# Patient Record
Sex: Male | Born: 1945 | Race: Black or African American | Hispanic: No | Smoking: Current every day smoker
Health system: Southern US, Community
[De-identification: ages and names within clinical notes are randomized; demographics above are authoritative.]

## PROBLEM LIST (undated history)

## (undated) DIAGNOSIS — B1920 Unspecified viral hepatitis C without hepatic coma: Secondary | ICD-10-CM

## (undated) DIAGNOSIS — F4310 Post-traumatic stress disorder, unspecified: Secondary | ICD-10-CM

## (undated) DIAGNOSIS — T884XXA Failed or difficult intubation, initial encounter: Secondary | ICD-10-CM

## (undated) DIAGNOSIS — C61 Malignant neoplasm of prostate: Secondary | ICD-10-CM

## (undated) DIAGNOSIS — C801 Malignant (primary) neoplasm, unspecified: Secondary | ICD-10-CM

## (undated) DIAGNOSIS — J439 Emphysema, unspecified: Secondary | ICD-10-CM

## (undated) DIAGNOSIS — K219 Gastro-esophageal reflux disease without esophagitis: Secondary | ICD-10-CM

## (undated) DIAGNOSIS — I10 Essential (primary) hypertension: Secondary | ICD-10-CM

## (undated) DIAGNOSIS — F1410 Cocaine abuse, uncomplicated: Secondary | ICD-10-CM

## (undated) HISTORY — PX: TRANSURETHRAL RESECTION OF PROSTATE: SHX73

## (undated) HISTORY — PX: LUMBAR SPINE SURGERY: SHX701

## (undated) HISTORY — PX: TONSILLECTOMY: SUR1361

---

## 1998-03-14 ENCOUNTER — Ambulatory Visit (HOSPITAL_COMMUNITY): Admission: RE | Admit: 1998-03-14 | Discharge: 1998-03-14 | Payer: Self-pay | Admitting: Neurosurgery

## 1998-04-12 ENCOUNTER — Ambulatory Visit (HOSPITAL_COMMUNITY): Admission: RE | Admit: 1998-04-12 | Discharge: 1998-04-12 | Payer: Self-pay | Admitting: Neurosurgery

## 1998-08-24 ENCOUNTER — Ambulatory Visit (HOSPITAL_COMMUNITY): Admission: RE | Admit: 1998-08-24 | Discharge: 1998-08-24 | Payer: Self-pay | Admitting: Neurosurgery

## 1998-08-24 ENCOUNTER — Encounter: Payer: Self-pay | Admitting: Neurosurgery

## 1998-10-10 ENCOUNTER — Inpatient Hospital Stay (HOSPITAL_COMMUNITY): Admission: RE | Admit: 1998-10-10 | Discharge: 1998-10-12 | Payer: Self-pay | Admitting: Neurosurgery

## 1998-10-10 ENCOUNTER — Encounter: Payer: Self-pay | Admitting: Neurosurgery

## 1998-11-08 ENCOUNTER — Ambulatory Visit (HOSPITAL_COMMUNITY): Admission: RE | Admit: 1998-11-08 | Discharge: 1998-11-08 | Payer: Self-pay | Admitting: Neurosurgery

## 1998-11-08 ENCOUNTER — Encounter: Payer: Self-pay | Admitting: Neurosurgery

## 1999-01-07 ENCOUNTER — Ambulatory Visit (HOSPITAL_COMMUNITY): Admission: RE | Admit: 1999-01-07 | Discharge: 1999-01-07 | Payer: Self-pay | Admitting: Neurosurgery

## 1999-01-07 ENCOUNTER — Encounter: Payer: Self-pay | Admitting: Neurosurgery

## 2009-08-20 ENCOUNTER — Emergency Department: Payer: Self-pay | Admitting: Emergency Medicine

## 2009-08-20 IMAGING — CR DG THORACIC SPINE 2-3V
1 series · 3 of 3 positions shown · non-contrast
Comparison: none

REASON FOR EXAM: neck and left scapular pain
COMMENTS:

PROCEDURE:     DXR - DXR THORACIC  AP AND LATERAL  - [DATE]  [DATE]
RESULT:     The vertebral body heights and the intervertebral disc spaces
are well maintained. The vertebral body alignment is normal. The pedicles
are bilaterally intact.

[Series 1: view not recorded · 0.17mm/px · 3 of 3 slices shown]
[im 1/3]
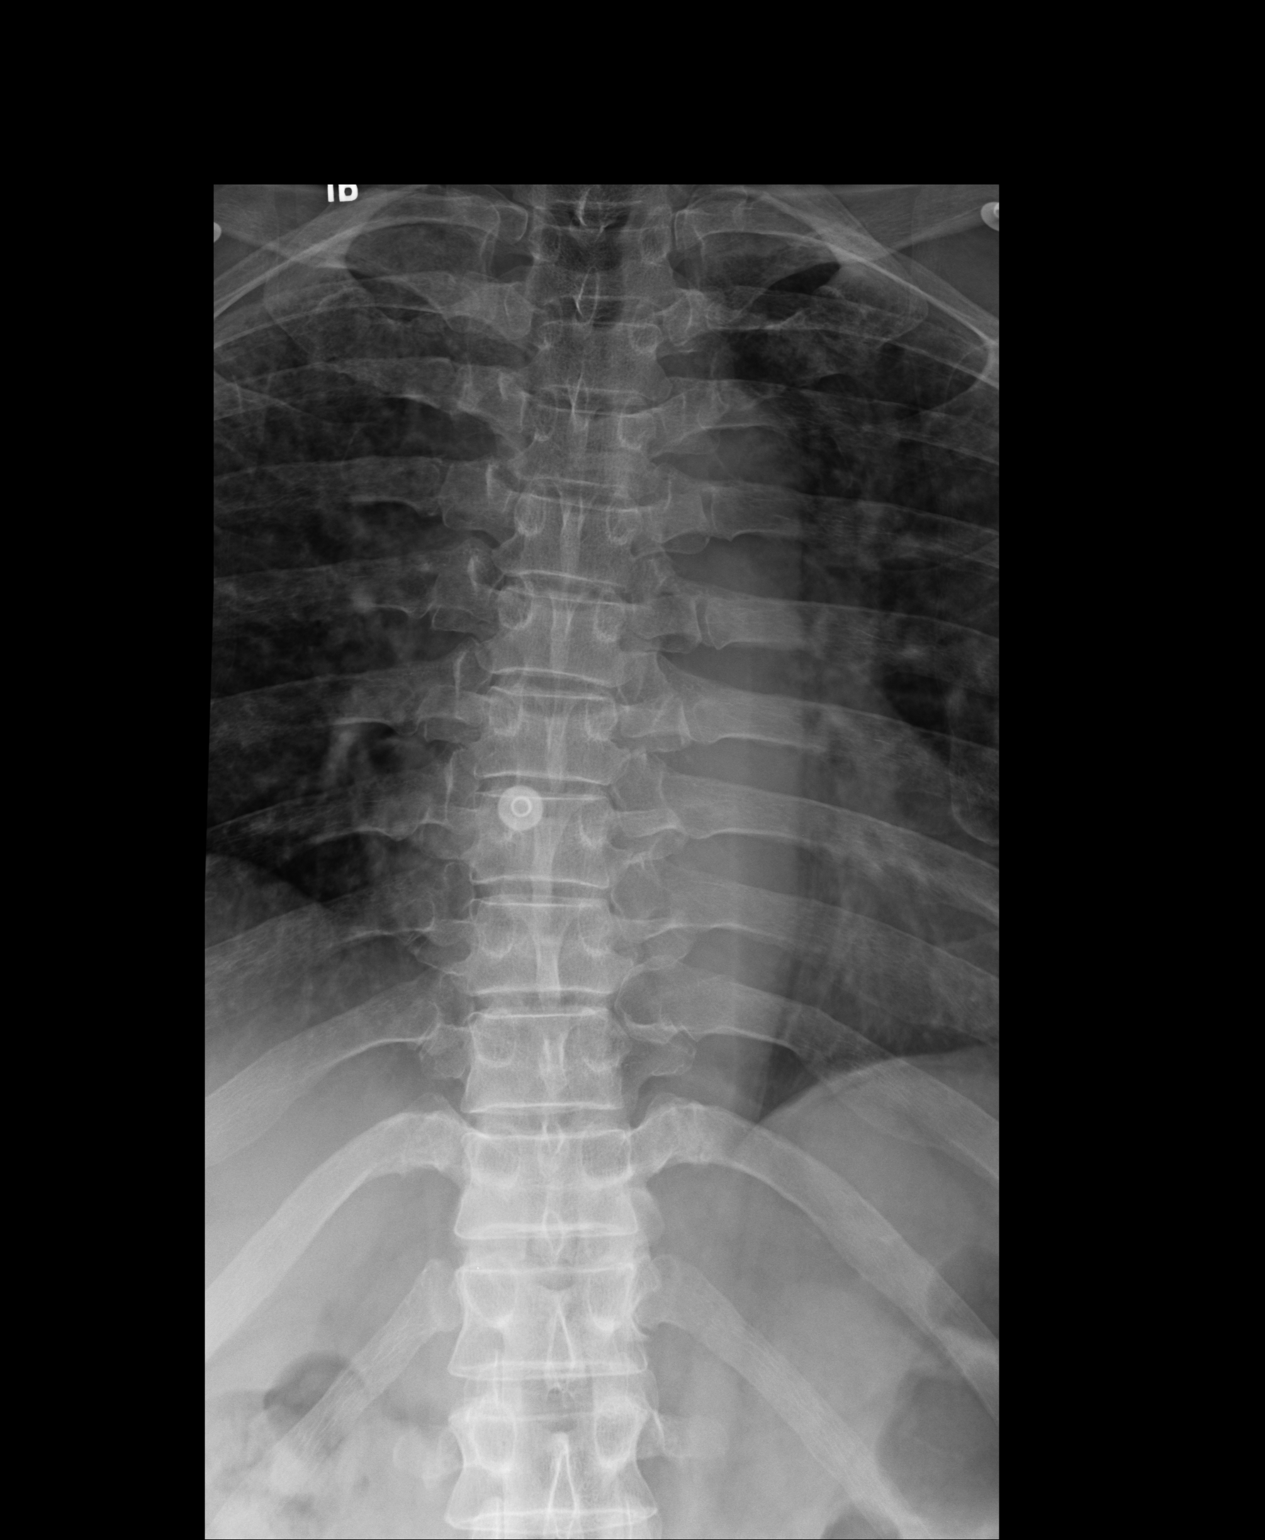
[im 2/3]
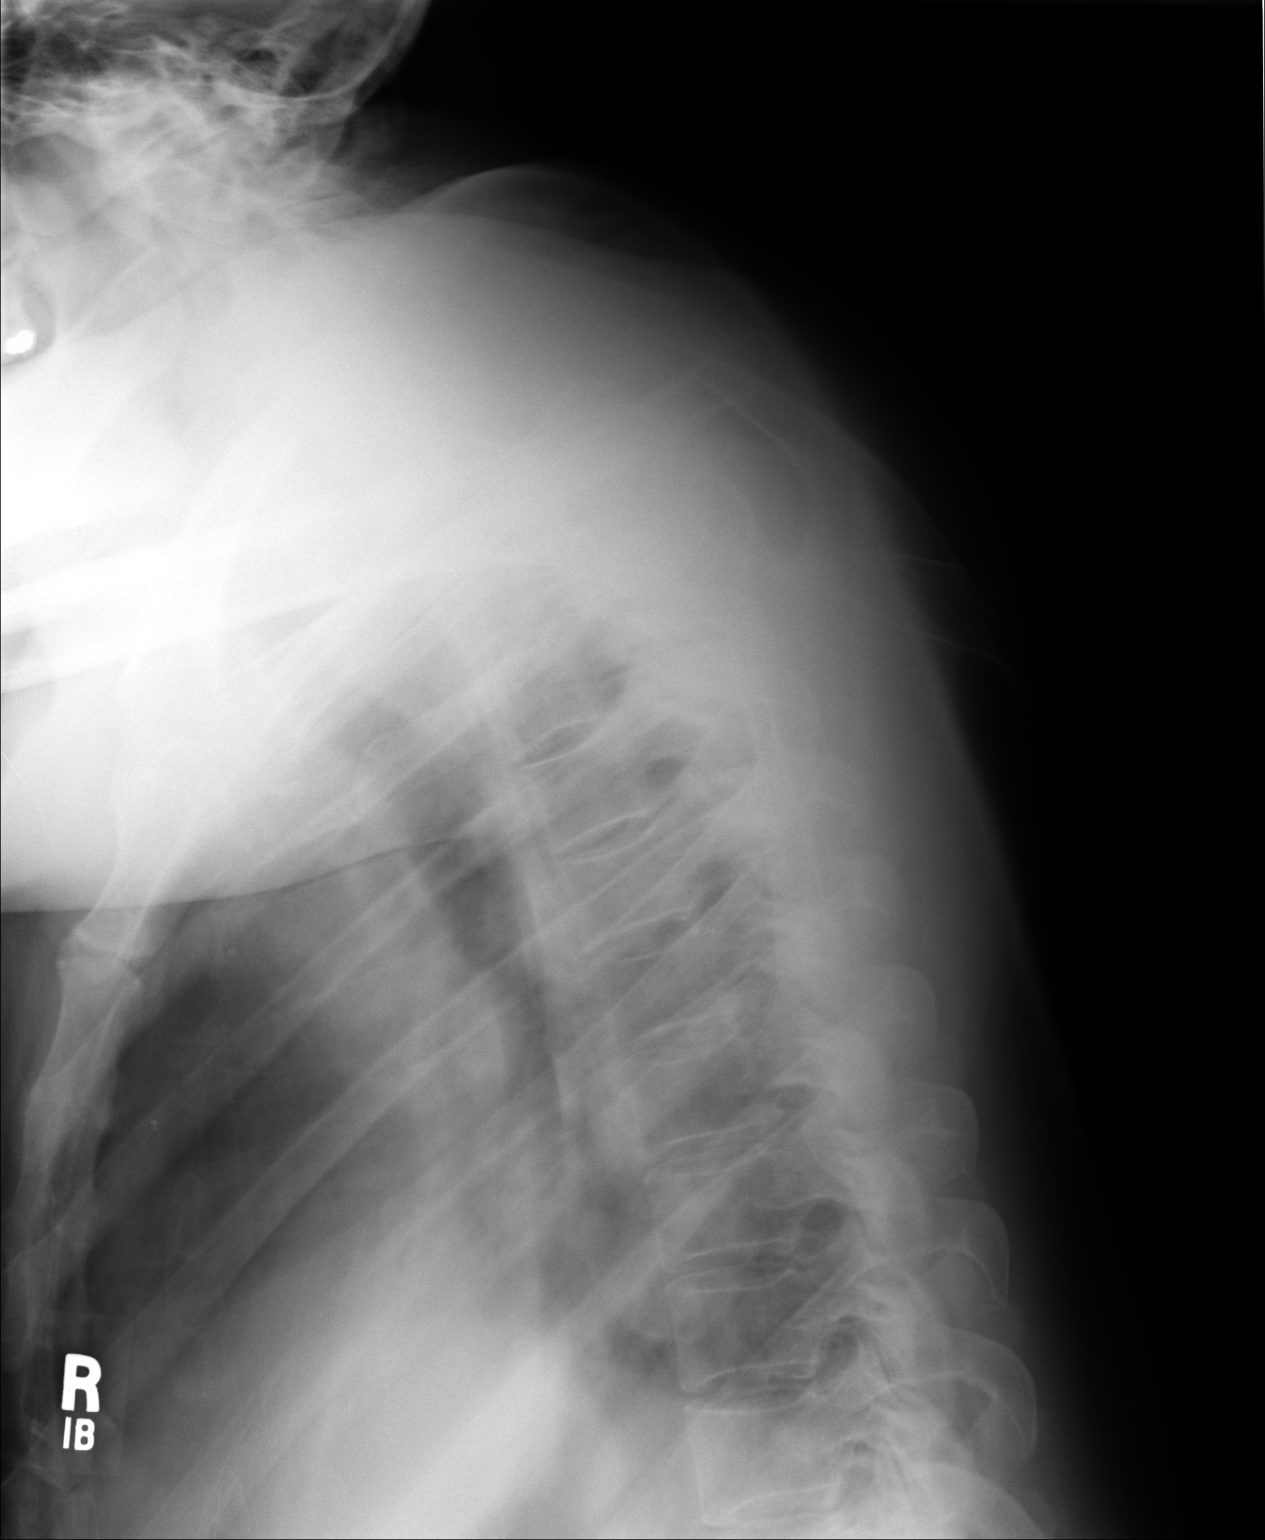
[im 3/3]
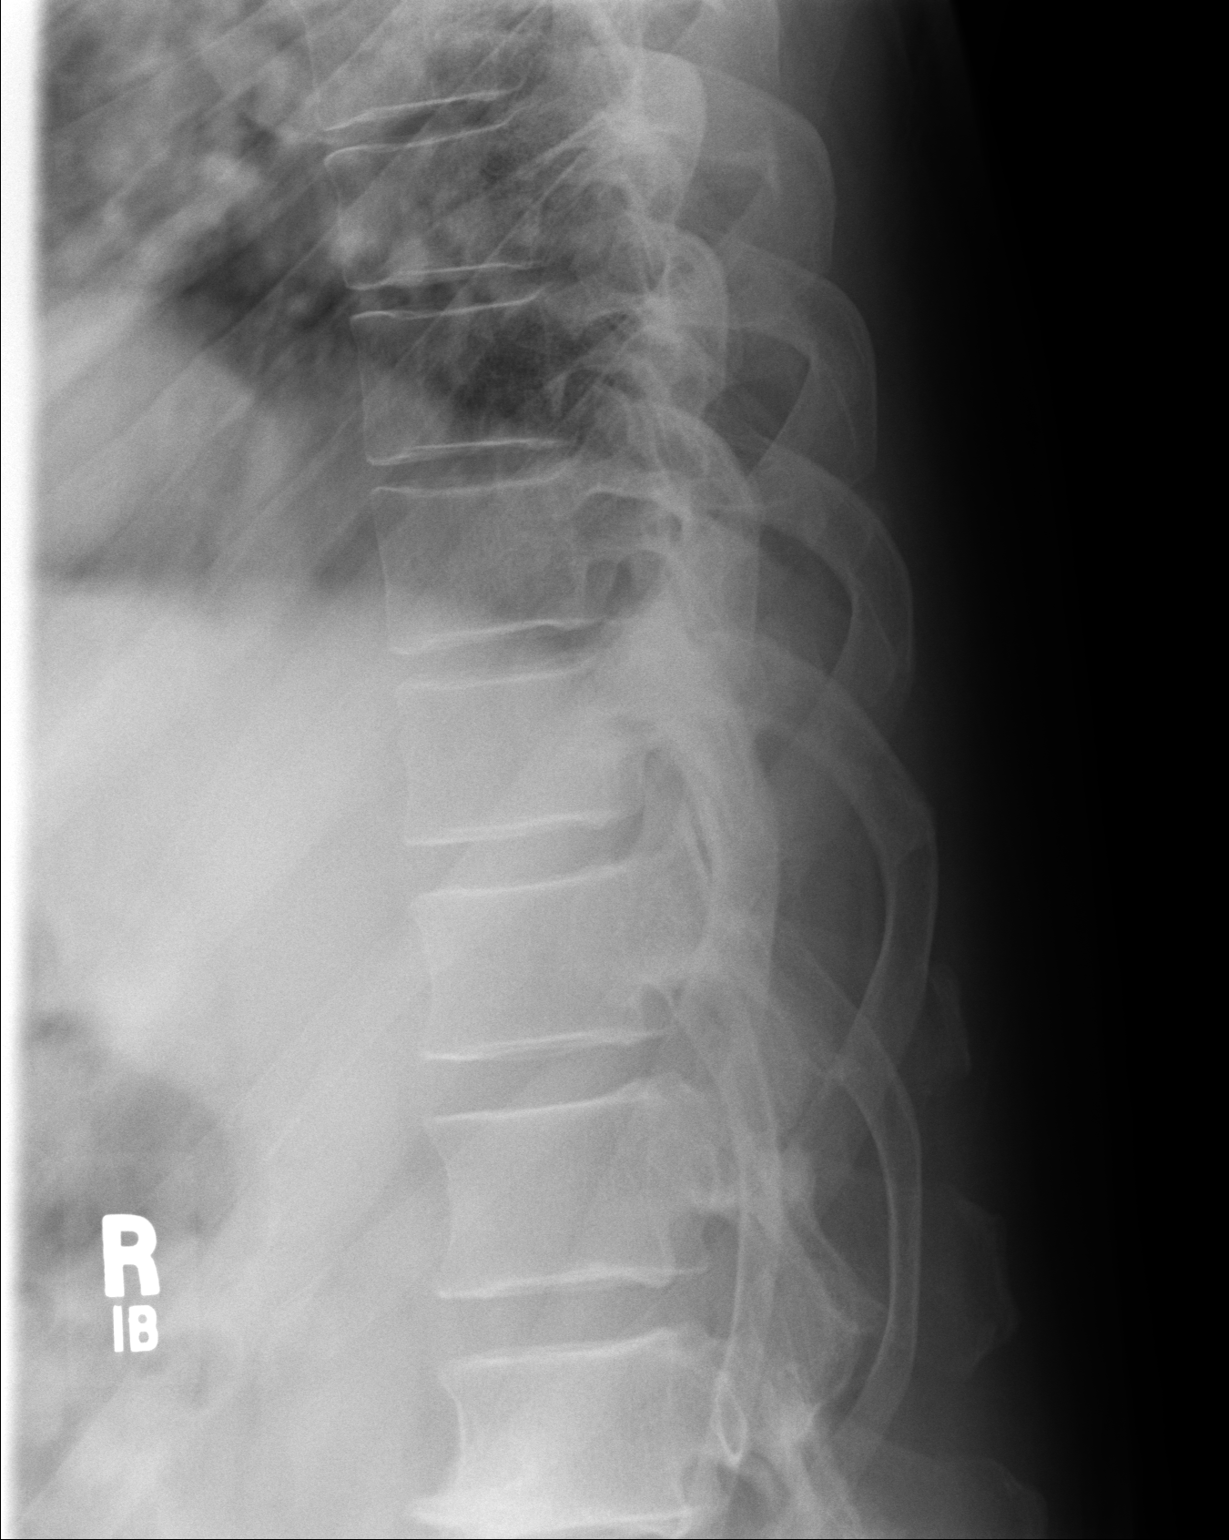

[3 of 3 positions shown; findings below may reference images not displayed]

IMPRESSION: No acute changes are identified.

## 2012-01-19 ENCOUNTER — Emergency Department: Payer: Self-pay | Admitting: Emergency Medicine

## 2012-10-26 ENCOUNTER — Emergency Department: Payer: Self-pay | Admitting: Emergency Medicine

## 2012-11-24 ENCOUNTER — Emergency Department: Payer: Self-pay | Admitting: Emergency Medicine

## 2012-11-24 LAB — URINALYSIS, COMPLETE
Bacteria: NONE SEEN
Nitrite: NEGATIVE
Protein: NEGATIVE
RBC,UR: 1 /HPF (ref 0–5)
Specific Gravity: 1.015 (ref 1.003–1.030)
Squamous Epithelial: NONE SEEN
WBC UR: 1 /HPF (ref 0–5)

## 2012-11-24 LAB — COMPREHENSIVE METABOLIC PANEL
Alkaline Phosphatase: 77 U/L (ref 50–136)
Anion Gap: 6 — ABNORMAL LOW (ref 7–16)
Calcium, Total: 8.3 mg/dL — ABNORMAL LOW (ref 8.5–10.1)
Co2: 25 mmol/L (ref 21–32)
Creatinine: 1.13 mg/dL (ref 0.60–1.30)
EGFR (African American): >60
EGFR (Non-African Amer.): >60
Glucose: 93 mg/dL (ref 65–99)
Osmolality: 282 (ref 275–301)
Potassium: 3.6 mmol/L (ref 3.5–5.1)
SGPT (ALT): 27 U/L (ref 12–78)
Total Protein: 7.6 g/dL (ref 6.4–8.2)

## 2012-11-24 LAB — DRUG SCREEN, URINE
Cannabinoid 50 Ng, Ur ~~LOC~~: NEGATIVE (ref ?–50)
Cocaine Metabolite,Ur ~~LOC~~: POSITIVE (ref ?–300)
MDMA (Ecstasy)Ur Screen: NEGATIVE (ref ?–500)
Methadone, Ur Screen: NEGATIVE (ref ?–300)
Opiate, Ur Screen: NEGATIVE (ref ?–300)
Phencyclidine (PCP) Ur S: NEGATIVE (ref ?–25)
Tricyclic, Ur Screen: NEGATIVE (ref ?–1000)

## 2012-11-24 LAB — ETHANOL: Ethanol %: <0.003 % (ref 0.000–0.080)

## 2012-11-24 LAB — TSH: Thyroid Stimulating Horm: 0.75 u[IU]/mL

## 2012-11-24 LAB — CBC
HGB: 13.5 g/dL (ref 13.0–18.0)
MCH: 27.3 pg (ref 26.0–34.0)
MCHC: 31.9 g/dL — ABNORMAL LOW (ref 32.0–36.0)
MCV: 86 fL (ref 80–100)
Platelet: 204 10*3/uL (ref 150–440)
RBC: 4.93 10*6/uL (ref 4.40–5.90)
RDW: 14.8 % — ABNORMAL HIGH (ref 11.5–14.5)

## 2012-11-24 LAB — SALICYLATE LEVEL: Salicylates, Serum: <1.7 mg/dL

## 2013-05-24 ENCOUNTER — Emergency Department: Payer: Self-pay | Admitting: Emergency Medicine

## 2013-05-24 LAB — COMPREHENSIVE METABOLIC PANEL
Albumin: 3.5 g/dL (ref 3.4–5.0)
Alkaline Phosphatase: 77 U/L (ref 50–136)
Anion Gap: 4 — ABNORMAL LOW (ref 7–16)
BUN: 21 mg/dL — ABNORMAL HIGH (ref 7–18)
Bilirubin,Total: 0.3 mg/dL (ref 0.2–1.0)
Calcium, Total: 8.9 mg/dL (ref 8.5–10.1)
Chloride: 104 mmol/L (ref 98–107)
Co2: 29 mmol/L (ref 21–32)
Creatinine: 1.08 mg/dL (ref 0.60–1.30)
EGFR (African American): >60
EGFR (Non-African Amer.): >60
Glucose: 85 mg/dL (ref 65–99)
Osmolality: 276 (ref 275–301)
Potassium: 3.9 mmol/L (ref 3.5–5.1)
SGOT(AST): 35 U/L (ref 15–37)
SGPT (ALT): 34 U/L (ref 12–78)
Sodium: 137 mmol/L (ref 136–145)
Total Protein: 7.7 g/dL (ref 6.4–8.2)

## 2013-05-24 LAB — URINALYSIS, COMPLETE
Bacteria: NONE SEEN
Bilirubin,UR: NEGATIVE
Blood: NEGATIVE
Glucose,UR: NEGATIVE mg/dL (ref 0–75)
Ketone: NEGATIVE
Leukocyte Esterase: NEGATIVE
Nitrite: NEGATIVE
Ph: 7 (ref 4.5–8.0)
Protein: NEGATIVE
RBC,UR: <1 /HPF (ref 0–5)
Specific Gravity: 1.014 (ref 1.003–1.030)
Squamous Epithelial: <1
WBC UR: NONE SEEN /HPF (ref 0–5)

## 2013-05-24 LAB — CBC
HCT: 41.3 % (ref 40.0–52.0)
HGB: 13.9 g/dL (ref 13.0–18.0)
MCH: 28.7 pg (ref 26.0–34.0)
MCHC: 33.6 g/dL (ref 32.0–36.0)
MCV: 85 fL (ref 80–100)
Platelet: 152 10*3/uL (ref 150–440)
RBC: 4.84 10*6/uL (ref 4.40–5.90)
RDW: 14.2 % (ref 11.5–14.5)
WBC: 8.1 10*3/uL (ref 3.8–10.6)

## 2013-05-24 LAB — TROPONIN I: Troponin-I: <0.02 ng/mL

## 2014-09-21 LAB — URINALYSIS, COMPLETE
BILIRUBIN, UR: NEGATIVE
Bacteria: NONE SEEN
Blood: NEGATIVE
Glucose,UR: NEGATIVE mg/dL (ref 0–75)
Ketone: NEGATIVE
Leukocyte Esterase: NEGATIVE
Nitrite: NEGATIVE
Ph: 6 (ref 4.5–8.0)
Protein: NEGATIVE
SQUAMOUS EPITHELIAL: NONE SEEN
Specific Gravity: 1.021 (ref 1.003–1.030)
WBC UR: 1 /HPF (ref 0–5)

## 2014-09-21 LAB — COMPREHENSIVE METABOLIC PANEL
ALT: 31 U/L
Albumin: 3.4 g/dL (ref 3.4–5.0)
Alkaline Phosphatase: 62 U/L
Anion Gap: 8 (ref 7–16)
BILIRUBIN TOTAL: 0.5 mg/dL (ref 0.2–1.0)
BUN: 17 mg/dL (ref 7–18)
Calcium, Total: 8.5 mg/dL (ref 8.5–10.1)
Chloride: 107 mmol/L (ref 98–107)
Co2: 26 mmol/L (ref 21–32)
Creatinine: 1.15 mg/dL (ref 0.60–1.30)
EGFR (African American): >60
EGFR (Non-African Amer.): >60
Glucose: 100 mg/dL — ABNORMAL HIGH (ref 65–99)
OSMOLALITY: 283 (ref 275–301)
Potassium: 3.9 mmol/L (ref 3.5–5.1)
SGOT(AST): 37 U/L (ref 15–37)
Sodium: 141 mmol/L (ref 136–145)
Total Protein: 8 g/dL (ref 6.4–8.2)

## 2014-09-21 LAB — DRUG SCREEN, URINE
AMPHETAMINES, UR SCREEN: NEGATIVE (ref ?–1000)
BENZODIAZEPINE, UR SCRN: NEGATIVE (ref ?–200)
Barbiturates, Ur Screen: NEGATIVE (ref ?–200)
COCAINE METABOLITE, UR ~~LOC~~: POSITIVE (ref ?–300)
Cannabinoid 50 Ng, Ur ~~LOC~~: NEGATIVE (ref ?–50)
MDMA (Ecstasy)Ur Screen: NEGATIVE (ref ?–500)
Methadone, Ur Screen: NEGATIVE (ref ?–300)
Opiate, Ur Screen: NEGATIVE (ref ?–300)
Phencyclidine (PCP) Ur S: NEGATIVE (ref ?–25)
Tricyclic, Ur Screen: NEGATIVE (ref ?–1000)

## 2014-09-21 LAB — CBC
HCT: 46.2 % (ref 40.0–52.0)
HGB: 15.1 g/dL (ref 13.0–18.0)
MCH: 28.2 pg (ref 26.0–34.0)
MCHC: 32.6 g/dL (ref 32.0–36.0)
MCV: 87 fL (ref 80–100)
Platelet: 208 10*3/uL (ref 150–440)
RBC: 5.34 10*6/uL (ref 4.40–5.90)
RDW: 14.7 % — ABNORMAL HIGH (ref 11.5–14.5)
WBC: 7.4 10*3/uL (ref 3.8–10.6)

## 2014-09-21 LAB — ACETAMINOPHEN LEVEL

## 2014-09-21 LAB — SALICYLATE LEVEL

## 2014-09-21 LAB — ETHANOL

## 2014-09-22 ENCOUNTER — Inpatient Hospital Stay: Payer: Self-pay | Admitting: Psychiatry

## 2014-10-25 DIAGNOSIS — F329 Major depressive disorder, single episode, unspecified: Secondary | ICD-10-CM | POA: Diagnosis not present

## 2014-10-25 DIAGNOSIS — F17210 Nicotine dependence, cigarettes, uncomplicated: Secondary | ICD-10-CM | POA: Diagnosis not present

## 2014-10-25 DIAGNOSIS — G8929 Other chronic pain: Secondary | ICD-10-CM | POA: Diagnosis not present

## 2014-10-25 DIAGNOSIS — T1491 Suicide attempt: Secondary | ICD-10-CM | POA: Diagnosis not present

## 2014-10-25 DIAGNOSIS — F1210 Cannabis abuse, uncomplicated: Secondary | ICD-10-CM | POA: Diagnosis not present

## 2014-10-25 DIAGNOSIS — I10 Essential (primary) hypertension: Secondary | ICD-10-CM | POA: Diagnosis not present

## 2014-10-25 DIAGNOSIS — G894 Chronic pain syndrome: Secondary | ICD-10-CM | POA: Diagnosis not present

## 2014-10-25 DIAGNOSIS — T405X2A Poisoning by cocaine, intentional self-harm, initial encounter: Secondary | ICD-10-CM | POA: Diagnosis not present

## 2014-10-25 DIAGNOSIS — R001 Bradycardia, unspecified: Secondary | ICD-10-CM | POA: Diagnosis not present

## 2014-10-25 DIAGNOSIS — F14280 Cocaine dependence with cocaine-induced anxiety disorder: Secondary | ICD-10-CM | POA: Diagnosis not present

## 2014-10-25 DIAGNOSIS — Z9119 Patient's noncompliance with other medical treatment and regimen: Secondary | ICD-10-CM | POA: Diagnosis not present

## 2014-10-25 DIAGNOSIS — G4700 Insomnia, unspecified: Secondary | ICD-10-CM | POA: Diagnosis not present

## 2014-10-25 DIAGNOSIS — R45851 Suicidal ideations: Secondary | ICD-10-CM | POA: Diagnosis not present

## 2014-10-25 DIAGNOSIS — K219 Gastro-esophageal reflux disease without esophagitis: Secondary | ICD-10-CM | POA: Diagnosis not present

## 2014-10-25 DIAGNOSIS — F39 Unspecified mood [affective] disorder: Secondary | ICD-10-CM | POA: Diagnosis not present

## 2014-10-25 DIAGNOSIS — F4310 Post-traumatic stress disorder, unspecified: Secondary | ICD-10-CM | POA: Diagnosis not present

## 2014-10-25 DIAGNOSIS — G629 Polyneuropathy, unspecified: Secondary | ICD-10-CM | POA: Diagnosis not present

## 2014-10-25 DIAGNOSIS — F1419 Cocaine abuse with unspecified cocaine-induced disorder: Secondary | ICD-10-CM | POA: Diagnosis not present

## 2015-01-19 DIAGNOSIS — R001 Bradycardia, unspecified: Secondary | ICD-10-CM | POA: Diagnosis not present

## 2015-01-19 DIAGNOSIS — F1410 Cocaine abuse, uncomplicated: Secondary | ICD-10-CM | POA: Diagnosis not present

## 2015-01-19 DIAGNOSIS — Z79899 Other long term (current) drug therapy: Secondary | ICD-10-CM | POA: Diagnosis not present

## 2015-01-19 DIAGNOSIS — Z9119 Patient's noncompliance with other medical treatment and regimen: Secondary | ICD-10-CM | POA: Diagnosis not present

## 2015-01-19 DIAGNOSIS — F1210 Cannabis abuse, uncomplicated: Secondary | ICD-10-CM | POA: Diagnosis not present

## 2015-01-19 DIAGNOSIS — I10 Essential (primary) hypertension: Secondary | ICD-10-CM | POA: Diagnosis not present

## 2015-01-19 DIAGNOSIS — F4310 Post-traumatic stress disorder, unspecified: Secondary | ICD-10-CM | POA: Diagnosis not present

## 2015-01-19 DIAGNOSIS — F329 Major depressive disorder, single episode, unspecified: Secondary | ICD-10-CM | POA: Diagnosis not present

## 2015-01-19 DIAGNOSIS — G629 Polyneuropathy, unspecified: Secondary | ICD-10-CM | POA: Diagnosis not present

## 2015-01-19 DIAGNOSIS — G8929 Other chronic pain: Secondary | ICD-10-CM | POA: Diagnosis not present

## 2015-01-19 DIAGNOSIS — F17210 Nicotine dependence, cigarettes, uncomplicated: Secondary | ICD-10-CM | POA: Diagnosis not present

## 2015-01-19 DIAGNOSIS — I444 Left anterior fascicular block: Secondary | ICD-10-CM | POA: Diagnosis not present

## 2015-02-03 NOTE — Consult Note (Signed)
PATIENT NAME:  Timothy Matthews, Timothy Matthews MR#:  [id number] DATE OF BIRTH:  [birth year]  DATE OF CONSULTATION:  09/21/2014  IDENTIFYING INFORMATION AND REASON FOR CONSULTATION: A 69 year old man with a history of cocaine abuse who voluntarily came into the hospital.   CHIEF COMPLAINT: "I've been feeling down."   HISTORY OF PRESENT ILLNESS: Information obtained from the patient and the chart. The patient says that he had been using cocaine for several days straight. His mood has been down and depressed. He has not been able to sleep and not been able to eat. He has been having suicidal thoughts and thought seriously about walking out in front of traffic today. He said he walked 6 miles to come here to the hospital. He feels overwhelmed and negative about himself. Has no support for himself, no place to stay. He is not currently on any treatment for depression. Denies any alcohol abuse.   PAST PSYCHIATRIC HISTORY: He has been evaluated at the Emergency Room here before, but not admitted to the hospital. He says he has been to multiple detoxes including the alcohol and drug abuse treatment center and several [facility]. He says he has never been in a psychiatric hospital for something other than substance abuse related. Never attempted to kill himself but he does have a history of serious suicidal ideation and playing around with the idea of getting hit by a vehicle. No homicidal history. He denies any history of psychotic symptoms.   PAST MEDICAL HISTORY: High blood pressure, also apparently has gastric reflux symptoms, and prostate hypertrophy.   SOCIAL HISTORY: Divorced. Minimal family contact. Closest relative is a sister with whom he does not communicate. He gets a check from the [place] but says he does not have any personal support from anyone. He is a Geologist, engineering.   FAMILY HISTORY: No known family history of mental illness.   CURRENT MEDICATIONS: Amlodipine 10 mg per day, gabapentin 900 mg twice a day,  omeprazole 20 mg a day, Hytrin 20 mg at night.   ALLERGIES: No known drug allergies.   REVIEW OF SYSTEMS: The patient denies any pulmonary or cardiac or GI complaints other than not being hungry recently. He has chronic mild low back pain. Positive suicidal ideation. No hallucinations. No homicidal ideation. Feels depressed.   MENTAL STATUS EXAMINATION: Slightly disheveled man, looks his stated age. Passively cooperative. Eye contact poor. Psychomotor activity slow and sluggish. Speech is quiet, almost whispered, decreased in amount. Affect is blunted and dysphoric. Mood stated as depressed. Thoughts are lucid but slow. No evidence of delusional thinking. Denies auditory or visual hallucinations. Endorses suicidal ideation. No homicidal ideation. Can repeat 3/3 objects immediately, remembers 3/3 at 3 minutes. He is alert and oriented x 4. Judgment and insight adequate. Intelligence appears to be normal.   VITAL SIGNS: Blood pressure his 149/93, respirations 20, pulse 67, temperature 98.4.   LABORATORY RESULTS: Drug screen is positive for cocaine only. Alcohol level negative. Chemistry panel unremarkable. CBC totally normal. Urinalysis unremarkable.   ASSESSMENT: A 69 year old man who is currently reporting multiple symptoms of major depression including depressed mood, poor sleep, poor appetite, persistent negative thoughts about himself, and active suicidal ideation with a specific plan. He does not have any psychotic symptoms. All of this is in the context of major life stresses including heavy recent cocaine use, homelessness, lack of support. The patient is a service-connected veteran but meets criteria for inpatient treatment.   TREATMENT PLAN: [facility].  The patient will be admitted voluntarily to the psychiatry hospital here for treatment of major depression. Continue usual outpatient medications. Initiate Remeron 15 mg at night for mood and  sleep. Suicide precautions in place.   DIAGNOSIS, PRINCIPAL AND PRIMARY:  AXIS I: Major depression, recurrent, severe.   SECONDARY DIAGNOSES: AXIS I: Cocaine abuse, severe.  AXIS II: Deferred.  AXIS III: High blood pressure, gastric reflux symptoms, prostate hypertrophy.     ____________________________ Gonzella Lex, MD jtc:at D: 09/21/2014 18:09:00 ET T: 09/21/2014 18:18:00 ET JOB#: [id number]  cc: Gonzella Lex, MD, <Dictator> Gonzella Lex MD ELECTRONICALLY SIGNED 09/24/2014 11:24

## 2015-02-03 NOTE — H&P (Signed)
PATIENT NAME:  Timothy Matthews, Timothy Matthews MR#:  [id number] DATE OF BIRTH:  1946/05/30  DATE OF ADMISSION:  09/22/2014  IDENTIFYING INFORMATION AND REASON FOR CONSULTATION: A 69 year old man with a history of cocaine abuse and depression who came to the hospital seeking treatment for mood.   HISTORY OF PRESENT ILLNESS: Information obtained from the patient and the chart. He came to the hospital stating that he had been using cocaine for several days straight. He describes his mood as being extremely down. He was having active suicidal thoughts, with thoughts about walking out in front of traffic. Not eating or sleeping for days. Feeling overwhelmed and negative about himself. Does not report acute psychotic symptoms. Not abusing alcohol, not getting any psychiatric treatment.   PAST PSYCHIATRIC HISTORY: Has been evaluated in the Emergency Room in the past, but has not had admission to our hospital, but has had detox's at [facility]. Says he has never been in a hospital psychiatrically for anything but substance abuse. No history of suicide attempts.   PAST MEDICAL HISTORY: High blood pressure, gastric reflux symptoms, enlarged prostate.   SOCIAL HISTORY: Divorced, has minimal family contact. Gets a check from the [place], but does not have any personal support from anyone.   FAMILY HISTORY: No known family history of mental illness.   CURRENT MEDICATIONS: Amlodipine 10 mg a day, gabapentin 900 mg twice a day, omeprazole 20 mg a day, Hytrin 20 mg at night.   ALLERGIES: No known drug allergies.   SUBSTANCE ABUSE HISTORY: Long history of cocaine dependence. He says he has been able to have some sobriety at times, but not recently very much.   REVIEW OF SYSTEMS: Today, the patient says he is still feeling depressed. Says he still has occasional suicidal thoughts. Affect remains blunted. Physically, he has no new complaints.   MENTAL STATUS EXAMINATION: Casually groomed gentleman who looks his stated age.  Cooperative, but passive with the interview. Eye contact only intermittent. Psychomotor activity slow. Speech decreased in total amount. Affect grumpy and irritable. Mood stated as being depressed. Thoughts are lucid. No evidence of delusions. Denies auditory or visual hallucinations. Endorses passive suicidal thoughts with no plan. No homicidal ideation. Alert and oriented x 4. Can remember 3/3 objects immediately; 3/3 at 3 minutes. Judgment and insight adequate. Intelligence normal.   PHYSICAL EXAMINATION:  GENERAL: The patient appears to be in no acute physical distress.  SKIN: No skin lesions identified. EXTREMITIES: Gait is normal. Moves all extremities normally.  HEENT: No facial asymmetry.  NEUROLOGIC: Strength and reflexes normal and symmetric throughout upper and lower. Cranial nerves symmetric and normal.  LUNGS: Clear, without wheezes.  HEART: Regular rate and rhythm.  ABDOMEN: Soft, nontender, normal bowel sounds. Temperature 97.7, pulse 62, respirations 18, blood pressure 123/75.   LABORATORY RESULTS: Salicylates and acetaminophen negative. Drug screen positive for cocaine. Urinalysis unremarkable. Chemistry panel and CBC all unremarkable.   ASSESSMENT: A 69 year old man with cocaine abuse and major depression, presented stating he was having suicidal ideation. Remains dysphoric and depressed with a grumpy attitude and reports of passive suicidal ideation, but has not engaged in any dangerous behavior here in the hospital. Mood is still stated as being depressed. The patient was admitted to the hospital for suicidality.   TREATMENT PLAN: I am going to increase the mirtazapine to 30 mg at night for depression. Continue his pantoprazole, Hytrin, gabapentin and amlodipine. Counseling and psychoeducation done. Strongly encourage attendance at groups and involvement on the unit.   DIAGNOSIS, PRINCIPAL AND  PRIMARY: Major depression, severe, single.   SECONDARY DIAGNOSES:  AXIS I: Cocaine  abuse, severe.  AXIS II: Deferred.  AXIS III: High blood pressure, chronic pain.    ____________________________ Gonzella Lex, MD jtc:MT D: 09/23/2014 13:01:23 ET T: 09/23/2014 13:11:56 ET JOB#: [id number]  cc: Gonzella Lex, MD, <Dictator> Gonzella Lex MD ELECTRONICALLY SIGNED 09/24/2014 11:26

## 2015-03-23 DIAGNOSIS — F1914 Other psychoactive substance abuse with psychoactive substance-induced mood disorder: Secondary | ICD-10-CM | POA: Diagnosis not present

## 2015-03-23 DIAGNOSIS — F1490 Cocaine use, unspecified, uncomplicated: Secondary | ICD-10-CM | POA: Diagnosis not present

## 2015-03-24 DIAGNOSIS — F1914 Other psychoactive substance abuse with psychoactive substance-induced mood disorder: Secondary | ICD-10-CM | POA: Diagnosis not present

## 2015-04-03 DIAGNOSIS — F1914 Other psychoactive substance abuse with psychoactive substance-induced mood disorder: Secondary | ICD-10-CM | POA: Diagnosis not present

## 2015-04-04 DIAGNOSIS — J984 Other disorders of lung: Secondary | ICD-10-CM | POA: Diagnosis not present

## 2015-05-22 DIAGNOSIS — I499 Cardiac arrhythmia, unspecified: Secondary | ICD-10-CM | POA: Diagnosis not present

## 2015-05-22 DIAGNOSIS — F1414 Cocaine abuse with cocaine-induced mood disorder: Secondary | ICD-10-CM | POA: Diagnosis not present

## 2015-05-25 DIAGNOSIS — F329 Major depressive disorder, single episode, unspecified: Secondary | ICD-10-CM | POA: Diagnosis not present

## 2015-05-25 DIAGNOSIS — F1490 Cocaine use, unspecified, uncomplicated: Secondary | ICD-10-CM | POA: Diagnosis not present

## 2015-05-25 DIAGNOSIS — R45851 Suicidal ideations: Secondary | ICD-10-CM | POA: Diagnosis not present

## 2015-10-14 HISTORY — PX: CERVICAL SPINE SURGERY: SHX589

## 2016-06-05 ENCOUNTER — Emergency Department: Payer: Medicare Other

## 2016-06-05 DIAGNOSIS — K5900 Constipation, unspecified: Secondary | ICD-10-CM | POA: Diagnosis not present

## 2016-06-05 DIAGNOSIS — R309 Painful micturition, unspecified: Secondary | ICD-10-CM | POA: Diagnosis present

## 2016-06-05 DIAGNOSIS — I10 Essential (primary) hypertension: Secondary | ICD-10-CM | POA: Diagnosis not present

## 2016-06-05 DIAGNOSIS — Z8546 Personal history of malignant neoplasm of prostate: Secondary | ICD-10-CM | POA: Diagnosis not present

## 2016-06-05 DIAGNOSIS — N410 Acute prostatitis: Secondary | ICD-10-CM | POA: Diagnosis not present

## 2016-06-05 DIAGNOSIS — K4090 Unilateral inguinal hernia, without obstruction or gangrene, not specified as recurrent: Secondary | ICD-10-CM | POA: Diagnosis not present

## 2016-06-05 LAB — URINALYSIS COMPLETE WITH MICROSCOPIC (ARMC ONLY)
BACTERIA UA: NONE SEEN
BILIRUBIN URINE: NEGATIVE
Glucose, UA: NEGATIVE mg/dL
HGB URINE DIPSTICK: NEGATIVE
KETONES UR: NEGATIVE mg/dL
LEUKOCYTES UA: NEGATIVE
NITRITE: NEGATIVE
PH: 8 (ref 5.0–8.0)
Protein, ur: NEGATIVE mg/dL
SPECIFIC GRAVITY, URINE: 1.014 (ref 1.005–1.030)

## 2016-06-05 NOTE — ED Triage Notes (Signed)
Pt has not had BM x 3 days and also having burning on urination x 3 days, denies any discharge from penis.

## 2016-06-06 ENCOUNTER — Emergency Department: Payer: Medicare Other

## 2016-06-06 ENCOUNTER — Emergency Department
Admission: EM | Admit: 2016-06-06 | Discharge: 2016-06-06 | Disposition: A | Discharge status: Home / Self Care | Payer: Medicare Other | Attending: Emergency Medicine | Admitting: Emergency Medicine

## 2016-06-06 DIAGNOSIS — N410 Acute prostatitis: Secondary | ICD-10-CM

## 2016-06-06 DIAGNOSIS — K4090 Unilateral inguinal hernia, without obstruction or gangrene, not specified as recurrent: Secondary | ICD-10-CM | POA: Diagnosis not present

## 2016-06-06 HISTORY — DX: Essential (primary) hypertension: I10

## 2016-06-06 HISTORY — DX: Malignant (primary) neoplasm, unspecified: C80.1

## 2016-06-06 LAB — CBC
HCT: 37.3 % — ABNORMAL LOW (ref 40.0–52.0)
HEMOGLOBIN: 12.4 g/dL — AB (ref 13.0–18.0)
MCH: 28.5 pg (ref 26.0–34.0)
MCHC: 33.3 g/dL (ref 32.0–36.0)
MCV: 85.7 fL (ref 80.0–100.0)
Platelets: 175 10*3/uL (ref 150–440)
RBC: 4.36 MIL/uL — ABNORMAL LOW (ref 4.40–5.90)
RDW: 15.1 % — AB (ref 11.5–14.5)
WBC: 7.6 10*3/uL (ref 3.8–10.6)

## 2016-06-06 LAB — COMPREHENSIVE METABOLIC PANEL
ALBUMIN: 3.4 g/dL — AB (ref 3.5–5.0)
ALT: 11 U/L — ABNORMAL LOW (ref 17–63)
ANION GAP: 7 (ref 5–15)
AST: 19 U/L (ref 15–41)
Alkaline Phosphatase: 65 U/L (ref 38–126)
BUN: 14 mg/dL (ref 6–20)
CO2: 23 mmol/L (ref 22–32)
Calcium: 8.7 mg/dL — ABNORMAL LOW (ref 8.9–10.3)
Chloride: 105 mmol/L (ref 101–111)
Creatinine, Ser: 1.04 mg/dL (ref 0.61–1.24)
GFR calc Af Amer: >60 mL/min (ref >60)
GFR calc non Af Amer: >60 mL/min (ref >60)
GLUCOSE: 92 mg/dL (ref 65–99)
POTASSIUM: 3.7 mmol/L (ref 3.5–5.1)
SODIUM: 135 mmol/L (ref 135–145)
Total Bilirubin: 0.3 mg/dL (ref 0.3–1.2)
Total Protein: 7.4 g/dL (ref 6.5–8.1)

## 2016-06-06 MED ORDER — MORPHINE SULFATE (PF) 4 MG/ML IV SOLN
INTRAVENOUS | Status: AC
  Administered 2016-06-06: 4 mg via INTRAVENOUS
  Filled 2016-06-06: qty 1

## 2016-06-06 MED ORDER — OXYCODONE-ACETAMINOPHEN 5-325 MG PO TABS: 1.0000 | ORAL_TABLET | ORAL | 0 refills | Status: DC | PRN

## 2016-06-06 MED ORDER — MAGNESIUM CITRATE PO SOLN
1.0000 | Freq: Once | ORAL | Status: AC
Start: 2016-06-06 — End: 2016-06-06
  Administered 2016-06-06: 1 via ORAL
  Filled 2016-06-06: qty 296

## 2016-06-06 MED ORDER — MORPHINE SULFATE (PF) 4 MG/ML IV SOLN
4.0000 mg | Freq: Once | INTRAVENOUS | Status: AC
  Administered 2016-06-06: 4 mg via INTRAVENOUS

## 2016-06-06 MED ORDER — SULFAMETHOXAZOLE-TRIMETHOPRIM 800-160 MG PO TABS: 1.0000 | ORAL_TABLET | Freq: Two times a day (BID) | ORAL | 0 refills | Status: AC

## 2016-06-06 MED ORDER — KETOROLAC TROMETHAMINE 30 MG/ML IJ SOLN
30.0000 mg | Freq: Once | INTRAMUSCULAR | Status: AC
  Administered 2016-06-06: 30 mg via INTRAVENOUS
  Filled 2016-06-06: qty 1

## 2016-06-06 MED ORDER — DIATRIZOATE MEGLUMINE & SODIUM 66-10 % PO SOLN
15.0000 mL | Freq: Once | ORAL | Status: AC
  Administered 2016-06-06: 15 mL via ORAL

## 2016-06-06 MED ORDER — FLEET ENEMA 7-19 GM/118ML RE ENEM
1.0000 | ENEMA | Freq: Once | RECTAL | Status: AC
  Administered 2016-06-06: 1 via RECTAL

## 2016-06-06 MED ORDER — IOPAMIDOL (ISOVUE-300) INJECTION 61%
100.0000 mL | Freq: Once | INTRAVENOUS | Status: AC | PRN
  Administered 2016-06-06: 100 mL via INTRAVENOUS

## 2016-06-06 NOTE — ED Notes (Signed)
Discharge instructions reviewed with patient. Questions fielded by this RN. Patient verbalizes understanding of instructions. Patient discharged home in stable condition per Brown MD . No acute distress noted at time of discharge.   

## 2016-06-06 NOTE — ED Provider Notes (Signed)
[facility] Emergency Department Provider Note _   First MD Initiated Contact with Patient 06/06/16 [phone]     (approximate)  I have reviewed the triage vital signs and the nursing notes.   HISTORY  Chief Complaint Urinary Urgency    HPI Timothy Matthews is a 70 y.o. male with history of prostate cancer status post radiation therapy 3 weeks ago presents with 10 out of 10 burning with urination 3 days. Patient also admits to constipation 3 days. Patient denies any fever afebrile on presentation with temperature 98.3. Patient denies any vomiting.   Past Medical History:  Diagnosis Date  . Cancer ([place])   . Hypertension     There are no active problems to display for this patient.   No past surgical history on file.  Prior to Admission medications   Not on File    Allergies Review of patient's allergies indicates no known allergies.  No family history on file.  Social History Social History  Substance Use Topics  . Smoking status: Not on file  . Smokeless tobacco: Not on file  . Alcohol use Not on file    Review of Systems Constitutional: No fever/chills Eyes: No visual changes. ENT: No sore throat. Cardiovascular: Denies chest pain. Respiratory: Denies shortness of breath. Gastrointestinal: Positive for abdominal pain  No nausea, no vomiting.  No diarrhea.  No constipation. Genitourinary: Negative for dysuria. Musculoskeletal: Negative for back pain. Skin: Negative for rash. Neurological: Negative for headaches, focal weakness or numbness.  10-point ROS otherwise negative.  ____________________________________________   PHYSICAL EXAM:  VITAL SIGNS: ED Triage Vitals  Enc Vitals Group     BP 06/05/16 2311 (!) 167/92     Pulse Rate 06/05/16 2311 64     Resp 06/05/16 2311 18     Temp 06/05/16 2311 98.3 F (36.8 C)     Temp Source 06/05/16 2311 Oral     SpO2 06/05/16 2311 98 %     Weight 06/05/16 2311 180 lb (81.6 kg)    Height 06/05/16 2311 5\' 11"  (1.803 m)     Head Circumference --      Peak Flow --      Pain Score 06/05/16 2312 10     Pain Loc --      Pain Edu? --      Excl. in [place]? --     Constitutional: Alert and oriented. Well appearing and in no acute distress. Eyes: Conjunctivae are normal. PERRL. EOMI. Head: Atraumatic. Mouth/Throat: Mucous membranes are moist.  Oropharynx non-erythematous. Neck: No stridor.  No meningeal signs.  Cardiovascular: Normal rate, regular rhythm. Good peripheral circulation. Grossly normal heart sounds. Respiratory: Normal respiratory effort.  No retractions. Lungs CTAB. Gastrointestinal: Super pubic tenderness to palpation.. No distention. Rectal exam revealed tenderness to palpation of the prostate. Musculoskeletal: No lower extremity tenderness nor edema. No gross deformities of extremities. Neurologic:  Normal speech and language. No gross focal neurologic deficits are appreciated.  Skin:  Skin is warm, dry and intact. No rash noted. Psychiatric: Mood and affect are normal. Speech and behavior are normal.  ____________________________________________   LABS (all labs ordered are listed, but only abnormal results are displayed)  Labs Reviewed  URINALYSIS COMPLETEWITH MICROSCOPIC ([facility] ONLY) - Abnormal; Notable for the following:       Result Value   Color, Urine YELLOW (*)    APPearance HAZY (*)    Squamous Epithelial / LPF 0-5 (*)    All other components within normal limits  CBC - Abnormal; Notable for the following:    RBC 4.36 (*)    Hemoglobin 12.4 (*)    HCT 37.3 (*)    RDW 15.1 (*)    All other components within normal limits  COMPREHENSIVE METABOLIC PANEL - Abnormal; Notable for the following:    Calcium 8.7 (*)    Albumin 3.4 (*)    ALT 11 (*)    All other components within normal limits     RADIOLOGY I, Keyes N Trella Thurmond, personally viewed and evaluated these images (plain radiographs) as part of my medical decision making, as well as  reviewing the written report by the radiologist.  Dg Abdomen 1 View  Result Date: 06/05/2016 CLINICAL DATA:  Constipation EXAM: ABDOMEN - 1 VIEW COMPARISON:  None. FINDINGS: Moderate stool volume without rectal impaction or obstruction. No concerning mass effect or calcification. Cardiomegaly and interstitial coarsening that is chronic appearing. Lumbar disc degeneration with lower lumbar fusion. IMPRESSION: Moderate stool volume without obstruction or impaction. Electronically Signed   By: Monte Fantasia M.D.   On: 06/05/2016 23:42   Ct Abdomen Pelvis W Contrast  Result Date: 06/06/2016 CLINICAL DATA:  70 y/o M; no bowel movement for 3 days. Burning on urination for 3 days. History of appendectomy and prostate cancer with radiation therapy. Left lower quadrant pelvic pain. EXAM: CT ABDOMEN AND PELVIS WITH CONTRAST TECHNIQUE: Multidetector CT imaging of the abdomen and pelvis was performed using the standard protocol following bolus administration of intravenous contrast. CONTRAST:  136mL ISOVUE-300 IOPAMIDOL (ISOVUE-300) INJECTION 61% COMPARISON:  None. FINDINGS: Lower chest: Partially visualized ill-defined opacity in the posterior right lower lobe. Hepatobiliary: No masses or other significant abnormality. Pancreas: No mass, inflammatory changes, or other significant abnormality. Spleen: Within normal limits in size and appearance. Adrenals/Urinary Tract: No masses identified. No evidence of hydronephrosis. Tiny right kidney upper pole cyst. Stomach/Bowel: No evidence of obstruction, inflammatory process, or abnormal fluid collections. Mild gastric distention. Vascular/Lymphatic: No pathologically enlarged lymph nodes. No evidence of abdominal aortic aneurysm. Reproductive: Prostate enlargement with post TURP appearance. Enhancement of the urinary tract within the prostate. Other: Small right inguinal hernia containing fat. Musculoskeletal: Extensive degenerative changes of the lumbar spine with  multilevel disc space narrowing, minimal L2-3 retrolisthesis, and interbody device at L3-4 and postsurgical changes related L3-4 laminectomy and facetectomy. Subcentimeter sclerotic focus and right ilium near sacroiliac joint is probably a bone island. No suspicious osseous lesion. IMPRESSION: 1. Prostate hypertrophy with a post TURP appearance. Enhancement of the urinary tract within the prostate may represent infection or inflammation. 2. Small right inguinal hernia containing fat. 3. Extensive degenerative changes of the lumbar spine. 4. Partially visualized ill-defined opacity in the posterior right lower lobe may represent scarring, possibly pneumonia. Consider correlation with two view chest radiographs if clinically indicated. 5. Otherwise no acute process of abdomen and pelvis is identified. Electronically Signed   By: Kristine Garbe M.D.   On: 06/06/2016 05:00      Procedures     INITIAL IMPRESSION / ASSESSMENT AND PLAN / ED COURSE  Pertinent labs & imaging results that were available during my care of the patient were reviewed by me and considered in my medical decision making (see chart for details).  History of physical exam concern for possible prostatitis   Clinical Course  Value Comment By Time  DG Abdomen 1 View (Reviewed) Gregor Hams, MD 08/25 0141    ____________________________________________  FINAL CLINICAL IMPRESSION(S) / ED DIAGNOSES  Final diagnoses:  Acute prostatitis  MEDICATIONS GIVEN DURING THIS VISIT:  Medications  sodium phosphate (FLEET) 7-19 GM/118ML enema 1 enema (not administered)  ketorolac (TORADOL) 30 MG/ML injection 30 mg (30 mg Intravenous Given 06/06/16 0233)  magnesium citrate solution 1 Bottle (1 Bottle Oral Given 06/06/16 0415)  diatrizoate meglumine-sodium (GASTROGRAFIN) 66-10 % solution 15 mL (15 mLs Oral Given 06/06/16 0410)  morphine 4 MG/ML injection 4 mg (4 mg Intravenous Given 06/06/16 0422)  iopamidol  (ISOVUE-300) 61 % injection 100 mL (100 mLs Intravenous Contrast Given 06/06/16 0438)     NEW OUTPATIENT MEDICATIONS STARTED DURING THIS VISIT:  New Prescriptions   No medications on file      Note:  This document was prepared using Dragon voice recognition software and may include unintentional dictation errors.    Gregor Hams, MD 06/06/16 [phone]

## 2016-12-16 DIAGNOSIS — Z4789 Encounter for other orthopedic aftercare: Secondary | ICD-10-CM | POA: Diagnosis not present

## 2016-12-16 DIAGNOSIS — R262 Difficulty in walking, not elsewhere classified: Secondary | ICD-10-CM | POA: Diagnosis not present

## 2016-12-16 DIAGNOSIS — R531 Weakness: Secondary | ICD-10-CM | POA: Diagnosis not present

## 2016-12-18 DIAGNOSIS — R531 Weakness: Secondary | ICD-10-CM | POA: Diagnosis not present

## 2016-12-18 DIAGNOSIS — R262 Difficulty in walking, not elsewhere classified: Secondary | ICD-10-CM | POA: Diagnosis not present

## 2016-12-18 DIAGNOSIS — Z4789 Encounter for other orthopedic aftercare: Secondary | ICD-10-CM | POA: Diagnosis not present

## 2016-12-23 DIAGNOSIS — Z4789 Encounter for other orthopedic aftercare: Secondary | ICD-10-CM | POA: Diagnosis not present

## 2016-12-23 DIAGNOSIS — R531 Weakness: Secondary | ICD-10-CM | POA: Diagnosis not present

## 2016-12-23 DIAGNOSIS — R262 Difficulty in walking, not elsewhere classified: Secondary | ICD-10-CM | POA: Diagnosis not present

## 2016-12-25 DIAGNOSIS — R262 Difficulty in walking, not elsewhere classified: Secondary | ICD-10-CM | POA: Diagnosis not present

## 2016-12-25 DIAGNOSIS — Z4789 Encounter for other orthopedic aftercare: Secondary | ICD-10-CM | POA: Diagnosis not present

## 2016-12-25 DIAGNOSIS — R531 Weakness: Secondary | ICD-10-CM | POA: Diagnosis not present

## 2016-12-30 DIAGNOSIS — Z4789 Encounter for other orthopedic aftercare: Secondary | ICD-10-CM | POA: Diagnosis not present

## 2016-12-30 DIAGNOSIS — R262 Difficulty in walking, not elsewhere classified: Secondary | ICD-10-CM | POA: Diagnosis not present

## 2016-12-30 DIAGNOSIS — R531 Weakness: Secondary | ICD-10-CM | POA: Diagnosis not present

## 2016-12-31 DIAGNOSIS — R531 Weakness: Secondary | ICD-10-CM | POA: Diagnosis not present

## 2016-12-31 DIAGNOSIS — R262 Difficulty in walking, not elsewhere classified: Secondary | ICD-10-CM | POA: Diagnosis not present

## 2016-12-31 DIAGNOSIS — Z4789 Encounter for other orthopedic aftercare: Secondary | ICD-10-CM | POA: Diagnosis not present

## 2017-01-01 DIAGNOSIS — R531 Weakness: Secondary | ICD-10-CM | POA: Diagnosis not present

## 2017-01-01 DIAGNOSIS — R262 Difficulty in walking, not elsewhere classified: Secondary | ICD-10-CM | POA: Diagnosis not present

## 2017-01-01 DIAGNOSIS — Z4789 Encounter for other orthopedic aftercare: Secondary | ICD-10-CM | POA: Diagnosis not present

## 2017-01-03 DIAGNOSIS — R262 Difficulty in walking, not elsewhere classified: Secondary | ICD-10-CM | POA: Diagnosis not present

## 2017-01-03 DIAGNOSIS — R531 Weakness: Secondary | ICD-10-CM | POA: Diagnosis not present

## 2017-01-03 DIAGNOSIS — Z4789 Encounter for other orthopedic aftercare: Secondary | ICD-10-CM | POA: Diagnosis not present

## 2017-01-06 DIAGNOSIS — Z4789 Encounter for other orthopedic aftercare: Secondary | ICD-10-CM | POA: Diagnosis not present

## 2017-01-06 DIAGNOSIS — R262 Difficulty in walking, not elsewhere classified: Secondary | ICD-10-CM | POA: Diagnosis not present

## 2017-01-06 DIAGNOSIS — R531 Weakness: Secondary | ICD-10-CM | POA: Diagnosis not present

## 2017-01-07 DIAGNOSIS — R531 Weakness: Secondary | ICD-10-CM | POA: Diagnosis not present

## 2017-01-07 DIAGNOSIS — R262 Difficulty in walking, not elsewhere classified: Secondary | ICD-10-CM | POA: Diagnosis not present

## 2017-01-07 DIAGNOSIS — Z4789 Encounter for other orthopedic aftercare: Secondary | ICD-10-CM | POA: Diagnosis not present

## 2017-01-08 DIAGNOSIS — R531 Weakness: Secondary | ICD-10-CM | POA: Diagnosis not present

## 2017-01-08 DIAGNOSIS — R262 Difficulty in walking, not elsewhere classified: Secondary | ICD-10-CM | POA: Diagnosis not present

## 2017-01-08 DIAGNOSIS — Z4789 Encounter for other orthopedic aftercare: Secondary | ICD-10-CM | POA: Diagnosis not present

## 2019-08-02 ENCOUNTER — Other Ambulatory Visit: Payer: Self-pay

## 2019-08-02 ENCOUNTER — Emergency Department: Payer: Medicare Other

## 2019-08-02 DIAGNOSIS — R05 Cough: Secondary | ICD-10-CM | POA: Diagnosis not present

## 2019-08-02 DIAGNOSIS — R918 Other nonspecific abnormal finding of lung field: Secondary | ICD-10-CM | POA: Diagnosis not present

## 2019-08-02 DIAGNOSIS — J449 Chronic obstructive pulmonary disease, unspecified: Secondary | ICD-10-CM | POA: Insufficient documentation

## 2019-08-02 DIAGNOSIS — R7989 Other specified abnormal findings of blood chemistry: Secondary | ICD-10-CM | POA: Insufficient documentation

## 2019-08-02 DIAGNOSIS — Z8546 Personal history of malignant neoplasm of prostate: Secondary | ICD-10-CM | POA: Insufficient documentation

## 2019-08-02 DIAGNOSIS — I10 Essential (primary) hypertension: Secondary | ICD-10-CM | POA: Diagnosis not present

## 2019-08-02 DIAGNOSIS — R079 Chest pain, unspecified: Secondary | ICD-10-CM | POA: Diagnosis not present

## 2019-08-02 DIAGNOSIS — Z20828 Contact with and (suspected) exposure to other viral communicable diseases: Secondary | ICD-10-CM | POA: Insufficient documentation

## 2019-08-02 DIAGNOSIS — J40 Bronchitis, not specified as acute or chronic: Secondary | ICD-10-CM | POA: Diagnosis not present

## 2019-08-02 LAB — BASIC METABOLIC PANEL
Anion gap: 8 (ref 5–15)
BUN: 30 mg/dL — ABNORMAL HIGH (ref 8–23)
CO2: 24 mmol/L (ref 22–32)
Calcium: 9.1 mg/dL (ref 8.9–10.3)
Chloride: 110 mmol/L (ref 98–111)
Creatinine, Ser: 1.5 mg/dL — ABNORMAL HIGH (ref 0.61–1.24)
GFR calc Af Amer: 54 mL/min — ABNORMAL LOW (ref >60)
GFR calc non Af Amer: 46 mL/min — ABNORMAL LOW (ref >60)
Glucose, Bld: 115 mg/dL — ABNORMAL HIGH (ref 70–99)
Potassium: 3.7 mmol/L (ref 3.5–5.1)
Sodium: 142 mmol/L (ref 135–145)

## 2019-08-02 LAB — CBC
HCT: 40.1 % (ref 39.0–52.0)
Hemoglobin: 12.9 g/dL — ABNORMAL LOW (ref 13.0–17.0)
MCH: 27.3 pg (ref 26.0–34.0)
MCHC: 32.2 g/dL (ref 30.0–36.0)
MCV: 85 fL (ref 80.0–100.0)
Platelets: 207 10*3/uL (ref 150–400)
RBC: 4.72 MIL/uL (ref 4.22–5.81)
RDW: 14.5 % (ref 11.5–15.5)
WBC: 6 10*3/uL (ref 4.0–10.5)
nRBC: 0 % (ref 0.0–0.2)

## 2019-08-02 LAB — TROPONIN I (HIGH SENSITIVITY): Troponin I (High Sensitivity): 6 ng/L (ref <18)

## 2019-08-02 NOTE — ED Triage Notes (Signed)
Pt to ED POV with complaint of cough x2 weeks. Reports coughing up phlegm. Denies fevers/chills. Reports chest and back pain from coughing.  Pt in NAD at this time.

## 2019-08-03 ENCOUNTER — Emergency Department
Admission: EM | Admit: 2019-08-03 | Discharge: 2019-08-03 | Disposition: A | Discharge status: Home / Self Care | Payer: Medicare Other | Attending: Emergency Medicine | Admitting: Emergency Medicine

## 2019-08-03 DIAGNOSIS — J40 Bronchitis, not specified as acute or chronic: Secondary | ICD-10-CM

## 2019-08-03 DIAGNOSIS — R7989 Other specified abnormal findings of blood chemistry: Secondary | ICD-10-CM

## 2019-08-03 DIAGNOSIS — R9389 Abnormal findings on diagnostic imaging of other specified body structures: Secondary | ICD-10-CM

## 2019-08-03 MED ORDER — ALBUTEROL SULFATE HFA 108 (90 BASE) MCG/ACT IN AERS: 2.0000 | INHALATION_SPRAY | Freq: Four times a day (QID) | RESPIRATORY_TRACT | 1 refills | Status: DC | PRN

## 2019-08-03 MED ORDER — AZITHROMYCIN 250 MG PO TABS: ORAL_TABLET | ORAL | 0 refills | Status: DC

## 2019-08-03 MED ORDER — PREDNISONE 20 MG PO TABS: 60.0000 mg | ORAL_TABLET | Freq: Every day | ORAL | 0 refills | Status: AC

## 2019-08-03 MED ORDER — AZITHROMYCIN 500 MG PO TABS
500.0000 mg | ORAL_TABLET | Freq: Once | ORAL | Status: AC
  Administered 2019-08-03: 01:00:00 500 mg via ORAL
  Filled 2019-08-03: qty 1

## 2019-08-03 MED ORDER — PREDNISONE 20 MG PO TABS
60.0000 mg | ORAL_TABLET | Freq: Once | ORAL | Status: AC
  Administered 2019-08-03: 60 mg via ORAL
  Filled 2019-08-03: qty 3

## 2019-08-03 NOTE — ED Notes (Signed)
Notified by patient that prescriptions were not received by [facility].  [place] called and message left providing the information of the three prescriptions prescribed by Dr. Alfred Levins.

## 2019-08-03 NOTE — ED Notes (Signed)
Patient states coughing for two weeks and his back and chest is hurting. Per patient cough is productive mucus is clear thick. Chest pain is in middle of chest. Per patient hx COPD.

## 2019-08-03 NOTE — ED Provider Notes (Signed)
[facility] Emergency Department Provider Note  ____________________________________________  Time seen: Approximately 12:39 AM  I have reviewed the triage vital signs and the nursing notes.   HISTORY  Chief Complaint Cough   HPI Timothy Matthews is a 73 y.o. male with a history of prostate cancer status post radiation, COPD,  hypertension, former cocaine abuse who presents for evaluation of cough.  Patient reports 2 weeks of cough productive of clear phlegm.  Has had intermittent wheezing.  He continues to smoke.  He denies chest pain or shortness of breath, fever or chills, body aches or sore throat, vomiting or diarrhea.  No known exposures to Covid.  He does not have an inhaler at home.  Has not seen his doctor in a while.  Denies cocaine use and reports that he has been clean for several years.  No personal or family history of blood clots, recent travel immobilization, leg pain or swelling, hemoptysis, or exogenous hormones.  Past Medical History:  Diagnosis Date  . Cancer ([place])   . Hypertension     There are no active problems to display for this patient.   History reviewed. No pertinent surgical history.  Prior to Admission medications   Medication Sig Start Date End Date Taking? Authorizing Provider  albuterol (VENTOLIN HFA) 108 (90 Base) MCG/ACT inhaler Inhale 2 puffs into the lungs every 6 (six) hours as needed for wheezing or shortness of breath. 08/03/19   Rudene Re, MD  azithromycin [facility]) 250 MG tablet Take 1 a day for 4 days 08/03/19   Alfred Levins, [place], MD  oxyCODONE-acetaminophen (ROXICET) 5-325 MG tablet Take 1 tablet by mouth every 4 (four) hours as needed for severe pain. 06/06/16   Gregor Hams, MD  predniSONE (DELTASONE) 20 MG tablet Take 3 tablets (60 mg total) by mouth daily for 4 days. 08/03/19 08/07/19  Rudene Re, MD    Allergies Patient has no known allergies.  No family history on file.   Social History Social History   Tobacco Use  . Smoking status: Not on file  Substance Use Topics  . Alcohol use: Not Currently    Frequency: Never  . Drug use: Not Currently    Review of Systems  Constitutional: Negative for fever. Eyes: Negative for visual changes. ENT: Negative for sore throat. Neck: No neck pain  Cardiovascular: Negative for chest pain. Respiratory: Negative for shortness of breath. + cough Gastrointestinal: Negative for abdominal pain, vomiting or diarrhea. Genitourinary: Negative for dysuria. Musculoskeletal: Negative for back pain. Skin: Negative for rash. Neurological: Negative for headaches, weakness or numbness. Psych: No SI or HI  ____________________________________________   PHYSICAL EXAM:  VITAL SIGNS: ED Triage Vitals  Enc Vitals Group     BP 08/02/19 1945 (!) 150/84     Pulse Rate 08/02/19 1944 63     Resp 08/02/19 1944 18     Temp 08/02/19 1944 98.4 F (36.9 C)     Temp Source 08/02/19 1944 Oral     SpO2 08/02/19 1944 98 %     Weight 08/02/19 1946 190 lb (86.2 kg)     Height 08/02/19 1946 5\' 10"  (1.778 m)     Head Circumference --      Peak Flow --      Pain Score 08/02/19 1945 8     Pain Loc --      Pain Edu? --      Excl. in [place]? --     Constitutional: Alert and oriented. Well appearing  and in no apparent distress. HEENT:      Head: Normocephalic and atraumatic.         Eyes: Conjunctivae are normal. Sclera is non-icteric.       Mouth/Throat: Mucous membranes are moist.       Neck: Supple with no signs of meningismus. Cardiovascular: Regular rate and rhythm. No murmurs, gallops, or rubs. 2+ symmetrical distal pulses are present in all extremities. No JVD. Respiratory: Normal respiratory effort. Lungs are clear to auscultation bilaterally. No wheezes, crackles, or rhonchi.  Gastrointestinal: Soft, non tender, and non distended with positive bowel sounds. No rebound or guarding. Musculoskeletal: Nontender with normal range  of motion in all extremities. No edema, cyanosis, or erythema of extremities. Neurologic: Normal speech and language. Face is symmetric. Moving all extremities. No gross focal neurologic deficits are appreciated. Skin: Skin is warm, dry and intact. No rash noted. Psychiatric: Mood and affect are normal. Speech and behavior are normal.  ____________________________________________   LABS (all labs ordered are listed, but only abnormal results are displayed)  Labs Reviewed  BASIC METABOLIC PANEL - Abnormal; Notable for the following components:      Result Value   Glucose, Bld 115 (*)    BUN 30 (*)    Creatinine, Ser 1.50 (*)    GFR calc non Af Amer 46 (*)    GFR calc Af Amer 54 (*)    All other components within normal limits  CBC - Abnormal; Notable for the following components:   Hemoglobin 12.9 (*)    All other components within normal limits  NOVEL CORONAVIRUS, NAA (HOSP ORDER, SEND-OUT TO REF LAB; TAT 18-24 HRS)  TROPONIN I (HIGH SENSITIVITY)   ____________________________________________  EKG  ED ECG REPORT I, Rudene Re, the attending physician, personally viewed and interpreted this ECG.  Sinus bradycardia, rate of 58, normal intervals, left axis deviation, diffuse T wave flattening with no ST elevations or depressions.  Flattening is new when compared to prior. ____________________________________________  RADIOLOGY  I have personally reviewed the images performed during this visit and I agree with the Radiologist's read.   Interpretation by Radiologist:  Dg Chest 2 View  Result Date: 08/02/2019 CLINICAL DATA:  Cough EXAM: CHEST - 2 VIEW COMPARISON:  May 24, 2013 FINDINGS: There are areas of apparent fibrosis in the upper lobes bilaterally. There is no frank edema or consolidation. Heart size and pulmonary vascularity are normal. No adenopathy. There is postoperative change in the visualized cervical region. IMPRESSION: Areas of apparent fibrosis in the  upper lobe regions. No frank edema or consolidation. Cardiac silhouette within normal limits. No demonstrable adenopathy. Electronically Signed   By: Lowella Grip III M.D.   On: 08/02/2019 20:12    ____________________________________________   PROCEDURES  Procedure(s) performed: None Procedures Critical Care performed:  None ____________________________________________   INITIAL IMPRESSION / ASSESSMENT AND PLAN / ED COURSE  73 y.o. male with a history of prostate cancer status post radiation, COPD,  hypertension, former cocaine abuse who presents for evaluation of cough x 2 weeks and intermittent wheezing.  Ddx bronchitis, COPD, fibrosis, PNA, viral illness. Low suspicion for PE with no CP, SOB, tachypnea, hypoxia, or tachycardia.  Patient has normal work of breathing, normal sats, lungs are clear to auscultation with no wheezing, no crackles, good air movement.  He looks euvolemic with no evidence of heart failure.  There is no pitting edema of his lower extremities.  EKG with no ischemia.  Chest x-ray with no pneumonia but radiologist concern of  possible areas of fibrosis in the upper lobes.  Labs with no leukocytosis.  Patient does have a creatinine of 1.50 which is new when compared to prior.  Baseline seems to be 1 however I am unable to see any records past 2017 since patient goes to the [place].  Will start patient on prednisone, z-pack, and albuterol inhaler for bronchitis. Recommended close f/u with PCP for further evaluation of kidney function and possible fibrosis. Discussed my standard precautions for CP, SOB, fever, COVID like symptoms, hemoptysis. Smoking cessation counseling was provided.        As part of my medical decision making, I reviewed the following data within the [place] notes reviewed and incorporated, Labs reviewed , EKG interpreted , Old chart reviewed, Radiograph reviewed , Notes from prior ED visits and [place] Controlled Substance  Database   Patient was evaluated in Emergency Department today for the symptoms described in the history of present illness. Patient was evaluated in the context of the global COVID-19 pandemic, which necessitated consideration that the patient might be at risk for infection with the SARS-CoV-2 virus that causes COVID-19. Institutional protocols and algorithms that pertain to the evaluation of patients at risk for COVID-19 are in a state of rapid change based on information released by regulatory bodies including the CDC and federal and state organizations. These policies and algorithms were followed during the patient's care in the ED.   ____________________________________________   FINAL CLINICAL IMPRESSION(S) / ED DIAGNOSES   Final diagnoses:  Elevated serum creatinine  Abnormal CXR  Bronchitis      NEW MEDICATIONS STARTED DURING THIS VISIT:  ED Discharge Orders         Ordered    albuterol (VENTOLIN HFA) 108 (90 Base) MCG/ACT inhaler  Every 6 hours PRN     08/03/19 0046    predniSONE (DELTASONE) 20 MG tablet  Daily     08/03/19 0046    azithromycin (ZITHROMAX) 250 MG tablet     08/03/19 0046           Note:  This document was prepared using Dragon voice recognition software and may include unintentional dictation errors.    Alfred Levins, [place], MD 08/03/19 [phone]

## 2019-08-03 NOTE — Discharge Instructions (Addendum)
You should schedule an appointment with your doctor at the [facility] to discuss the abnormal chest x-ray concerning for possible fibrosis and at your slightly elevated kidney function.  Make sure to call them first thing in the morning.  Take the Z-Pak and prednisone as prescribed.  Use your inhaler as needed for wheezing.  Return to the emergency room if you are coughing up blood, if you have a fever, chest pain or shortness of breath.

## 2019-08-04 LAB — NOVEL CORONAVIRUS, NAA (HOSP ORDER, SEND-OUT TO REF LAB; TAT 18-24 HRS): SARS-CoV-2, NAA: NOT DETECTED

## 2019-08-29 DIAGNOSIS — R6889 Other general symptoms and signs: Secondary | ICD-10-CM | POA: Diagnosis not present

## 2020-03-20 ENCOUNTER — Other Ambulatory Visit: Payer: Self-pay

## 2020-03-20 ENCOUNTER — Emergency Department: Payer: Medicare Other

## 2020-03-20 ENCOUNTER — Encounter: Payer: Self-pay | Admitting: Emergency Medicine

## 2020-03-20 ENCOUNTER — Emergency Department
Admission: EM | Admit: 2020-03-20 | Discharge: 2020-03-20 | Disposition: A | Discharge status: Home / Self Care | Payer: Medicare Other | Attending: Emergency Medicine | Admitting: Emergency Medicine

## 2020-03-20 DIAGNOSIS — R140 Abdominal distension (gaseous): Secondary | ICD-10-CM | POA: Diagnosis not present

## 2020-03-20 DIAGNOSIS — I10 Essential (primary) hypertension: Secondary | ICD-10-CM | POA: Insufficient documentation

## 2020-03-20 DIAGNOSIS — R1032 Left lower quadrant pain: Secondary | ICD-10-CM | POA: Diagnosis not present

## 2020-03-20 DIAGNOSIS — R1031 Right lower quadrant pain: Secondary | ICD-10-CM | POA: Diagnosis not present

## 2020-03-20 DIAGNOSIS — M545 Low back pain: Secondary | ICD-10-CM | POA: Diagnosis not present

## 2020-03-20 DIAGNOSIS — Z8546 Personal history of malignant neoplasm of prostate: Secondary | ICD-10-CM | POA: Diagnosis not present

## 2020-03-20 DIAGNOSIS — R109 Unspecified abdominal pain: Secondary | ICD-10-CM

## 2020-03-20 DIAGNOSIS — R1010 Upper abdominal pain, unspecified: Secondary | ICD-10-CM | POA: Diagnosis not present

## 2020-03-20 DIAGNOSIS — R001 Bradycardia, unspecified: Secondary | ICD-10-CM | POA: Diagnosis not present

## 2020-03-20 DIAGNOSIS — K5900 Constipation, unspecified: Secondary | ICD-10-CM | POA: Diagnosis not present

## 2020-03-20 LAB — URINALYSIS, COMPLETE (UACMP) WITH MICROSCOPIC
Bacteria, UA: NONE SEEN
Bilirubin Urine: NEGATIVE
Glucose, UA: NEGATIVE mg/dL
Ketones, ur: NEGATIVE mg/dL
Leukocytes,Ua: NEGATIVE
Nitrite: NEGATIVE
Protein, ur: NEGATIVE mg/dL
Specific Gravity, Urine: 1.017 (ref 1.005–1.030)
Squamous Epithelial / LPF: NONE SEEN (ref 0–5)
pH: 5 (ref 5.0–8.0)

## 2020-03-20 LAB — HEPATIC FUNCTION PANEL
ALT: 26 U/L (ref 0–44)
AST: 34 U/L (ref 15–41)
Albumin: 3.4 g/dL — ABNORMAL LOW (ref 3.5–5.0)
Alkaline Phosphatase: 47 U/L (ref 38–126)
Bilirubin, Direct: 0.1 mg/dL (ref 0.0–0.2)
Indirect Bilirubin: 0.5 mg/dL (ref 0.3–0.9)
Total Bilirubin: 0.6 mg/dL (ref 0.3–1.2)
Total Protein: 7.6 g/dL (ref 6.5–8.1)

## 2020-03-20 LAB — CBC
HCT: 44.5 % (ref 39.0–52.0)
Hemoglobin: 14.7 g/dL (ref 13.0–17.0)
MCH: 27.5 pg (ref 26.0–34.0)
MCHC: 33 g/dL (ref 30.0–36.0)
MCV: 83.2 fL (ref 80.0–100.0)
Platelets: 166 10*3/uL (ref 150–400)
RBC: 5.35 MIL/uL (ref 4.22–5.81)
RDW: 14.4 % (ref 11.5–15.5)
WBC: 7.6 10*3/uL (ref 4.0–10.5)
nRBC: 0 % (ref 0.0–0.2)

## 2020-03-20 LAB — BASIC METABOLIC PANEL
Anion gap: 6 (ref 5–15)
BUN: 21 mg/dL (ref 8–23)
CO2: 25 mmol/L (ref 22–32)
Calcium: 8.8 mg/dL — ABNORMAL LOW (ref 8.9–10.3)
Chloride: 108 mmol/L (ref 98–111)
Creatinine, Ser: 1.15 mg/dL (ref 0.61–1.24)
GFR calc Af Amer: >60 mL/min (ref >60)
GFR calc non Af Amer: >60 mL/min (ref >60)
Glucose, Bld: 94 mg/dL (ref 70–99)
Potassium: 3.9 mmol/L (ref 3.5–5.1)
Sodium: 139 mmol/L (ref 135–145)

## 2020-03-20 LAB — LIPASE, BLOOD: Lipase: 20 U/L (ref 11–51)

## 2020-03-20 MED ORDER — IOHEXOL 300 MG/ML  SOLN
100.0000 mL | Freq: Once | INTRAMUSCULAR | Status: AC | PRN
  Administered 2020-03-20: 100 mL via INTRAVENOUS
  Filled 2020-03-20: qty 100

## 2020-03-20 MED ORDER — LIDOCAINE 5 % EX PTCH
2.0000 | MEDICATED_PATCH | CUTANEOUS | Status: DC
  Administered 2020-03-20: 2 via TRANSDERMAL
  Filled 2020-03-20 (×2): qty 2

## 2020-03-20 MED ORDER — ACETAMINOPHEN 500 MG PO TABS
1000.0000 mg | ORAL_TABLET | Freq: Once | ORAL | Status: AC
  Administered 2020-03-20: 1000 mg via ORAL
  Filled 2020-03-20: qty 2

## 2020-03-20 MED ORDER — KETOROLAC TROMETHAMINE 30 MG/ML IJ SOLN
15.0000 mg | Freq: Once | INTRAMUSCULAR | Status: AC
  Administered 2020-03-20: 15 mg via INTRAVENOUS
  Filled 2020-03-20: qty 1

## 2020-03-20 NOTE — ED Notes (Signed)
See triage note  Presents with bilateral flank pain  States pain started about 1 week ago   Now pain is increased

## 2020-03-20 NOTE — Discharge Instructions (Addendum)
This is most likely musculoskeletal flank pain. You can take Tylenol 1 g every 8 hours, lidocaine patches and ibuprofen 400-600 every 6-8 hours with food for the next 1 week. If is not resolving your pain you can follow-up with your primary care doctor.  EKG was also slightly abnormal but we discussed this new state how this before. If develop new lightheadedness or loss of consciousness you return to ER or follow-up with your PCP  1. Mild bladder wall thickening about the dome may be related to  chronic bladder outlet obstruction secondary to enlarged prostate  gland or cystitis.  2. No evidence of bowel obstruction. Moderate volume in the proximal  colon with mild colonic tortuosity, can be seen with constipation.  3. Hepatic steatosis.  4. Small right greater than left fat containing inguinal hernias.  Tiny fat containing umbilical hernia.  5. T10 compression fracture involving superior and inferior cortex,  age indeterminate but new from 2017.

## 2020-03-20 NOTE — ED Provider Notes (Addendum)
[facility] Emergency Department Provider Note  ____________________________________________   First MD Initiated Contact with Patient 03/20/20 1345     (approximate)  I have reviewed the triage vital signs and the nursing notes.   HISTORY  Chief Complaint Flank Pain    HPI Timothy Matthews is a 74 y.o. male.  Prostate cancer now in remission, hypertension who comes in with bilateral flank pain.  Patient reports having bilateral lower flank pain x1 week.  States that it is worse with movement, pushing on it.  The pain is constant.  Denies any pain radiating down his legs.  The pain however does radiate into his abdomen.  Denies any midline back pain.  Denies any new dysuria.  Denies any shortness of breath, chest pain.  States that he has taken some Tylenol but none today and did not fully alleviate.  Denies any fevers, vomiting, any other concerns.            Past Medical History:  Diagnosis Date  . Cancer ([place])   . Hypertension     There are no problems to display for this patient.   History reviewed. No pertinent surgical history.  Prior to Admission medications   Medication Sig Start Date End Date Taking? Authorizing Provider  albuterol (VENTOLIN HFA) 108 (90 Base) MCG/ACT inhaler Inhale 2 puffs into the lungs every 6 (six) hours as needed for wheezing or shortness of breath. 08/03/19   Rudene Re, MD  azithromycin [facility]) 250 MG tablet Take 1 a day for 4 days 08/03/19   Alfred Levins, [place], MD  oxyCODONE-acetaminophen (ROXICET) 5-325 MG tablet Take 1 tablet by mouth every 4 (four) hours as needed for severe pain. 06/06/16   Gregor Hams, MD    Allergies Patient has no known allergies.  No family history on file.  Social History Social History   Tobacco Use  . Smoking status: Never Smoker  . Smokeless tobacco: Never Used  Substance Use Topics  . Alcohol use: Not Currently  . Drug use: Not Currently      Review  of Systems Constitutional: No fever/chills Eyes: No visual changes. ENT: No sore throat. Cardiovascular: Denies chest pain. Respiratory: Denies shortness of breath. Gastrointestinal: Positive abdominal pain.  No nausea, no vomiting.  No diarrhea.  No constipation. Genitourinary: Negative for dysuria. Musculoskeletal:   Bilateral flank tenderness, no midline tenderness Skin: Negative for rash. Neurological: Negative for headaches, focal weakness or numbness. All other ROS negative ____________________________________________   PHYSICAL EXAM:  VITAL SIGNS: ED Triage Vitals  Enc Vitals Group     BP 03/20/20 1206 (!) 173/100     Pulse Rate 03/20/20 1206 (!) 52     Resp 03/20/20 1206 16     Temp 03/20/20 1206 97.9 F (36.6 C)     Temp Source 03/20/20 1206 Oral     SpO2 03/20/20 1206 99 %     Weight 03/20/20 1207 180 lb (81.6 kg)     Height 03/20/20 1207 5\' 10"  (1.778 m)     Head Circumference --      Peak Flow --      Pain Score 03/20/20 1206 10     Pain Loc --      Pain Edu? --      Excl. in [place]? --     Constitutional: Alert and oriented. Well appearing and in no acute distress. Eyes: Conjunctivae are normal. EOMI. Head: Atraumatic. Nose: No congestion/rhinnorhea. Mouth/Throat: Mucous membranes are moist.   Neck: No  stridor. Trachea Midline. FROM Cardiovascular: Normal rate, regular rhythm. Grossly normal heart sounds.  Good peripheral circulation. Respiratory: Normal respiratory effort.  No retractions. Lungs CTAB. Gastrointestinal: Slightly tender in the upper abdomen.. No distention. No abdominal bruits.  Musculoskeletal: No lower extremity tenderness nor edema.  No joint effusions. Neurologic:  Normal speech and language. No gross focal neurologic deficits are appreciated.  Skin:  Skin is warm, dry and intact. No rash noted on back Psychiatric: Mood and affect are normal. Speech and behavior are normal. GU: Deferred  Back: Tenderness in the lower back flank regions.   No rash noted.  Old scar midline but denies any tenderness along the midline back. ____________________________________________   LABS (all labs ordered are listed, but only abnormal results are displayed)  Labs Reviewed  URINALYSIS, COMPLETE (UACMP) WITH MICROSCOPIC - Abnormal; Notable for the following components:      Result Value   Color, Urine YELLOW (*)    APPearance CLEAR (*)    Hgb urine dipstick SMALL (*)    All other components within normal limits  BASIC METABOLIC PANEL - Abnormal; Notable for the following components:   Calcium 8.8 (*)    All other components within normal limits  HEPATIC FUNCTION PANEL - Abnormal; Notable for the following components:   Albumin 3.4 (*)    All other components within normal limits  CBC  LIPASE, BLOOD   ____________________________________________   ED ECG REPORT I, Vanessa [place], the attending physician, personally viewed and interpreted this ECG.  EKG sinus bradycardia 51 with some coupling, incomplete right bundle branch block, left anterior fascicular block, no ST elevation, no T wave inversion ____________________________________________  RADIOLOGY   Official radiology report(s): CT ABDOMEN PELVIS W CONTRAST  Result Date: 03/20/2020 CLINICAL DATA:  Abdominal distension. Bilateral flank pain for 1 week. Urinary frequency. EXAM: CT ABDOMEN AND PELVIS WITH CONTRAST TECHNIQUE: Multidetector CT imaging of the abdomen and pelvis was performed using the standard protocol following bolus administration of intravenous contrast. CONTRAST:  182mL OMNIPAQUE IOHEXOL 300 MG/ML  SOLN COMPARISON:  CT 06/06/2016 FINDINGS: Lower chest: Mild interstitial coarsening that appears chronic. No acute airspace disease or pleural effusion. Heart is normal in size. Hepatobiliary: Diffusely decreased hepatic density consistent with steatosis. No focal lesion. Gallbladder physiologically distended, no calcified stone. No biliary dilatation. Pancreas: No ductal  dilatation or inflammation. Spleen: Tiny hypodensity in the inferior spleen is nonspecific, but typically benign. Normal in size. Adrenals/Urinary Tract: No adrenal nodule. No hydronephrosis or perinephric edema. Homogeneous renal enhancement with symmetric excretion on delayed phase imaging. Small subcentimeter hypodensities in both kidneys are too small to characterize but likely cysts. Urinary bladder is partially distended. Minor wall thickening about the dome. Stomach/Bowel: Decompressed stomach. Normal positioning of the duodenum and ligament of Treitz. No small bowel obstruction or abnormal distention. No small bowel inflammation. Terminal ileum appears normal. Appendix not definitively visualized. No evidence of appendicitis. Moderate colonic stool burden in the proximal colon, small volume of stool in the more distal colon. Mild colonic tortuosity. There is no colonic wall thickening or inflammatory change. No obvious colonic mass. Vascular/Lymphatic: Aortic atherosclerosis and tortuosity. No aortic aneurysm. Patent portal vein. No adenopathy. Reproductive: Prominent prostate gland spanning 5 cm transverse. Probable TURP defect centrally. Other: No ascites or free air. Right greater than left fat containing inguinal hernias. Tiny fat containing umbilical hernia. Musculoskeletal: Degenerative and postsurgical change in the lumbar spine. T10 compression fracture involving superior and inferior cortex, not seen on prior exam. Degenerative change of the  hips and pubic symphysis. IMPRESSION: 1. Mild bladder wall thickening about the dome may be related to chronic bladder outlet obstruction secondary to enlarged prostate gland or cystitis. 2. No evidence of bowel obstruction. Moderate volume in the proximal colon with mild colonic tortuosity, can be seen with constipation. 3. Hepatic steatosis. 4. Small right greater than left fat containing inguinal hernias. Tiny fat containing umbilical hernia. 5. T10  compression fracture involving superior and inferior cortex, age indeterminate but new from 2017. Aortic Atherosclerosis (ICD10-I70.0). Electronically Signed   By: Keith Rake M.D.   On: 03/20/2020 15:40    ____________________________________________   PROCEDURES  Procedure(s) performed (including Critical Care):  Procedures   ____________________________________________   INITIAL IMPRESSION / ASSESSMENT AND PLAN / ED COURSE  JULLIEN GRANQUIST was evaluated in Emergency Department on 03/20/2020 for the symptoms described in the history of present illness. He was evaluated in the context of the global COVID-19 pandemic, which necessitated consideration that the patient might be at risk for infection with the SARS-CoV-2 virus that causes COVID-19. Institutional protocols and algorithms that pertain to the evaluation of patients at risk for COVID-19 are in a state of rapid change based on information released by regulatory bodies including the CDC and federal and state organizations. These policies and algorithms were followed during the patient's care in the ED.    To note patient is hypertensive but not take his medications yet this morning.  Patient is a 74 year old who comes in with concerns for bilateral lower back pain.  Occasionally the pain does wrap around to his abdomen.  Denies any shortness of breath or chest pain.  Patient is slightly bradycardic so we will get EKG to make sure no evidence of block.  EKG shows some coupling but does not appear to be in a block.  I reviewed his prior EKGs he has had some occasional company laying in the past.  Patient is asymptomatic without any LOC and on heart rate palpation he is running in the 50s.  Discussed with patient negative follow-up outpatient with PCP for this. Patient reports that he has been told that he has had this abnormality previously.   Will get labs to evaluate for cholecystitis, pancreatitis.  Will get CT scan to make sure  no evidence of mass, kidney stone, other acute pathology.  Patient still able to ambulate.  No midline tenderness and prostate cancer is in remission so unlikely to be metastatic disease to the spine.  No symptoms of cord compression.  Patient can still ambulate normally.  Patient requesting for food multiple times. Discussed with patient that we need to wait to the CT is resulted to give him food.   No evidence of UTI, labs are reassuring.  CT scan shows some incidental findings. Concern for mild bladder wall thickening but patient had no evidence of UTI on UA. There is concerns for may be some constipation. He is got some inguinal hernias I discussed with patient. Also is a T10 compression fracture that looked indeterminate age and patient has no tenderness over this site so I suspect that this is more likely old in nature. I discussed these incidental findings with patient and provided him a copy of report. Given patient is lower, worse with movement I suspect that this is more likely musculoskeletal in nature. We discussed symptomatic treatment Tylenol, ibuprofen, lidocaine patches and following up with his PCP. Upon discharge patient is again requesting food.  Patient's been seen ambulating around the ER without any difficulties.  I discussed the provisional nature of ED diagnosis, the treatment so far, the ongoing plan of care, follow up appointments and return precautions with the patient and any family or support people present. They expressed understanding and agreed with the plan, discharged home.    ____________________________________________   FINAL CLINICAL IMPRESSION(S) / ED DIAGNOSES   Final diagnoses:  Flank pain      MEDICATIONS GIVEN DURING THIS VISIT:  Medications  lidocaine (LIDODERM) 5 % 2 patch (2 patches Transdermal Patch Applied 03/20/20 1427)  ketorolac (TORADOL) 30 MG/ML injection 15 mg (has no administration in time range)  acetaminophen (TYLENOL) tablet  1,000 mg (1,000 mg Oral Given 03/20/20 1424)  iohexol (OMNIPAQUE) 300 MG/ML solution 100 mL (100 mLs Intravenous Contrast Given 03/20/20 1508)     ED Discharge Orders    None       Note:  This document was prepared using Dragon voice recognition software and may include unintentional dictation errors.   Vanessa [place], MD 03/20/20 1618    Vanessa [place]ama, MD 03/20/20 [phone]

## 2020-03-20 NOTE — ED Triage Notes (Signed)
Patient presents to the ED with worsening bilateral flank pain x 1 week.  Patient reports urinary frequency but states that is normal for him due to prostate issues.  Patient denies dysuria.

## 2020-03-20 NOTE — ED Notes (Signed)
E-signature not working at this time. Pt verbalized understanding of D/C instructions, prescriptions and follow up care with no further questions at this time. Pt in NAD and ambulatory at time of D/C.  

## 2020-04-07 ENCOUNTER — Encounter: Payer: Self-pay | Admitting: Intensive Care

## 2020-04-07 ENCOUNTER — Emergency Department
Admission: EM | Admit: 2020-04-07 | Discharge: 2020-04-09 | Disposition: A | Discharge status: Home / Self Care | Payer: Medicare Other | Attending: Emergency Medicine | Admitting: Emergency Medicine

## 2020-04-07 ENCOUNTER — Other Ambulatory Visit: Payer: Self-pay

## 2020-04-07 DIAGNOSIS — Z20822 Contact with and (suspected) exposure to covid-19: Secondary | ICD-10-CM | POA: Diagnosis not present

## 2020-04-07 DIAGNOSIS — F329 Major depressive disorder, single episode, unspecified: Secondary | ICD-10-CM | POA: Diagnosis present

## 2020-04-07 DIAGNOSIS — I10 Essential (primary) hypertension: Secondary | ICD-10-CM | POA: Insufficient documentation

## 2020-04-07 DIAGNOSIS — F32A Depression, unspecified: Secondary | ICD-10-CM

## 2020-04-07 DIAGNOSIS — G8929 Other chronic pain: Secondary | ICD-10-CM | POA: Insufficient documentation

## 2020-04-07 DIAGNOSIS — F1494 Cocaine use, unspecified with cocaine-induced mood disorder: Secondary | ICD-10-CM | POA: Insufficient documentation

## 2020-04-07 DIAGNOSIS — F17210 Nicotine dependence, cigarettes, uncomplicated: Secondary | ICD-10-CM | POA: Insufficient documentation

## 2020-04-07 DIAGNOSIS — F1410 Cocaine abuse, uncomplicated: Secondary | ICD-10-CM

## 2020-04-07 LAB — COMPREHENSIVE METABOLIC PANEL
ALT: 22 U/L (ref 0–44)
AST: 32 U/L (ref 15–41)
Albumin: 3.7 g/dL (ref 3.5–5.0)
Alkaline Phosphatase: 79 U/L (ref 38–126)
Anion gap: 8 (ref 5–15)
BUN: 17 mg/dL (ref 8–23)
CO2: 26 mmol/L (ref 22–32)
Calcium: 9.1 mg/dL (ref 8.9–10.3)
Chloride: 105 mmol/L (ref 98–111)
Creatinine, Ser: 1.19 mg/dL (ref 0.61–1.24)
GFR calc Af Amer: >60 mL/min (ref >60)
GFR calc non Af Amer: >60 mL/min (ref >60)
Glucose, Bld: 89 mg/dL (ref 70–99)
Potassium: 4.2 mmol/L (ref 3.5–5.1)
Sodium: 139 mmol/L (ref 135–145)
Total Bilirubin: 0.7 mg/dL (ref 0.3–1.2)
Total Protein: 8 g/dL (ref 6.5–8.1)

## 2020-04-07 LAB — CBC
HCT: 45.7 % (ref 39.0–52.0)
Hemoglobin: 14.6 g/dL (ref 13.0–17.0)
MCH: 27.5 pg (ref 26.0–34.0)
MCHC: 31.9 g/dL (ref 30.0–36.0)
MCV: 86.2 fL (ref 80.0–100.0)
Platelets: 194 10*3/uL (ref 150–400)
RBC: 5.3 MIL/uL (ref 4.22–5.81)
RDW: 14.7 % (ref 11.5–15.5)
WBC: 6.6 10*3/uL (ref 4.0–10.5)
nRBC: 0 % (ref 0.0–0.2)

## 2020-04-07 LAB — URINE DRUG SCREEN, QUALITATIVE (ARMC ONLY)
Amphetamines, Ur Screen: NOT DETECTED
Barbiturates, Ur Screen: NOT DETECTED
Benzodiazepine, Ur Scrn: NOT DETECTED
Cannabinoid 50 Ng, Ur ~~LOC~~: POSITIVE — AB
Cocaine Metabolite,Ur ~~LOC~~: POSITIVE — AB
MDMA (Ecstasy)Ur Screen: NOT DETECTED
Methadone Scn, Ur: NOT DETECTED
Opiate, Ur Screen: NOT DETECTED
Phencyclidine (PCP) Ur S: NOT DETECTED
Tricyclic, Ur Screen: NOT DETECTED

## 2020-04-07 LAB — ETHANOL: Alcohol, Ethyl (B): <10 mg/dL (ref <10)

## 2020-04-07 NOTE — ED Notes (Signed)
Pt speaking with TTS 

## 2020-04-07 NOTE — ED Provider Notes (Signed)
[facility] Emergency Department Provider Note   ____________________________________________   First MD Initiated Contact with Patient 04/07/20 1548     (approximate)  I have reviewed the triage vital signs and the nursing notes.   HISTORY  Chief Complaint Opioid Problem    HPI Timothy Matthews is a 74 y.o. male with past medical history of hypertension and prostate cancer who presents to the ED complaining of drug abuse.  Patient reports that he has been using cocaine consistently for a long time, mostly snorts or smokes it but will occasionally inject into his arms.  He last injected last night and snorted cocaine earlier this morning.  He admits to marijuana use but otherwise denies any other drug or alcohol abuse.  He states he has been depressed due to his drug use and thoughts of suicide "have crossed my mind".  He denies any specific plan to harm himself and denies any thoughts of harming others.  He deals with chronic back pain that is unchanged today, denies any numbness or weakness in his extremities and has not had any problems going to the bathroom.        Past Medical History:  Diagnosis Date  . Cancer ([place])   . Hypertension     There are no problems to display for this patient.   History reviewed. No pertinent surgical history.  Prior to Admission medications   Medication Sig Start Date End Date Taking? Authorizing Provider  albuterol (VENTOLIN HFA) 108 (90 Base) MCG/ACT inhaler Inhale 2 puffs into the lungs every 6 (six) hours as needed for wheezing or shortness of breath. 08/03/19   Rudene Re, MD  azithromycin [facility]) 250 MG tablet Take 1 a day for 4 days 08/03/19   Alfred Levins, [place], MD  oxyCODONE-acetaminophen (ROXICET) 5-325 MG tablet Take 1 tablet by mouth every 4 (four) hours as needed for severe pain. 06/06/16   Gregor Hams, MD    Allergies Patient has no known allergies.  History reviewed. No pertinent  family history.  Social History Social History   Tobacco Use  . Smoking status: Current Every Day Smoker    Types: Cigarettes  . Smokeless tobacco: Never Used  Substance Use Topics  . Alcohol use: Not Currently  . Drug use: Yes    Types: Cocaine, Marijuana    Review of Systems  Constitutional: No fever/chills Eyes: No visual changes. ENT: No sore throat. Cardiovascular: Denies chest pain. Respiratory: Denies shortness of breath. Gastrointestinal: No abdominal pain.  No nausea, no vomiting.  No diarrhea.  No constipation. Genitourinary: Negative for dysuria. Musculoskeletal: Negative for back pain. Skin: Negative for rash. Neurological: Negative for headaches, focal weakness or numbness.  Positive for depression.  ____________________________________________   PHYSICAL EXAM:  VITAL SIGNS: ED Triage Vitals  Enc Vitals Group     BP 04/07/20 1506 (!) 183/90     Pulse Rate 04/07/20 1506 60     Resp 04/07/20 1506 18     Temp 04/07/20 1506 97.6 F (36.4 C)     Temp Source 04/07/20 1506 Oral     SpO2 04/07/20 1506 98 %     Weight 04/07/20 1514 180 lb (81.6 kg)     Height 04/07/20 1514 5\' 10"  (1.778 m)     Head Circumference --      Peak Flow --      Pain Score 04/07/20 1514 8     Pain Loc --      Pain Edu? --  Excl. in [place]? --     Constitutional: Alert and oriented. Eyes: Conjunctivae are normal. Head: Atraumatic. Nose: No congestion/rhinnorhea. Mouth/Throat: Mucous membranes are moist. Neck: Normal ROM Cardiovascular: Normal rate, regular rhythm. Grossly normal heart sounds. Respiratory: Normal respiratory effort.  No retractions. Lungs CTAB. Gastrointestinal: Soft and nontender. No distention. Genitourinary: deferred Musculoskeletal: No lower extremity tenderness nor edema. Neurologic:  Normal speech and language. No gross focal neurologic deficits are appreciated. Skin:  Skin is warm, dry and intact. No rash noted. Psychiatric: Mood and affect are  normal. Speech and behavior are normal.  ____________________________________________   LABS (all labs ordered are listed, but only abnormal results are displayed)  Labs Reviewed  URINE DRUG SCREEN, QUALITATIVE ([place]) - Abnormal; Notable for the following components:      Result Value   Cocaine Metabolite,Ur [place] POSITIVE (*)    Cannabinoid 52 Ng, Ur [place] POSITIVE (*)    All other components within normal limits  COMPREHENSIVE METABOLIC PANEL  ETHANOL  CBC    PROCEDURES  Procedure(s) performed (including Critical Care):  Procedures   ____________________________________________   INITIAL IMPRESSION / ASSESSMENT AND PLAN / ED COURSE       74 year old male with past medical history of hypertension, prostate cancer, and chronic back pain presents to the ED requesting assistance with detox due to longstanding cocaine abuse.  He also reports depression as well as fleeting thoughts of suicide, but denies any specific plan.  While he admits to IV drug use, he does not have any fevers or infectious symptoms at this time.  Screening labs are unremarkable and he is medically cleared for evaluation by psychiatry and TTS.  He is calm and cooperative at this time and we will hold off on IVC.  The patient has been placed in psychiatric observation due to the need to provide a safe environment for the patient while obtaining psychiatric consultation and evaluation, as well as ongoing medical and medication management to treat the patient's condition.  The patient has not been placed under full IVC at this time.       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   Blake Divine,  MD 04/07/20 [phone]

## 2020-04-07 NOTE — BH Assessment (Signed)
Assessment Note  Timothy Matthews is an 74 y.o. male.Pt presents to the ED, voluntarily requesting detox from cocaine. Pt reports exacerbated symptoms of depression such as feelings of worthlessness, guilt, anhedonia, decreased appetite/sleep, and isolating behaviors. Pt identified his current stressors as his estranged relationship with his sister and other family members. The pt. also reported that his sister was recently diagnosed with cancer, which significantly contributes to his depression/anxiety. Pt. became tearful when explaining his family dynamics, and admitted that these stressors contribute to his drug use. Pt endorsed SI without a plan, but denied HI or AV/H. When probed about his SI, the pt. stated, "That's the reason I come in." Pt reported that he is isolated and has no familial or community supports at this time.  Pt presented with logical thought processes and coherent speech, however he had poor eye contact. The pt. presented with a depressed and sullen affect. The pt. was disheveled with poor grooming.    Diagnosis: MDD  Past Medical History:  Past Medical History:  Diagnosis Date  . Cancer ([place])   . Hypertension     History reviewed. No pertinent surgical history.  Family History: History reviewed. No pertinent family history.  Social History:  reports that he has been smoking cigarettes. He has never used smokeless tobacco. He reports previous alcohol use. He reports current drug use. Drugs: Cocaine and Marijuana.  Additional Social History:  Alcohol / Drug Use Pain Medications: See PTA. Prescriptions: See PTA. History of alcohol / drug use?: Yes Longest period of sobriety (when/how long): Unkown Negative Consequences of Use: Personal relationships Substance #1 Name of Substance 1: Cocaine 1 - Age of First Use: Unknown 1 - Amount (size/oz): Unknown 1 - Frequency: Daily 1 - Last Use / Amount: 04/07/20 Amount Unknown Substance #2 Name of Substance 2: Marijuana 2  - Age of First Use: Unknown 2 - Amount (size/oz): Unknown 2 - Frequency: Daily/Weekly at times 2 - Duration: Unknown 2 - Last Use / Amount: 04/07/20  CIWA: CIWA-Ar BP: (!) 183/90 Pulse Rate: 60 COWS:    Allergies: No Known Allergies  Home Medications: (Not in a hospital admission)   OB/GYN Status:  No LMP for male patient.  General Assessment Data Location of Assessment: [facility] ED TTS Assessment: In system Is this a Tele or Face-to-Face Assessment?: Face-to-Face Is this an Initial Assessment or a Re-assessment for this encounter?: Initial Assessment Patient Accompanied by:: N/A Language Other than English: No Living Arrangements: Other (Comment) What gender do you identify as?: Male Date Telepsych consult ordered in [facility]: 04/07/20 Time Telepsych consult ordered in [facility]: 1701 Marital status: Single Pregnancy Status: No Living Arrangements: Alone Can pt return to current living arrangement?: Yes Admission Status: Voluntary Is patient capable of signing voluntary admission?: Yes Referral Source: Self/Family/Friend  Medical Screening Exam [facility] Walk-in ONLY) Medical Exam completed: Yes  Crisis Care Plan Living Arrangements: Alone Legal Guardian:  (Self) Name of Psychiatrist: None Noted Name of Therapist: None noted  Education Status Is patient currently in school?: No Is the patient employed, unemployed or receiving disability?: Unemployed  Risk to self with the past 6 months Suicidal Ideation: Yes-Currently Present Has patient been a risk to self within the past 6 months prior to admission? : No Suicidal Intent: No Has patient had any suicidal intent within the past 6 months prior to admission? : No Is patient at risk for suicide?: Yes Suicidal Plan?: No Has patient had any suicidal plan within the past 6 months prior to admission? :  No Access to Means: No What has been your use of drugs/alcohol within the last 12 months?: Cocaine; Marijuana Previous  Attempts/Gestures: No Other Self Harm Risks: Pt. has family discord; no supports; cancer diagnosis; substance abuser Triggers for Past Attempts: None known Intentional Self Injurious Behavior: None Family Suicide History: Unknown Recent stressful life event(s): Conflict (Comment) Persecutory voices/beliefs?: No Depression: Yes Depression Symptoms: Despondent, Tearfulness, Isolating, Feeling worthless/self pity Substance abuse history and/or treatment for substance abuse?: Yes Suicide prevention information given to non-admitted patients: Not applicable  Risk to Others within the past 6 months Homicidal Ideation: No Does patient have any lifetime risk of violence toward others beyond the six months prior to admission? : No Thoughts of Harm to Others: No Current Homicidal Intent: No Current Homicidal Plan: No Access to Homicidal Means: No History of harm to others?: No Assessment of Violence: None Noted Violent Behavior Description: none noted Does patient have access to weapons?: No Criminal Charges Pending?: No Does patient have a court date: No Is patient on probation?: No  Psychosis Hallucinations: None noted Delusions: None noted  Mental Status Report Appearance/Hygiene: Disheveled Eye Contact: Poor Motor Activity: Freedom of movement Speech: Logical/coherent Level of Consciousness: Alert Mood: Depressed, Anhedonia, Ashamed/humiliated, Despair, Guilty, Helpless, Sad, Worthless, low self-esteem Affect: Depressed, Sad Anxiety Level: Minimal Thought Processes: Coherent, Relevant Judgement: Impaired Orientation: Person, Place, Time, Situation Obsessive Compulsive Thoughts/Behaviors: None  Cognitive Functioning Concentration: Normal Memory: Recent Intact, Remote Intact Is patient IDD: No Insight: Good Impulse Control: Poor Appetite: Fair Have you had any weight changes? : No Change Sleep: Decreased Vegetative Symptoms: None  ADLScreening [facility] Assessment  Services) Patient's cognitive ability adequate to safely complete daily activities?: Yes Patient able to express need for assistance with ADLs?: Yes Independently performs ADLs?: Yes (appropriate for developmental age)  Prior Inpatient Therapy Prior Inpatient Therapy: Yes Prior Therapy Dates:  (Unknown) Prior Therapy Facilty/Provider(s):  ([facility]) Reason for Treatment:  (psyhiatric)  Prior Outpatient Therapy Prior Outpatient Therapy: No Does patient have an ACCT team?: No Does patient have Intensive In-House Services?  : No Does patient have [facility] services? : No Does patient have P4CC services?: No  ADL Screening (condition at time of admission) Patient's cognitive ability adequate to safely complete daily activities?: Yes Is the patient deaf or have difficulty hearing?: No Does the patient have difficulty seeing, even when wearing glasses/contacts?: No Does the patient have difficulty concentrating, remembering, or making decisions?: No Patient able to express need for assistance with ADLs?: Yes Does the patient have difficulty dressing or bathing?: No Independently performs ADLs?: Yes (appropriate for developmental age) Does the patient have difficulty walking or climbing stairs?: No Weakness of Legs: None Weakness of Arms/Hands: None  Home Assistive Devices/Equipment Home Assistive Devices/Equipment: None  Therapy Consults (therapy consults require a physician order) PT Evaluation Needed: No OT Evalulation Needed: No SLP Evaluation Needed: No   Values / Beliefs Cultural Requests During Hospitalization: None Spiritual Requests During Hospitalization: None Consults Spiritual Care Consult Needed: No Transition of Care Team Consult Needed: No Advance Directives (For Healthcare) Does Patient Have a Medical Advance Directive?: No Would patient like information on creating a medical advance directive?: No - Patient declined          Disposition: Pending Psych  Consult. Disposition Initial Assessment Completed for this Encounter: Yes  On Site Evaluation by:   Reviewed with Physician:    Kathi Ludwig 04/07/2020 6:07 PM

## 2020-04-07 NOTE — ED Triage Notes (Signed)
Patient reports he is here for detox due to cocaine. Reports chronic back pain. HX prostate cancer

## 2020-04-07 NOTE — ED Notes (Signed)
Pocket knife given to security to be locked up. Other belongings include: chapstick, lighter and pen light  Pt is not dressed out at this time. Pt is voluntary.

## 2020-04-07 NOTE — ED Notes (Signed)
Pt given drink, crackers and peanut butter.

## 2020-04-07 NOTE — ED Notes (Signed)
Pt given sandwich tray. Pt stated he has not eaten recently.

## 2020-04-07 NOTE — BH Assessment (Addendum)
Referral information for Substance Abuse Treatment faxed to;   . [facility] [phone] or [phone]) Currently no beds available  . [facility] [phone]) No answer

## 2020-04-07 NOTE — ED Notes (Signed)
Pt. Was dressed out by this tech and provided with hospital attire ( [place] scrubs both top and bottom0  Pt. Belonging were placed inside Provided hospital bag.   Pt belongings are:  Air traffic controller and black belt  Wallet and cell phone  Some coin change $ Lighter Navy pair of shoes / white socks  White underwear  gray t-shirt

## 2020-04-08 DIAGNOSIS — F329 Major depressive disorder, single episode, unspecified: Secondary | ICD-10-CM | POA: Diagnosis present

## 2020-04-08 LAB — SARS CORONAVIRUS 2 BY RT PCR (HOSPITAL ORDER, PERFORMED IN ~~LOC~~ HOSPITAL LAB): SARS Coronavirus 2: NEGATIVE

## 2020-04-08 MED ORDER — ACETAMINOPHEN 500 MG PO TABS
1000.0000 mg | ORAL_TABLET | Freq: Once | ORAL | Status: AC
  Administered 2020-04-08: 1000 mg via ORAL
  Filled 2020-04-08: qty 2

## 2020-04-08 NOTE — ED Notes (Signed)
Hourly rounding reveals patient sleeping in room. No complaints, stable, in no acute distress. Q15 minute rounds and monitoring via Security Cameras to continue. 

## 2020-04-08 NOTE — ED Notes (Signed)
Pt given dinner tray.

## 2020-04-08 NOTE — ED Notes (Signed)
Patient has been accepted to [facility]. After 9:00am on 28Jun2021  Accepting Physician is- Jonelle Sports, MD   [facility] staff aware:  ED RN: Suzette Battiest NP: Paris Lore

## 2020-04-08 NOTE — ED Notes (Signed)
Pt has taken a shower.

## 2020-04-08 NOTE — ED Notes (Signed)
Pt given medication for back pain (7/10).

## 2020-04-08 NOTE — Consult Note (Signed)
The patient was seen face-to-face by this provider; chart reviewed and consulted with Dr. Kerman Passey on 04/08/2020  due to the patient's care. It was discussed with the EDP that the patient presentation has improved and he continues to deny suicidal ideations. He has improved thought processes however continues to show motivation and seek assistance with residential detox programs. He denies any current depressive symptoms, manic symptoms, and or psychosis. He is solely here for the purposes of detox and residential services. He was psych cleared overnight, and reassessed this morning to see if he felt stable to be discharged back into the community and await placement for detox facility. Patient declined and states " I just got here I am not ready to go back out there yet. I am uncertain about myself. I dont feel like my mind is stable enough to leave. " Patient remains vague in his statements. He denies suicidal ideations but unable to contract for safety if he discharges " I am uncertain and my mind is not stable. "    The patient is alert and oriented x3, irritable and angry, challenging to arouse but cooperative, and mood-congruent, affecting evaluation. The patient does not appear to be responding to internal and external stimuli.  The patient denies auditory hallucinations and visual hallucinations. The patient denies suicidal, homicidal, or self-harm ideations. During an encounter with the patient, he was able to answer questions appropriately. Will psych clear at this time and continue to seek placement at detox and rehabilitation. Patient has denied suicidal ideations, intentions, gestures, and attempts. Denies any previous suicide attempt,a nd is seeking help for detox and residential.

## 2020-04-08 NOTE — ED Notes (Signed)
Meal tray provided.

## 2020-04-08 NOTE — BH Assessment (Signed)
Referral Check   [facility] [phone] or [phone]) Denied receiving fax; re-faxed today [phone], secondary fax #)    [facility] [place] [phone]) No answer

## 2020-04-08 NOTE — ED Notes (Signed)
Pt given meal tray.

## 2020-04-08 NOTE — BH Assessment (Signed)
Referral information for detox treatment faxed to:             [facility] [phone] or [phone])             [facility] [phone])             [facility] [phone])  .           [place] [phone])             [facility] [phone])  .           [facility] [phone])  .           [facility] [phone])  .           [facility] [phone])

## 2020-04-08 NOTE — BH Assessment (Addendum)
TTS reassessment completed. Pt presented calm, irritable but cooperative and oriented x 3. Pt reported to feel pretty good and stated "I am not ready go home yet I just got here". Pt stated "I don't feel like my mind is stable enough to leave" but was unable to explain. Pt denied any current SI/HI/AH/VH and expressed interest in detox. Pt was unable to contract for safety stating "I am unsure of myself".   Per Sheran Fava, NP pt is psych cleared pending INPT referral for detox

## 2020-04-08 NOTE — Consult Note (Signed)
[facility] Face-to-Face Psychiatry Consult   Reason for Consult: Opioid Problem Referring Physician: Dr. Charna Archer Patient Identification: Timothy Matthews MRN:  [id number] Principal Diagnosis: <principal problem not specified> Diagnosis:  Active Problems:   MDD (major depressive disorder)   Total Time spent with patient: 30 minutes  Subjective: "I do not have any mental health problems." Timothy Matthews is a 74 y.o. male patient presented to [facility] ED via [place] voluntarily.  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  The patient is positive for cocaine and cannabinoid.  The patient expressed he does not have a psychiatric history, has never been hospitalized for any mental illness, and does not take any psychiatric medication. The patient was seen face-to-face by this provider; the chart was reviewed and consulted with Dr. Charna Archer on 04/07/2020 due to the patient's care. It was discussed with the EDP that the patient does not meet the criteria to be admitted to the psychiatric inpatient unit.  The patient reports he has chronic back pain on evaluation, but he is alert and oriented x4, anxious but cooperative, and mood-congruent with affect.  The patient does not appear to be responding to internal or external stimuli. Neither is the patient presenting with any delusional thinking. The patient denies auditory or visual hallucinations. The patient denies any suicidal, homicidal, or self-harm ideations. The patient is not presenting with any psychotic or paranoid behaviors. During an encounter with the patient, he was able to answer questions appropriately. The patient has been referred out to another facility for substance abuse treatment per TTS counselor Ms. Handy.  HPI: Per Dr. Charna Archer: Timothy Matthews is a 74 y.o. male with past medical history of hypertension and prostate cancer who presents to the ED complaining of drug abuse.  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  He last injected last night and snorted cocaine earlier this morning.  He admits to marijuana use but otherwise denies any other drug or alcohol abuse.  He states he has been depressed due to his drug use and thoughts of suicide "have crossed my mind".  He denies any specific plan to harm himself and denies any thoughts of harming others.  He deals with chronic back pain that is unchanged today, denies any numbness or weakness in his extremities and has not had any problems going to the bathroom.    Past Psychiatric History: No pertinent past psychiatric history  Risk to Self: Suicidal Ideation: Yes-Currently Present Suicidal Intent: No Is patient at risk for suicide?: Yes Suicidal Plan?: No Access to Means: No What has been your use of drugs/alcohol within the last 12 months?: Cocaine; Marijuana Other Self Harm Risks: Pt. has family discord; no supports; cancer diagnosis; substance abuser Triggers for Past Attempts: None known Intentional Self Injurious Behavior: None Risk to Others: Homicidal Ideation: No Thoughts of Harm to Others: No Current Homicidal Intent: No Current Homicidal Plan: No Access to Homicidal Means: No History of harm to others?: No Assessment of Violence: None Noted Violent Behavior Description: none noted Does patient have access to weapons?: No Criminal Charges Pending?: No Does patient have a court date: No Prior Inpatient Therapy: Prior Inpatient Therapy: Yes Prior Therapy Dates:  (Unknown) Prior Therapy Facilty/Provider(s):  ([facility]) Reason for Treatment:  (psyhiatric) Prior Outpatient Therapy: Prior Outpatient Therapy: No Does patient have an ACCT team?:  No Does patient have Intensive In-House Services?  : No Does patient have [facility] services? : No Does patient have P4CC services?: No  Past Medical History:  Past Medical History:   Diagnosis Date  . Cancer ([place])   . Hypertension    History reviewed. No pertinent surgical history. Family History: History reviewed. No pertinent family history. Family Psychiatric  History:  Social History:  Social History   Substance and Sexual Activity  Alcohol Use Not Currently     Social History   Substance and Sexual Activity  Drug Use Yes  . Types: Cocaine, Marijuana    Social History   Socioeconomic History  . Marital status: Legally Separated    Spouse name: Not on file  . Number of children: Not on file  . Years of education: Not on file  . Highest education level: Not on file  Occupational History  . Not on file  Tobacco Use  . Smoking status: Current Every Day Smoker    Types: Cigarettes  . Smokeless tobacco: Never Used  Substance and Sexual Activity  . Alcohol use: Not Currently  . Drug use: Yes    Types: Cocaine, Marijuana  . Sexual activity: Not on file  Other Topics Concern  . Not on file  Social History Narrative  . Not on file   Social Determinants of Health   Financial Resource Strain:   . Difficulty of Paying Living Expenses:   Food Insecurity:   . Worried About Charity fundraiser in the Last Year:   . Arboriculturist in the Last Year:   Transportation Needs:   . Film/video editor (Medical):   Marland Kitchen Lack of Transportation (Non-Medical):   Physical Activity:   . Days of Exercise per Week:   . Minutes of Exercise per Session:   Stress:   . Feeling of Stress :   Social Connections:   . Frequency of Communication with Friends and Family:   . Frequency of Social Gatherings with Friends and Family:   . Attends Religious Services:   . Active Member of Clubs or Organizations:   . Attends Archivist Meetings:   Marland Kitchen Marital Status:    Additional Social History:    Allergies:  No Known Allergies  Labs:  Results for orders placed or performed during the hospital encounter of 04/07/20 (from the past 48 hour(s))  Comprehensive  metabolic panel     Status: None   Collection Time: 04/07/20  3:17 PM  Result Value Ref Range   Sodium 139 135 - 145 mmol/L   Potassium 4.2 3.5 - 5.1 mmol/L   Chloride 105 98 - 111 mmol/L   CO2 26 22 - 32 mmol/L   Glucose, Bld 89 70 - 99 mg/dL    Comment: Glucose reference range applies only to samples taken after fasting for at least 8 hours.   BUN 17 8 - 23 mg/dL   Creatinine, Ser 1.19 0.61 - 1.24 mg/dL   Calcium 9.1 8.9 - 10.3 mg/dL   Total Protein 8.0 6.5 - 8.1 g/dL   Albumin 3.7 3.5 - 5.0 g/dL   AST 32 15 - 41 U/L   ALT 22 0 - 44 U/L   Alkaline Phosphatase 79 38 - 126 U/L   Total Bilirubin 0.7 0.3 - 1.2 mg/dL   GFR calc non Af Amer >60 >60 mL/min   GFR calc Af Amer >60 >60 mL/min   Anion gap 8 5 - 15    Comment: Performed  at [facility], [address] [place], [place] [address]  Ethanol     Status: None   Collection Time: 04/07/20  3:17 PM  Result Value Ref Range   Alcohol, Ethyl (B) <10 <10 mg/dL    Comment: (NOTE) Lowest detectable limit for serum alcohol is 10 mg/dL.  For medical purposes only. Performed at [facility], [address] [place], [place] [address]   cbc     Status: None   Collection Time: 04/07/20  3:17 PM  Result Value Ref Range   WBC 6.6 4.0 - 10.5 K/uL   RBC 5.30 4.22 - 5.81 MIL/uL   Hemoglobin 14.6 13.0 - 17.0 g/dL   HCT 45.7 39 - 52 %   MCV 86.2 80.0 - 100.0 fL   MCH 27.5 26.0 - 34.0 pg   MCHC 31.9 30.0 - 36.0 g/dL   RDW 14.7 11.5 - 15.5 %   Platelets 194 150 - 400 K/uL   nRBC 0.0 0.0 - 0.2 %    Comment: Performed at [facility], [address] [place], [place] [address]  Urine Drug Screen, Qualitative     Status: Abnormal   Collection Time: 04/07/20  3:17 PM  Result Value Ref Range   Tricyclic, Ur Screen NONE DETECTED NONE DETECTED   Amphetamines, Ur Screen NONE DETECTED NONE DETECTED   MDMA (Ecstasy)Ur Screen NONE DETECTED NONE DETECTED   Cocaine Metabolite,Ur [place] POSITIVE (A) NONE DETECTED    Opiate, Ur Screen NONE DETECTED NONE DETECTED   Phencyclidine (PCP) Ur S NONE DETECTED NONE DETECTED   Cannabinoid 50 Ng, Ur  POSITIVE (A) NONE DETECTED   Barbiturates, Ur Screen NONE DETECTED NONE DETECTED   Benzodiazepine, Ur Scrn NONE DETECTED NONE DETECTED   Methadone Scn, Ur NONE DETECTED NONE DETECTED    Comment: (NOTE) Tricyclics + metabolites, urine    Cutoff 1000 ng/mL Amphetamines + metabolites, urine  Cutoff 1000 ng/mL MDMA (Ecstasy), urine              Cutoff 500 ng/mL Cocaine Metabolite, urine          Cutoff 300 ng/mL Opiate + metabolites, urine        Cutoff 300 ng/mL Phencyclidine (PCP), urine         Cutoff 25 ng/mL Cannabinoid, urine                 Cutoff 50 ng/mL Barbiturates + metabolites, urine  Cutoff 200 ng/mL Benzodiazepine, urine              Cutoff 200 ng/mL Methadone, urine                   Cutoff 300 ng/mL  The urine drug screen provides only a preliminary, unconfirmed analytical test result and should not be used for non-medical purposes. Clinical consideration and professional judgment should be applied to any positive drug screen result due to possible interfering substances. A more specific alternate chemical method must be used in order to obtain a confirmed analytical result. Gas chromatography / mass spectrometry (GC/MS) is the preferred confirm atory method. Performed at [facility], [address] [place],  [address]   SARS Coronavirus 2 by RT PCR (hospital order, performed in [facility] hospital lab) Nasopharyngeal Nasopharyngeal Swab     Status: None   Collection Time: 04/07/20 11:14 PM   Specimen: Nasopharyngeal Swab  Result Value Ref Range   SARS Coronavirus 2 NEGATIVE NEGATIVE    Comment: (NOTE) SARS-CoV-2 target nucleic  acids are NOT DETECTED.  The SARS-CoV-2 RNA is generally detectable in upper and lower respiratory specimens during the acute phase of infection. The lowest concentration of SARS-CoV-2 viral  copies this assay can detect is 250 copies / mL. A negative result does not preclude SARS-CoV-2 infection and should not be used as the sole basis for treatment or other patient management decisions.  A negative result may occur with improper specimen collection / handling, submission of specimen other than nasopharyngeal swab, presence of viral mutation(s) within the areas targeted by this assay, and inadequate number of viral copies (<250 copies / mL). A negative result must be combined with clinical observations, patient history, and epidemiological information.  Fact Sheet for Patients:   StrictlyIdeas.no  Fact Sheet for Healthcare Providers: BankingDealers.co.za  This test is not yet approved or  cleared by the [place] FDA and has been authorized for detection and/or diagnosis of SARS-CoV-2 by FDA under an Emergency Use Authorization (EUA).  This EUA will remain in effect (meaning this test can be used) for the duration of the COVID-19 declaration under Section 564(b)(1) of the Act, 21 U.S.C. section 360bbb-3(b)(1), unless the authorization is terminated or revoked sooner.  Performed at [facility], [address] [place], [place] [address]     No current facility-administered medications for this encounter.   Current Outpatient Medications  Medication Sig Dispense Refill  . albuterol (VENTOLIN HFA) 108 (90 Base) MCG/ACT inhaler Inhale 2 puffs into the lungs every 6 (six) hours as needed for wheezing or shortness of breath. 8 g 1  . azithromycin (ZITHROMAX) 250 MG tablet Take 1 a day for 4 days 4 each 0  . oxyCODONE-acetaminophen (ROXICET) 5-325 MG tablet Take 1 tablet by mouth every 4 (four) hours as needed for severe pain. 20 tablet 0    Musculoskeletal: Strength & Muscle Tone: within normal limits Gait & Station: normal Patient leans: N/A  Psychiatric Specialty Exam: Physical Exam Psychiatric:         Attention and Perception: Attention and perception normal.        Mood and Affect: Mood and affect normal.        Speech: Speech normal.        Behavior: Behavior normal. Behavior is cooperative.        Thought Content: Thought content normal.        Cognition and Memory: Cognition and memory normal.        Judgment: Judgment normal.     Review of Systems  Psychiatric/Behavioral: Positive for agitation.  All other systems reviewed and are negative.   Blood pressure (!) 164/87, pulse 71, temperature 98.7 F (37.1 C), temperature source Oral, resp. rate 18, height 5\' 10"  (1.778 m), weight 81.6 kg, SpO2 100 %.Body mass index is 25.83 kg/m.  General Appearance: Disheveled  Eye Contact:  Fair  Speech:  Clear and Coherent  Volume:  Normal  Mood:  Anxious  Affect:  Congruent  Thought Process:  Coherent  Orientation:  Full (Time, Place, and Person)  Thought Content:  Logical  Suicidal Thoughts:  No  Homicidal Thoughts:  No  Memory:  Immediate;   Good Recent;   Good Remote;   Good  Judgement:  Intact  Insight:  Fair  Psychomotor Activity:  Normal  Concentration:  Concentration: Fair and Attention Span: Fair  Recall:  Good  Fund of Knowledge:  Good  Language:  Good  Akathisia:  Negative  Handed:  Right  AIMS (if indicated):  Assets:  Communication Skills Desire for Improvement Housing Resilience Social Support  ADL's:  Intact  Cognition:  WNL  Sleep:        Treatment Plan Summary: Plan Patient does not meet criteria for psychiatric inpatient admission.  Disposition: No evidence of imminent risk to self or others at present.   Patient does not meet criteria for psychiatric inpatient admission. Supportive therapy provided about ongoing stressors. Refer to IOP. Discussed crisis plan, support from social network, calling 911, coming to the Emergency Department, and calling Suicide Hotline.  Caroline Sauger, NP 04/08/2020 1:31 AM

## 2020-04-09 NOTE — ED Notes (Signed)
Patient eating breakfast , states " I have to be discharged so I can get my belongings out of my apartment so they don't throw it away" nurse let him know that she would talk to Doctor regarding discharge papers.

## 2020-04-09 NOTE — ED Provider Notes (Signed)
Emergency Medicine Observation Re-evaluation Note  Timothy Matthews is a 74 y.o. male, seen on rounds today.  Pt initially presented to the ED for complaints of Opioid Problem Currently, the patient is awaiting placement at [facility].  He has a room just needs transportation but he says his landlord is going to throw out his belongings if he does not get them out of his apartment.  He has not paid the rent and that is why this is happening.  Patient needs to go and address the situation now.  He is can leave AMA.  Physical Exam  BP 135/84 (BP Location: Right Arm)   Pulse 79   Temp 98.6 F (37 C) (Oral)   Resp 18   Ht 5\' 10"  (1.778 m)   Wt 81.6 kg   SpO2 97%   BMI 25.83 kg/m  Physical Exam Awake alert oriented x3 at this point not short of breath looks well and is competent to make decisions ED Course / MDM  EKG:    I have reviewed the labs performed to date as well as medications administered while in observation.  Recent changes in the last 24 hours include patient is medically stable and has a place at [facility] is for patient is leaving AMA to take care of his belongings which his landlord will throw out if he does not do something about them as he has not paid his rent. Patient  under full IVC at this time.   Nena Polio, MD 04/09/20 [phone]

## 2020-04-09 NOTE — ED Provider Notes (Signed)
-----------------------------------------   7:51 AM on 04/09/2020 -----------------------------------------   Blood pressure 135/84, pulse 79, temperature 98.6 F (37 C), temperature source Oral, resp. rate 18, height 5\' 10"  (1.778 m), weight 81.6 kg, SpO2 97 %.  The patient is calm and cooperative at this time.  There have been no acute events since the last update.  Awaiting disposition plan from Behavioral Medicine and/or Social Work team(s).    Nena Polio, MD 04/09/20 [phone]

## 2020-04-09 NOTE — ED Provider Notes (Signed)
Patient is not committed.  He has a place at [facility] but says that he has to leave to take care of his stuff because he has not paid his rent and they are going to throw all his stuff out if he does not come and get it.  He wants to leave and do that.  He insists he is not homicidal or suicidal.  Medically he is stable he is not having any symptoms of withdrawal not having any trouble breathing.  He looks well.  He understands the it would be best for him to go straight to [facility] but he does have to take care of his belongings.  He will come back as soon as he can.   Nena Polio, MD 04/09/20 [phone]

## 2020-04-09 NOTE — Discharge Instructions (Addendum)
Please see if you can address your housing concerns and come back.  Remember you have a place at [facility] for help with your addiction.  Please return as needed.

## 2020-04-09 NOTE — ED Notes (Signed)
Patient voices understanding of discharge instructions, no signs of distress, but He did say ' I might try to come back to get help for my addiction, and depression at a later time, nurse let him know that He had been accepted to [facility] and it is not easy to get a bed, but He states " I have to leave and handle things with my landlord" all belongings given back to Patient.

## 2020-10-23 ENCOUNTER — Emergency Department: Payer: Medicare Other

## 2020-10-23 ENCOUNTER — Observation Stay: Admit: 2020-10-23 | Payer: Medicare Other

## 2020-10-23 ENCOUNTER — Other Ambulatory Visit: Payer: Self-pay

## 2020-10-23 ENCOUNTER — Encounter: Payer: Self-pay | Admitting: Internal Medicine

## 2020-10-23 ENCOUNTER — Inpatient Hospital Stay
Admission: EM | Admit: 2020-10-23 | Discharge: 2020-10-30 | DRG: 066 | Disposition: A | Discharge status: Home / Self Care | Payer: Medicare Other | Attending: Family Medicine | Admitting: Family Medicine

## 2020-10-23 DIAGNOSIS — H5500 Unspecified nystagmus: Secondary | ICD-10-CM

## 2020-10-23 DIAGNOSIS — H538 Other visual disturbances: Secondary | ICD-10-CM | POA: Diagnosis not present

## 2020-10-23 DIAGNOSIS — R001 Bradycardia, unspecified: Secondary | ICD-10-CM | POA: Diagnosis not present

## 2020-10-23 DIAGNOSIS — Z20822 Contact with and (suspected) exposure to covid-19: Secondary | ICD-10-CM | POA: Diagnosis present

## 2020-10-23 DIAGNOSIS — Z981 Arthrodesis status: Secondary | ICD-10-CM | POA: Diagnosis not present

## 2020-10-23 DIAGNOSIS — F4310 Post-traumatic stress disorder, unspecified: Secondary | ICD-10-CM | POA: Diagnosis present

## 2020-10-23 DIAGNOSIS — I10 Essential (primary) hypertension: Secondary | ICD-10-CM | POA: Diagnosis not present

## 2020-10-23 DIAGNOSIS — F1410 Cocaine abuse, uncomplicated: Secondary | ICD-10-CM | POA: Diagnosis present

## 2020-10-23 DIAGNOSIS — H5509 Other forms of nystagmus: Secondary | ICD-10-CM | POA: Diagnosis not present

## 2020-10-23 DIAGNOSIS — R29700 NIHSS score 0: Secondary | ICD-10-CM | POA: Diagnosis not present

## 2020-10-23 DIAGNOSIS — M25512 Pain in left shoulder: Secondary | ICD-10-CM | POA: Diagnosis not present

## 2020-10-23 DIAGNOSIS — E876 Hypokalemia: Secondary | ICD-10-CM | POA: Diagnosis not present

## 2020-10-23 DIAGNOSIS — R42 Dizziness and giddiness: Secondary | ICD-10-CM | POA: Diagnosis not present

## 2020-10-23 DIAGNOSIS — F17210 Nicotine dependence, cigarettes, uncomplicated: Secondary | ICD-10-CM | POA: Diagnosis not present

## 2020-10-23 DIAGNOSIS — I639 Cerebral infarction, unspecified: Principal | ICD-10-CM | POA: Diagnosis present

## 2020-10-23 DIAGNOSIS — K219 Gastro-esophageal reflux disease without esophagitis: Secondary | ICD-10-CM | POA: Diagnosis present

## 2020-10-23 DIAGNOSIS — Z720 Tobacco use: Secondary | ICD-10-CM

## 2020-10-23 DIAGNOSIS — N400 Enlarged prostate without lower urinary tract symptoms: Secondary | ICD-10-CM | POA: Diagnosis present

## 2020-10-23 DIAGNOSIS — R451 Restlessness and agitation: Secondary | ICD-10-CM | POA: Diagnosis not present

## 2020-10-23 DIAGNOSIS — J439 Emphysema, unspecified: Secondary | ICD-10-CM | POA: Diagnosis present

## 2020-10-23 DIAGNOSIS — J431 Panlobular emphysema: Secondary | ICD-10-CM | POA: Diagnosis not present

## 2020-10-23 DIAGNOSIS — R270 Ataxia, unspecified: Principal | ICD-10-CM | POA: Diagnosis present

## 2020-10-23 DIAGNOSIS — I6389 Other cerebral infarction: Secondary | ICD-10-CM | POA: Diagnosis not present

## 2020-10-23 DIAGNOSIS — Z8546 Personal history of malignant neoplasm of prostate: Secondary | ICD-10-CM

## 2020-10-23 DIAGNOSIS — R29818 Other symptoms and signs involving the nervous system: Secondary | ICD-10-CM | POA: Diagnosis not present

## 2020-10-23 HISTORY — DX: Post-traumatic stress disorder, unspecified: F43.10

## 2020-10-23 HISTORY — DX: Cocaine abuse, uncomplicated: F14.10

## 2020-10-23 HISTORY — DX: Malignant neoplasm of prostate: C61

## 2020-10-23 HISTORY — DX: Unspecified viral hepatitis C without hepatic coma: B19.20

## 2020-10-23 HISTORY — DX: Gastro-esophageal reflux disease without esophagitis: K21.9

## 2020-10-23 HISTORY — DX: Emphysema, unspecified: J43.9

## 2020-10-23 LAB — DIFFERENTIAL
Abs Immature Granulocytes: 0.02 10*3/uL (ref 0.00–0.07)
Basophils Absolute: 0 10*3/uL (ref 0.0–0.1)
Basophils Relative: 1 %
Eosinophils Absolute: 0.2 10*3/uL (ref 0.0–0.5)
Eosinophils Relative: 3 %
Immature Granulocytes: 0 %
Lymphocytes Relative: 34 %
Lymphs Abs: 2.1 10*3/uL (ref 0.7–4.0)
Monocytes Absolute: 0.6 10*3/uL (ref 0.1–1.0)
Monocytes Relative: 9 %
Neutro Abs: 3.3 10*3/uL (ref 1.7–7.7)
Neutrophils Relative %: 53 %

## 2020-10-23 LAB — CBC
HCT: 42.8 % (ref 39.0–52.0)
Hemoglobin: 13.6 g/dL (ref 13.0–17.0)
MCH: 27.4 pg (ref 26.0–34.0)
MCHC: 31.8 g/dL (ref 30.0–36.0)
MCV: 86.3 fL (ref 80.0–100.0)
Platelets: 217 10*3/uL (ref 150–400)
RBC: 4.96 MIL/uL (ref 4.22–5.81)
RDW: 14.6 % (ref 11.5–15.5)
WBC: 6.3 10*3/uL (ref 4.0–10.5)
nRBC: 0 % (ref 0.0–0.2)

## 2020-10-23 LAB — COMPREHENSIVE METABOLIC PANEL
ALT: 15 U/L (ref 0–44)
AST: 25 U/L (ref 15–41)
Albumin: 2.8 g/dL — ABNORMAL LOW (ref 3.5–5.0)
Alkaline Phosphatase: 56 U/L (ref 38–126)
Anion gap: 5 (ref 5–15)
BUN: 18 mg/dL (ref 8–23)
CO2: 25 mmol/L (ref 22–32)
Calcium: 7.8 mg/dL — ABNORMAL LOW (ref 8.9–10.3)
Chloride: 106 mmol/L (ref 98–111)
Creatinine, Ser: 0.98 mg/dL (ref 0.61–1.24)
GFR, Estimated: >60 mL/min (ref >60)
Glucose, Bld: 94 mg/dL (ref 70–99)
Potassium: 3.3 mmol/L — ABNORMAL LOW (ref 3.5–5.1)
Sodium: 136 mmol/L (ref 135–145)
Total Bilirubin: 0.4 mg/dL (ref 0.3–1.2)
Total Protein: 6.6 g/dL (ref 6.5–8.1)

## 2020-10-23 LAB — CBG MONITORING, ED: Glucose-Capillary: 83 mg/dL (ref 70–99)

## 2020-10-23 LAB — PROTIME-INR
INR: 1.1 (ref 0.8–1.2)
Prothrombin Time: 13.6 seconds (ref 11.4–15.2)

## 2020-10-23 LAB — RESP PANEL BY RT-PCR (FLU A&B, COVID) ARPGX2
Influenza A by PCR: NEGATIVE
Influenza B by PCR: NEGATIVE
SARS Coronavirus 2 by RT PCR: NEGATIVE

## 2020-10-23 LAB — APTT: aPTT: 33 seconds (ref 24–36)

## 2020-10-23 LAB — TROPONIN I (HIGH SENSITIVITY)
Troponin I (High Sensitivity): 10 ng/L (ref <18)
Troponin I (High Sensitivity): 11 ng/L (ref <18)

## 2020-10-23 LAB — TSH: TSH: 1.64 u[IU]/mL (ref 0.350–4.500)

## 2020-10-23 MED ORDER — ACETAMINOPHEN 650 MG RE SUPP: 650.0000 mg | RECTAL | Status: DC | PRN

## 2020-10-23 MED ORDER — IBUPROFEN 400 MG PO TABS
800.0000 mg | ORAL_TABLET | Freq: Every day | ORAL | Status: DC | PRN
  Administered 2020-10-23 – 2020-10-29 (×4): 800 mg via ORAL
  Filled 2020-10-23 (×3): qty 2
  Filled 2020-10-23: qty 1

## 2020-10-23 MED ORDER — AMLODIPINE BESYLATE 10 MG PO TABS
10.0000 mg | ORAL_TABLET | Freq: Every day | ORAL | Status: DC
  Administered 2020-10-24 – 2020-10-30 (×7): 10 mg via ORAL
  Filled 2020-10-23 (×2): qty 2
  Filled 2020-10-23 (×5): qty 1

## 2020-10-23 MED ORDER — SENNOSIDES-DOCUSATE SODIUM 8.6-50 MG PO TABS
1.0000 | ORAL_TABLET | Freq: Every day | ORAL | Status: DC
  Administered 2020-10-24 – 2020-10-28 (×5): 1 via ORAL
  Filled 2020-10-23 (×6): qty 1

## 2020-10-23 MED ORDER — ASPIRIN EC 325 MG PO TBEC
650.0000 mg | DELAYED_RELEASE_TABLET | Freq: Once | ORAL | Status: AC
  Administered 2020-10-23: 650 mg via ORAL
  Filled 2020-10-23: qty 2

## 2020-10-23 MED ORDER — IOHEXOL 350 MG/ML SOLN
75.0000 mL | Freq: Once | INTRAVENOUS | Status: AC | PRN
  Administered 2020-10-23: 75 mL via INTRAVENOUS

## 2020-10-23 MED ORDER — SODIUM CHLORIDE 0.9 % IV SOLN: INTRAVENOUS | Status: DC

## 2020-10-23 MED ORDER — POTASSIUM CHLORIDE CRYS ER 20 MEQ PO TBCR
40.0000 meq | EXTENDED_RELEASE_TABLET | Freq: Once | ORAL | Status: AC
  Administered 2020-10-23: 40 meq via ORAL
  Filled 2020-10-23: qty 2

## 2020-10-23 MED ORDER — ENOXAPARIN SODIUM 40 MG/0.4ML ~~LOC~~ SOLN
40.0000 mg | SUBCUTANEOUS | Status: DC
  Administered 2020-10-24 – 2020-10-29 (×7): 40 mg via SUBCUTANEOUS
  Filled 2020-10-23 (×7): qty 0.4

## 2020-10-23 MED ORDER — PANTOPRAZOLE SODIUM 40 MG PO TBEC
40.0000 mg | DELAYED_RELEASE_TABLET | Freq: Every day | ORAL | Status: DC
  Administered 2020-10-23 – 2020-10-30 (×8): 40 mg via ORAL
  Filled 2020-10-23 (×8): qty 1

## 2020-10-23 MED ORDER — ASPIRIN EC 325 MG PO TBEC
325.0000 mg | DELAYED_RELEASE_TABLET | Freq: Every day | ORAL | Status: DC
Start: 2020-10-23 — End: 2020-10-30
  Administered 2020-10-23 – 2020-10-30 (×8): 325 mg via ORAL
  Filled 2020-10-23 (×8): qty 1

## 2020-10-23 MED ORDER — GABAPENTIN 300 MG PO CAPS
300.0000 mg | ORAL_CAPSULE | Freq: Three times a day (TID) | ORAL | Status: DC
  Administered 2020-10-23 – 2020-10-30 (×21): 300 mg via ORAL
  Filled 2020-10-23 (×21): qty 1

## 2020-10-23 MED ORDER — ALBUTEROL SULFATE HFA 108 (90 BASE) MCG/ACT IN AERS
2.0000 | INHALATION_SPRAY | Freq: Four times a day (QID) | RESPIRATORY_TRACT | Status: DC | PRN
  Filled 2020-10-23: qty 6.7

## 2020-10-23 MED ORDER — LORATADINE 10 MG PO TABS
10.0000 mg | ORAL_TABLET | Freq: Every day | ORAL | Status: DC
  Administered 2020-10-23 – 2020-10-30 (×8): 10 mg via ORAL
  Filled 2020-10-23 (×8): qty 1

## 2020-10-23 MED ORDER — ACETAMINOPHEN 325 MG PO TABS
650.0000 mg | ORAL_TABLET | ORAL | Status: DC | PRN
  Administered 2020-10-25 – 2020-10-28 (×2): 650 mg via ORAL
  Filled 2020-10-23 (×2): qty 2

## 2020-10-23 MED ORDER — STROKE: EARLY STAGES OF RECOVERY BOOK: Freq: Once | Status: DC

## 2020-10-23 MED ORDER — SODIUM CHLORIDE 0.9% FLUSH
3.0000 mL | Freq: Once | INTRAVENOUS | Status: AC
  Administered 2020-10-23: 3 mL via INTRAVENOUS

## 2020-10-23 MED ORDER — ACETAMINOPHEN 160 MG/5ML PO SOLN
650.0000 mg | ORAL | Status: DC | PRN
  Filled 2020-10-23: qty 20.3

## 2020-10-23 MED ORDER — TAMSULOSIN HCL 0.4 MG PO CAPS
0.8000 mg | ORAL_CAPSULE | Freq: Every day | ORAL | Status: DC
  Administered 2020-10-23 – 2020-10-30 (×8): 0.8 mg via ORAL
  Filled 2020-10-23 (×8): qty 2

## 2020-10-23 NOTE — ED Notes (Signed)
Medication Reconciliation Report  For Home History Technicians  HIGHLIGHTS:  1. The patient WAS NOT personally interviewed 2. If not, what was the main source used: PHARMACY RECORDS 3. Does the patient appear to take any anti-coagulation agents (e.g. warfarin, Eliquis or Xarelto): NO 4. Does the patient appear to take any anti-convulsant agents (e.g. divalproex, levetiracetam or phenytoin): NO 5. Does the patient appear to use any insulin products (e.g. Lantus, Novolin or Humalog): NO 6. Does the patient appear to take any "beta-blockers" (e.g. metoprolol, carvedilol or bisoprolol: NO  BARRIERS:  1. Were there any barriers that prevented or complicated the medication reconciliation process: YES 2. If yes, what was the primary barrier encountered: Poor historian 3. Does the patient appear compliant with prescribed medications: NO 4. Does the patient express any barriers with compliance: UNABLE TO DETERMINE 5. What is the primary barrier the patient reports: None   NOTES:[Include any concerns, remarks or complaints the patient expresses regarding medication therapy. Any observations or other information that might be useful to the treatment team can also be included. Immediate needs or concerns should be referred to the RN or appropriate member of the treatment team.]  Patient reports receiving medications from [facility]. Contacted pharmacy and was provided available records which indicate last refills were July 2021.   Colen Darling, CPhT [place] at [facility] [place]. [place], [place] [address] [phone]  ** The above is intended solely for informational and/or communicative purposes. It should in no way be considered an endorsement of any specific treatment, therapy or action. **

## 2020-10-23 NOTE — ED Notes (Signed)
CODE  STROKE  CALLED  TO [phone]  AND  CARELINK 

## 2020-10-23 NOTE — H&P (Signed)
History and Physical    Timothy Matthews YPP:[id number] DOB: [birth year] DOA: 10/23/2020  PCP: Will Bonnet, MD   Patient coming from: Home  I have personally briefly reviewed patient's old medical records in [place]  Chief Complaint: Vertigo, double vision.  HPI: Timothy Matthews is a 75 y.o. male with medical history significant of HTN, prostate  Cancer, emphysema, cocaine abuse, PTSD who presents to the ED with acute onset of vertigo, gait unsteadiness and double vision. Symptoms began at 9 AM while in the shower, for which he called his wife. After she saw his condition, they decided to go to the ED. While walking into the ED waiting room, the patient was noted to be very unsteady and almost fell. RN caught the patient and assisted him to a wheelchair.   ED Course: T 98  173/86  HR reported as 28  RR 18. Code stroke called on presentation. CT head w/o acute findings, CTA head/neck - w/o LVO, mild segmental irregularity circle-of-willis. Neurology evaluated patient - offered TPA which was declined. TRH called to admit patient to complete stroke work-up, evaluation and continued treatment.   Review of Systems: As per HPI otherwise 10 point review of systems negative.    Past Medical History:  Diagnosis Date  . Cancer ([place])   . Cocaine abuse ([place])    last ED encounter 04/07/20  . Emphysema lung ([place])    pan-lobar by CT 2017  . GERD (gastroesophageal reflux disease)   . Hepatitis C   . Hypertension   . Prostate cancer ([place]) BPH  . PTSD (post-traumatic stress disorder)     Past Surgical History:  Procedure Laterality Date  . CERVICAL SPINE SURGERY  2017   Done at [facility]  . LUMBAR SPINE SURGERY    . TONSILLECTOMY    . TRANSURETHRAL RESECTION OF PROSTATE      Soc Hx - served 4 years [employer] [place] in [place] Has PTSD from experience. Receives care from [place]. Married '84-'86 - divorced. Remarried same woman in '92. Remains married but is seperated. Has no  children but an "adopted" son was murdered in Nov '21 at age 73.    reports that he has been smoking cigarettes. He has never used smokeless tobacco. He reports previous alcohol use. He reports current drug use. Drugs: Cocaine and Marijuana.  No Known Allergies  Family History  Problem Relation Age of Onset  . Breast cancer Mother   . Diabetes Mother   . Glaucoma Mother   . Alcohol abuse Father   . Pancreatic cancer Father      Prior to Admission medications   Medication Sig Start Date End Date Taking? Authorizing Provider  albuterol (VENTOLIN HFA) 108 (90 Base) MCG/ACT inhaler Inhale 2 puffs into the lungs every 6 (six) hours as needed for wheezing or shortness of breath. 08/03/19   Rudene Re, MD  azithromycin [facility]) 250 MG tablet Take 1 a day for 4 days 08/03/19   Alfred Levins, [place], MD  oxyCODONE-acetaminophen (ROXICET) 5-325 MG tablet Take 1 tablet by mouth every 4 (four) hours as needed for severe pain. 06/06/16   Gregor Hams, MD    Physical Exam: Vitals:   10/23/20 1123 10/23/20 1300  BP: (!) 176/90 (!) 173/86  Pulse: (!) 53 (!) 29  Resp: 18 18  Temp: 98 F (36.7 C)   SpO2: 100% 100%     Vitals:   10/23/20 1123 10/23/20 1300  BP: (!) 176/90 (!) 173/86  Pulse: (!) 53 (!) 29  Resp: 18 18  Temp: 98 F (36.7 C)   SpO2: 100% 100%   General: WNWD man in no distress Eyes: PERRL, lids normal, muddy bulbar conjunctiva ENMT: Mucous membranes are moist. Posterior pharynx clear of any exudate or lesions.Normal dentition.  Neck: normal, supple, no masses, no thyromegaly Respiratory: clear to auscultation bilaterally, no wheezing, no crackles. Normal respiratory effort. No accessory muscle use.  Cardiovascular: Regular rate and rhythm, no murmurs / rubs / gallops. No extremity edema. 2+ pedal pulses. No carotid bruits.  Abdomen: no tenderness, no masses palpated. No hepatosplenomegaly. Bowel sounds positive.  Musculoskeletal: no clubbing / cyanosis. No  joint deformity upper and lower extremities. Good ROM, no contractures. Normal muscle tone.  Skin: no rashes, lesions, ulcers. No induration. Abrasion left elbow, left shin Neurologic: CN 2-12: nl facial symmetry and movement, palsy OD with leftward gaze - lags and does not move fully left, vertical nystagmus with upward gaze -slow beat. Vision not tested. Nl Shoulder shrug. MS - 5/5 throughout. DTR 2+ biceps, patellar tendons. Sensation intact to light touch Psychiatric: Normal judgment and insight. Alert and oriented x 3. Normal mood.     Labs on Admission: I have personally reviewed following labs and imaging studies  CBC: Recent Labs  Lab 10/23/20 1210  WBC 6.3  NEUTROABS 3.3  HGB 13.6  HCT 42.8  MCV 86.3  PLT 217   Basic Metabolic Panel: Recent Labs  Lab 10/23/20 1210  NA 136  K 3.3*  CL 106  CO2 25  GLUCOSE 94  BUN 18  CREATININE 0.98  CALCIUM 7.8*   GFR: CrCl cannot be calculated (Unknown ideal weight.). Liver Function Tests: Recent Labs  Lab 10/23/20 1210  AST 25  ALT 15  ALKPHOS 56  BILITOT 0.4  PROT 6.6  ALBUMIN 2.8*   No results for input(s): LIPASE, AMYLASE in the last 168 hours. No results for input(s): AMMONIA in the last 168 hours. Coagulation Profile: Recent Labs  Lab 10/23/20 1210  INR 1.1   Cardiac Enzymes: No results for input(s): CKTOTAL, CKMB, CKMBINDEX, TROPONINI in the last 168 hours. BNP (last 3 results) No results for input(s): PROBNP in the last 8760 hours. HbA1C: No results for input(s): HGBA1C in the last 72 hours. CBG: Recent Labs  Lab 10/23/20 1125  GLUCAP 83   Lipid Profile: No results for input(s): CHOL, HDL, LDLCALC, TRIG, CHOLHDL, LDLDIRECT in the last 72 hours. Thyroid Function Tests: Recent Labs    10/23/20 1210  TSH 1.640   Anemia Panel: No results for input(s): VITAMINB12, FOLATE, FERRITIN, TIBC, IRON, RETICCTPCT in the last 72 hours. Urine analysis:    Component Value Date/Time   COLORURINE YELLOW  (A) 03/20/2020 1209   APPEARANCEUR CLEAR (A) 03/20/2020 1209   APPEARANCEUR Clear 09/21/2014 1319   LABSPEC 1.017 03/20/2020 1209   LABSPEC 1.021 09/21/2014 1319   PHURINE 5.0 03/20/2020 1209   GLUCOSEU NEGATIVE 03/20/2020 1209   GLUCOSEU Negative 09/21/2014 1319   HGBUR SMALL (A) 03/20/2020 1209   BILIRUBINUR NEGATIVE 03/20/2020 1209   BILIRUBINUR Negative 09/21/2014 1319   KETONESUR NEGATIVE 03/20/2020 1209   PROTEINUR NEGATIVE 03/20/2020 1209   NITRITE NEGATIVE 03/20/2020 1209   LEUKOCYTESUR NEGATIVE 03/20/2020 1209   LEUKOCYTESUR Negative 09/21/2014 1319    Radiological Exams on Admission: CT ANGIO HEAD W OR WO CONTRAST  Result Date: 10/23/2020 CLINICAL DATA:  Dizziness.  Blurred vision.  Unsteady gait. EXAM: CT ANGIOGRAPHY HEAD AND NECK TECHNIQUE: Multidetector CT imaging of the head  and neck was performed using the standard protocol during bolus administration of intravenous contrast. Multiplanar CT image reconstructions and MIPs were obtained to evaluate the vascular anatomy. Carotid stenosis measurements (when applicable) are obtained utilizing NASCET criteria, using the distal internal carotid diameter as the denominator. CONTRAST:  33mL OMNIPAQUE IOHEXOL 350 MG/ML SOLN COMPARISON:  CT head without contrast 10/23/2020 FINDINGS: CTA NECK FINDINGS Aortic arch: Minimal atherosclerotic changes are present within the aortic arch. Three vessel arch configuration is present. No aneurysm stenosis is present. Right carotid system: Right common carotid artery is mildly tortuous without significant stenosis. Bifurcation is unremarkable. Cervical right ICA is normal. Left carotid system: The left common carotid artery within normal limits. Bifurcation is unremarkable. Cervical left ICA is within limits. Vertebral arteries: The right vertebral artery is the dominant vessel. Both vertebral arteries originate from the subclavian arteries without significant stenosis. Is no significant stenosis in  either vertebral artery in the neck. Skeleton: Anterior fusion is noted C3-7. Posterior hardware extends to the T2 level. Posterior fusion is present through at least T1. There is probable posterior fusion at T1-2. Hardware is intact. No focal lytic or blastic lesions are present. Other neck: Soft tissues the neck are unremarkable. Upper chest: Paraseptal emphysematous changes are noted at the lung apices. Centrilobular emphysematous changes are present as well. No focal nodule or mass lesion is present. The thoracic inlet is within normal limits. Review of the MIP images confirms the above findings CTA HEAD FINDINGS Anterior circulation: The internal carotid arteries are within limits the skull base through the ICA termini. The A1 segments are. The anterior communicating artery is not definitively seen. MCA bifurcations are intact. Is some segmental irregularity without a discrete proximal stenosis branch vessel occlusion. Posterior circulation: The right vertebral artery is dominant. PICA origins are visualized and within normal limits. The vertebrobasilar junction is normal. Basilar artery is normal. Both posterior cerebral arteries originate basilar tip. Right posterior communicating artery contributes. PCA branch vessels are within normal. Venous sinuses: The dural sinuses are patent. Straight sinus deep cerebral veins are intact. Cortical veins are within normal limits. No vascular malformation present. Anatomic variants: None Review of the MIP images confirms the above findings IMPRESSION: 1. No emergent large vessel occlusion. 2. Mild segmental irregularity of the circle-of-Willis without a discrete proximal stenosis, aneurysm, or branch vessel occlusion. 3. Minimal atherosclerotic changes at the aortic arch without significant stenosis. 4. Mild tortuosity of the cervical vasculature without significant stenosis in the neck. 5. Postoperative changes of the cervical spine as described. 6. Emphysema  (ICD10-J43.9). Electronically Signed   By: San Morelle M.D.   On: 10/23/2020 12:37   DG Chest 2 View  Result Date: 10/23/2020 CLINICAL DATA:  Left shoulder pain Dizziness Blurry vision EXAM: CHEST - 2 VIEW COMPARISON:  10/23/2020 FINDINGS: Cardiomediastinal silhouette and pulmonary vasculature are within normal limits. Cervical fusion hardware partially visualized. Increased interstitial opacities seen throughout both lungs, unchanged from prior examination suggestive of chronic interstitial pneumonitis. IMPRESSION: No acute cardiopulmonary process. Unchanged increased interstitial opacity seen throughout both lungs. Electronically Signed   By: Miachel Roux M.D.   On: 10/23/2020 12:44   CT ANGIO NECK W OR WO CONTRAST  Result Date: 10/23/2020 CLINICAL DATA:  Dizziness.  Blurred vision.  Unsteady gait. EXAM: CT ANGIOGRAPHY HEAD AND NECK TECHNIQUE: Multidetector CT imaging of the head and neck was performed using the standard protocol during bolus administration of intravenous contrast. Multiplanar CT image reconstructions and MIPs were obtained to evaluate the vascular anatomy. Carotid  stenosis measurements (when applicable) are obtained utilizing NASCET criteria, using the distal internal carotid diameter as the denominator. CONTRAST:  96mL OMNIPAQUE IOHEXOL 350 MG/ML SOLN COMPARISON:  CT head without contrast 10/23/2020 FINDINGS: CTA NECK FINDINGS Aortic arch: Minimal atherosclerotic changes are present within the aortic arch. Three vessel arch configuration is present. No aneurysm stenosis is present. Right carotid system: Right common carotid artery is mildly tortuous without significant stenosis. Bifurcation is unremarkable. Cervical right ICA is normal. Left carotid system: The left common carotid artery within normal limits. Bifurcation is unremarkable. Cervical left ICA is within limits. Vertebral arteries: The right vertebral artery is the dominant vessel. Both vertebral arteries originate  from the subclavian arteries without significant stenosis. Is no significant stenosis in either vertebral artery in the neck. Skeleton: Anterior fusion is noted C3-7. Posterior hardware extends to the T2 level. Posterior fusion is present through at least T1. There is probable posterior fusion at T1-2. Hardware is intact. No focal lytic or blastic lesions are present. Other neck: Soft tissues the neck are unremarkable. Upper chest: Paraseptal emphysematous changes are noted at the lung apices. Centrilobular emphysematous changes are present as well. No focal nodule or mass lesion is present. The thoracic inlet is within normal limits. Review of the MIP images confirms the above findings CTA HEAD FINDINGS Anterior circulation: The internal carotid arteries are within limits the skull base through the ICA termini. The A1 segments are. The anterior communicating artery is not definitively seen. MCA bifurcations are intact. Is some segmental irregularity without a discrete proximal stenosis branch vessel occlusion. Posterior circulation: The right vertebral artery is dominant. PICA origins are visualized and within normal limits. The vertebrobasilar junction is normal. Basilar artery is normal. Both posterior cerebral arteries originate basilar tip. Right posterior communicating artery contributes. PCA branch vessels are within normal. Venous sinuses: The dural sinuses are patent. Straight sinus deep cerebral veins are intact. Cortical veins are within normal limits. No vascular malformation present. Anatomic variants: None Review of the MIP images confirms the above findings IMPRESSION: 1. No emergent large vessel occlusion. 2. Mild segmental irregularity of the circle-of-Willis without a discrete proximal stenosis, aneurysm, or branch vessel occlusion. 3. Minimal atherosclerotic changes at the aortic arch without significant stenosis. 4. Mild tortuosity of the cervical vasculature without significant stenosis in the  neck. 5. Postoperative changes of the cervical spine as described. 6. Emphysema (ICD10-J43.9). Electronically Signed   By: San Morelle M.D.   On: 10/23/2020 12:37   MR BRAIN WO CONTRAST  Result Date: 10/23/2020 CLINICAL DATA:  Acute neuro deficit. Blurred vision and dizziness. History of prostate cancer. EXAM: MRI HEAD WITHOUT CONTRAST TECHNIQUE: Multiplanar, multiecho pulse sequences of the brain and surrounding structures were obtained without intravenous contrast. COMPARISON:  None. FINDINGS: Brain: Negative for acute infarct. Mild white matter changes bilaterally. Brainstem and cerebellum normal. Negative for hemorrhage or mass. Ventricle size normal. Vascular: Normal arterial flow voids Skull and upper cervical spine: No focal skeletal lesion. Surgical hardware in the posterior cervical spine. Sinuses/Orbits: Paranasal sinuses clear.  Negative orbit Other: None IMPRESSION: Negative for acute infarct. Mild chronic white matter changes bilaterally. Electronically Signed   By: Franchot Gallo M.D.   On: 10/23/2020 15:00   CT HEAD CODE STROKE WO CONTRAST  Addendum Date: 10/23/2020   ADDENDUM REPORT: 10/23/2020 11:47 ADDENDUM: Study discussed by telephone with Dr. Cherylann Banas in the ED on 10/23/2020 at 1144 hours. Electronically Signed   By: Genevie Ann M.D.   On: 10/23/2020 11:47   Result  Date: 10/23/2020 CLINICAL DATA:  Code stroke. 75 year old male with blurred vision and dizziness. Hypertensive. EXAM: CT HEAD WITHOUT CONTRAST TECHNIQUE: Contiguous axial images were obtained from the base of the skull through the vertex without intravenous contrast. COMPARISON:  None. FINDINGS: Brain: Cerebral volume is within normal limits for age. No midline shift, ventriculomegaly, mass effect, evidence of mass lesion, intracranial hemorrhage or evidence of cortically based acute infarction. Patchy and confluent bilateral cerebral white matter hypodensity. Deep gray nuclei appear spared. No cortical encephalomalacia  identified. Vascular: No suspicious intracranial vascular hyperdensity. Skull: Negative. Partially visible cervical spine fusion changes on the scout view. Sinuses/Orbits: Visible paranasal sinuses are clear. Tympanic cavities also appear clear. Some chronic sclerotic changes are noted to the mastoids which are relatively well pneumatized. Other: Visualized orbits and scalp soft tissues are within normal limits. ASPECTS [facility] Stroke Program Early CT Score) Total score (0-10 with 10 being normal): 10 IMPRESSION: 1. No acute cortically based infarct or acute intracranial hemorrhage identified. ASPECTS 10. 2. 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 Electronically Signed: By: Genevie Ann M.D. On: 10/23/2020 11:40    EKG: Independently reviewed. Sinus Bradycardia at 52 with LAFB. No acute changes  Assessment/Plan Active Problems:   CVA (cerebral vascular accident) ([place])   Cocaine abuse ([place])   HTN (hypertension)   Emphysema lung ([facility])   GERD (gastroesophageal reflux disease)    1. CVA - per neurology most likely brainstem event vs cerebellar event. Pt offered and declined TPA. Plan Telemetry admission  PT/OT and speech evaluation  TTE  ASA treatment  Smoking cessation, cocaine cessation.  2. HTN - permissive hypertension during first 24 hours. Plan Will resume home meds - list from [place] currently pending  3. Emphysema - established problem Plan Smoking cessation  Continue albuterol MDI prn  4. Cocaine abuse - long standing problem Plan For agitation/restlessness Ativan q4  5. GERD - long standing problem. NO report of esophagitis. Plan PPI tx  DVT prophylaxis: lovenox  Code Status: full code  Family Communication: wife at bedside - discussed Dx and Tx plan. She does not live with patient but can look in on him. Disposition Plan: home when medically stable. F/u at [place] called: Neurolology - Dr. Cheral Marker  Admission  status: Observation -tele    Adella Hare MD [facility] Pager [phone]  If 7PM-7AM, please contact night-coverage [email or web address] Password [facility]  10/23/2020, 3:13 PM

## 2020-10-23 NOTE — ED Triage Notes (Addendum)
Pt states blurry vision, dizziness and almost fell walking in to waiting room. This RN caught pt and assisted to wheelchair. LKW was 0900

## 2020-10-23 NOTE — ED Provider Notes (Signed)
[facility] Emergency Department Provider Note  ____________________________________________   Event Date/Time   First MD Initiated Contact with Patient 10/23/20 1134     (approximate)  I have reviewed the triage vital signs and the nursing notes.   HISTORY  Chief Complaint Code Stroke   HPI Timothy Matthews is a 75 y.o. male with past medical history of HTN, depression, and cancer who presents for assessment of blurry vision and dizziness that began this morning.  Patient was last known normal around 9 AM today.  Patient denies any recent traumatic injuries or falls.  He does endorse feeling mild soreness in his left shoulder and posterior back which he attributes to sleeping on it abnormally 2 days ago.  He is not on any blood thinners.  He denies any other weakness or other acute symptoms including fevers, chills,, chest pain, cough, shortness of breath, abdominal pain, back pain, nausea, vomiting, diarrhea, dysuria, rash or other acute sick symptoms.  Denies EtOH use or illicit drug use but does endorse tobacco abuse.         Past Medical History:  Diagnosis Date  . Cancer ([place])   . Hypertension     Patient Active Problem List   Diagnosis Date Noted  . MDD (major depressive disorder) 04/08/2020    No past surgical history on file.  Prior to Admission medications   Medication Sig Start Date End Date Taking? Authorizing Provider  albuterol (VENTOLIN HFA) 108 (90 Base) MCG/ACT inhaler Inhale 2 puffs into the lungs every 6 (six) hours as needed for wheezing or shortness of breath. 08/03/19   Rudene Re, MD  azithromycin [facility]) 250 MG tablet Take 1 a day for 4 days 08/03/19   Alfred Levins, [place], MD  oxyCODONE-acetaminophen (ROXICET) 5-325 MG tablet Take 1 tablet by mouth every 4 (four) hours as needed for severe pain. 06/06/16   Gregor Hams, MD    Allergies Patient has no known allergies.  No family history on file.  Social  History Social History   Tobacco Use  . Smoking status: Current Every Day Smoker    Types: Cigarettes  . Smokeless tobacco: Never Used  Substance Use Topics  . Alcohol use: Not Currently  . Drug use: Yes    Types: Cocaine, Marijuana    Review of Systems  Review of Systems  Constitutional: Negative for chills and fever.  HENT: Negative for sore throat.   Eyes: Positive for blurred vision. Negative for pain.  Respiratory: Negative for cough and stridor.   Cardiovascular: Negative for chest pain.  Gastrointestinal: Negative for vomiting.  Skin: Negative for rash.  Neurological: Positive for dizziness. Negative for seizures, loss of consciousness and headaches.  Psychiatric/Behavioral: Negative for suicidal ideas.  All other systems reviewed and are negative.     ____________________________________________   PHYSICAL EXAM:  VITAL SIGNS: ED Triage Vitals [10/23/20 1123]  Enc Vitals Group     BP (!) 176/90     Pulse Rate (!) 53     Resp 18     Temp 98 F (36.7 C)     Temp src      SpO2 100 %     Weight      Height      Head Circumference      Peak Flow      Pain Score      Pain Loc      Pain Edu?      Excl. in [place]?    Vitals:  10/23/20 1123 10/23/20 1300  BP: (!) 176/90 (!) 173/86  Pulse: (!) 53 (!) 29  Resp: 18 18  Temp: 98 F (36.7 C)   SpO2: 100% 100%   Physical Exam Vitals and nursing note reviewed.  Constitutional:      Appearance: He is well-developed and well-nourished.  HENT:     Head: Normocephalic and atraumatic.     Right Ear: External ear normal.     Left Ear: External ear normal.     Nose: Nose normal.  Eyes:     Conjunctiva/sclera: Conjunctivae normal.  Cardiovascular:     Rate and Rhythm: Normal rate and regular rhythm.     Heart sounds: No murmur heard.   Pulmonary:     Effort: Pulmonary effort is normal. No respiratory distress.     Breath sounds: Normal breath sounds.  Abdominal:     Palpations: Abdomen is soft.      Tenderness: There is no abdominal tenderness.  Musculoskeletal:        General: No edema.     Cervical back: Neck supple.  Skin:    General: Skin is warm and dry.     Capillary Refill: Capillary refill takes less than 2 seconds.  Neurological:     Mental Status: He is alert and oriented to person, place, and time.     Gait: Gait abnormal.  Psychiatric:        Mood and Affect: Mood and affect and mood normal.     No pronator drift.  No finger dysmetria.  Patient has full strength and sensation throughout his bilateral upper and lower extremities.  Plus bilateral radial pulses.  Left shoulder is unremarkable patient has full range of motion otherwise some mild tenderness over the left posterior trapezius.  No overlying skin changes.  There is no facial droop and cranial nerves II through XII appear grossly intact with exception of some lateral nystagmus noted when patient is abducting as to the left.  He was also observed to ambulate with very unsteady gait balance falling ____________________________________________   LABS (all labs ordered are listed, but only abnormal results are displayed)  Labs Reviewed  COMPREHENSIVE METABOLIC PANEL - Abnormal; Notable for the following components:      Result Value   Potassium 3.3 (*)    Calcium 7.8 (*)    Albumin 2.8 (*)    All other components within normal limits  RESP PANEL BY RT-PCR (FLU A&B, COVID) ARPGX2  PROTIME-INR  APTT  CBC  DIFFERENTIAL  TSH  CBG MONITORING, ED  CBG MONITORING, ED  I-STAT CREATININE, ED  TROPONIN I (HIGH SENSITIVITY)  TROPONIN I (HIGH SENSITIVITY)   ____________________________________________  EKG  Sinus rhythm with a ventricular rate of 52, left anterior fascicle block and some nonspecific T wave changes throughout. ____________________________________________  RADIOLOGY  ED MD interpretation: CT head shows no evidence of acute hemorrhage.  No large vessel occlusion seen on CTA  imaging.  Official radiology report(s): CT ANGIO HEAD W OR WO CONTRAST  Result Date: 10/23/2020 CLINICAL DATA:  Dizziness.  Blurred vision.  Unsteady gait. EXAM: CT ANGIOGRAPHY HEAD AND NECK TECHNIQUE: Multidetector CT imaging of the head and neck was performed using the standard protocol during bolus administration of intravenous contrast. Multiplanar CT image reconstructions and MIPs were obtained to evaluate the vascular anatomy. Carotid stenosis measurements (when applicable) are obtained utilizing NASCET criteria, using the distal internal carotid diameter as the denominator. CONTRAST:  63mL OMNIPAQUE IOHEXOL 350 MG/ML SOLN COMPARISON:  CT head without  contrast 10/23/2020 FINDINGS: CTA NECK FINDINGS Aortic arch: Minimal atherosclerotic changes are present within the aortic arch. Three vessel arch configuration is present. No aneurysm stenosis is present. Right carotid system: Right common carotid artery is mildly tortuous without significant stenosis. Bifurcation is unremarkable. Cervical right ICA is normal. Left carotid system: The left common carotid artery within normal limits. Bifurcation is unremarkable. Cervical left ICA is within limits. Vertebral arteries: The right vertebral artery is the dominant vessel. Both vertebral arteries originate from the subclavian arteries without significant stenosis. Is no significant stenosis in either vertebral artery in the neck. Skeleton: Anterior fusion is noted C3-7. Posterior hardware extends to the T2 level. Posterior fusion is present through at least T1. There is probable posterior fusion at T1-2. Hardware is intact. No focal lytic or blastic lesions are present. Other neck: Soft tissues the neck are unremarkable. Upper chest: Paraseptal emphysematous changes are noted at the lung apices. Centrilobular emphysematous changes are present as well. No focal nodule or mass lesion is present. The thoracic inlet is within normal limits. Review of the MIP images  confirms the above findings CTA HEAD FINDINGS Anterior circulation: The internal carotid arteries are within limits the skull base through the ICA termini. The A1 segments are. The anterior communicating artery is not definitively seen. MCA bifurcations are intact. Is some segmental irregularity without a discrete proximal stenosis branch vessel occlusion. Posterior circulation: The right vertebral artery is dominant. PICA origins are visualized and within normal limits. The vertebrobasilar junction is normal. Basilar artery is normal. Both posterior cerebral arteries originate basilar tip. Right posterior communicating artery contributes. PCA branch vessels are within normal. Venous sinuses: The dural sinuses are patent. Straight sinus deep cerebral veins are intact. Cortical veins are within normal limits. No vascular malformation present. Anatomic variants: None Review of the MIP images confirms the above findings IMPRESSION: 1. No emergent large vessel occlusion. 2. Mild segmental irregularity of the circle-of-Willis without a discrete proximal stenosis, aneurysm, or branch vessel occlusion. 3. Minimal atherosclerotic changes at the aortic arch without significant stenosis. 4. Mild tortuosity of the cervical vasculature without significant stenosis in the neck. 5. Postoperative changes of the cervical spine as described. 6. Emphysema (ICD10-J43.9). Electronically Signed   By: San Morelle M.D.   On: 10/23/2020 12:37   DG Chest 2 View  Result Date: 10/23/2020 CLINICAL DATA:  Left shoulder pain Dizziness Blurry vision EXAM: CHEST - 2 VIEW COMPARISON:  10/23/2020 FINDINGS: Cardiomediastinal silhouette and pulmonary vasculature are within normal limits. Cervical fusion hardware partially visualized. Increased interstitial opacities seen throughout both lungs, unchanged from prior examination suggestive of chronic interstitial pneumonitis. IMPRESSION: No acute cardiopulmonary process. Unchanged increased  interstitial opacity seen throughout both lungs. Electronically Signed   By: Miachel Roux M.D.   On: 10/23/2020 12:44   CT ANGIO NECK W OR WO CONTRAST  Result Date: 10/23/2020 CLINICAL DATA:  Dizziness.  Blurred vision.  Unsteady gait. EXAM: CT ANGIOGRAPHY HEAD AND NECK TECHNIQUE: Multidetector CT imaging of the head and neck was performed using the standard protocol during bolus administration of intravenous contrast. Multiplanar CT image reconstructions and MIPs were obtained to evaluate the vascular anatomy. Carotid stenosis measurements (when applicable) are obtained utilizing NASCET criteria, using the distal internal carotid diameter as the denominator. CONTRAST:  40mL OMNIPAQUE IOHEXOL 350 MG/ML SOLN COMPARISON:  CT head without contrast 10/23/2020 FINDINGS: CTA NECK FINDINGS Aortic arch: Minimal atherosclerotic changes are present within the aortic arch. Three vessel arch configuration is present. No aneurysm stenosis is present.  Right carotid system: Right common carotid artery is mildly tortuous without significant stenosis. Bifurcation is unremarkable. Cervical right ICA is normal. Left carotid system: The left common carotid artery within normal limits. Bifurcation is unremarkable. Cervical left ICA is within limits. Vertebral arteries: The right vertebral artery is the dominant vessel. Both vertebral arteries originate from the subclavian arteries without significant stenosis. Is no significant stenosis in either vertebral artery in the neck. Skeleton: Anterior fusion is noted C3-7. Posterior hardware extends to the T2 level. Posterior fusion is present through at least T1. There is probable posterior fusion at T1-2. Hardware is intact. No focal lytic or blastic lesions are present. Other neck: Soft tissues the neck are unremarkable. Upper chest: Paraseptal emphysematous changes are noted at the lung apices. Centrilobular emphysematous changes are present as well. No focal nodule or mass lesion is  present. The thoracic inlet is within normal limits. Review of the MIP images confirms the above findings CTA HEAD FINDINGS Anterior circulation: The internal carotid arteries are within limits the skull base through the ICA termini. The A1 segments are. The anterior communicating artery is not definitively seen. MCA bifurcations are intact. Is some segmental irregularity without a discrete proximal stenosis branch vessel occlusion. Posterior circulation: The right vertebral artery is dominant. PICA origins are visualized and within normal limits. The vertebrobasilar junction is normal. Basilar artery is normal. Both posterior cerebral arteries originate basilar tip. Right posterior communicating artery contributes. PCA branch vessels are within normal. Venous sinuses: The dural sinuses are patent. Straight sinus deep cerebral veins are intact. Cortical veins are within normal limits. No vascular malformation present. Anatomic variants: None Review of the MIP images confirms the above findings IMPRESSION: 1. No emergent large vessel occlusion. 2. Mild segmental irregularity of the circle-of-Willis without a discrete proximal stenosis, aneurysm, or branch vessel occlusion. 3. Minimal atherosclerotic changes at the aortic arch without significant stenosis. 4. Mild tortuosity of the cervical vasculature without significant stenosis in the neck. 5. Postoperative changes of the cervical spine as described. 6. Emphysema (ICD10-J43.9). Electronically Signed   By: San Morelle M.D.   On: 10/23/2020 12:37   CT HEAD CODE STROKE WO CONTRAST  Addendum Date: 10/23/2020   ADDENDUM REPORT: 10/23/2020 11:47 ADDENDUM: Study discussed by telephone with Dr. Cherylann Banas in the ED on 10/23/2020 at 1144 hours. Electronically Signed   By: Genevie Ann M.D.   On: 10/23/2020 11:47   Result Date: 10/23/2020 CLINICAL DATA:  Code stroke. 75 year old male with blurred vision and dizziness. Hypertensive. EXAM: CT HEAD WITHOUT CONTRAST  TECHNIQUE: Contiguous axial images were obtained from the base of the skull through the vertex without intravenous contrast. COMPARISON:  None. FINDINGS: Brain: Cerebral volume is within normal limits for age. No midline shift, ventriculomegaly, mass effect, evidence of mass lesion, intracranial hemorrhage or evidence of cortically based acute infarction. Patchy and confluent bilateral cerebral white matter hypodensity. Deep gray nuclei appear spared. No cortical encephalomalacia identified. Vascular: No suspicious intracranial vascular hyperdensity. Skull: Negative. Partially visible cervical spine fusion changes on the scout view. Sinuses/Orbits: Visible paranasal sinuses are clear. Tympanic cavities also appear clear. Some chronic sclerotic changes are noted to the mastoids which are relatively well pneumatized. Other: Visualized orbits and scalp soft tissues are within normal limits. ASPECTS [facility] Stroke Program Early CT Score) Total score (0-10 with 10 being normal): 10 IMPRESSION: 1. No acute cortically based infarct or acute intracranial hemorrhage identified. ASPECTS 10. 2. Moderate for age cerebral white matter changes, most commonly due to chronic small vessel  disease. Electronically Signed: By: Odessa Fleming M.D. On: 10/23/2020 11:40    ____________________________________________   PROCEDURES  Procedure(s) performed (including Critical Care):  .1-3 Lead EKG Interpretation Performed by: Gilles Chiquito, MD Authorized by: Gilles Chiquito, MD     Interpretation: normal     ECG rate assessment: bradycardic     Rhythm: sinus rhythm     Ectopy: none     Conduction: normal   .Critical Care Performed by: Gilles Chiquito, MD Authorized by: Gilles Chiquito, MD   Critical care provider statement:    Critical care time (minutes):  45   Critical care time was exclusive of:  Separately billable procedures and treating other patients   Critical care was necessary to treat or prevent imminent  or life-threatening deterioration of the following conditions:  CNS failure or compromise   Critical care was time spent personally by me on the following activities:  Discussions with consultants, evaluation of patient's response to treatment, examination of patient, ordering and performing treatments and interventions, ordering and review of laboratory studies, ordering and review of radiographic studies, pulse oximetry, re-evaluation of patient's condition, obtaining history from patient or surrogate and review of old charts     ____________________________________________   INITIAL IMPRESSION / ASSESSMENT AND PLAN / ED COURSE      Patient presents with left to history exam for assessment of cute onset of blurry vision and difficulty ambulating.  He is noted to be ataxic in the emergency room does have nystagmus on exam.  On arrival he is slightly hypertensive with a BP of 176/90 and slightly bradycardic with a heart rate of 53 otherwise stable vital signs on room air.  Patient was made a code stroke on arrival.  No evidence of intracranial hemorrhage or large vessel occlusion on initial imaging ordered by neurology consultant.  Over due to history and exam there is still concern for possible vertebrobasilar stroke.  No history or exam findings to suggest acute traumatic injury.  No historical or exam findings to this acute infectious process.  With regard to patient's left shoulder soreness no evidence of septic joint or cellulitis on exam.  Chest x-ray shows no evidence of pneumonia, thorax or fracture dislocation or other abnormal process in the left upper chest or shoulder.  Unclear etiology of time although suspect MSK.  CBC is unremarkable.  INR is unremarkable.  COVID is negative.  CMP shows K of 3.3 with no other significant electrolyte or metabolic derangements.  TSH is unremarkable and troponin is 10.  Low suspicion for ACS or myocarditis at this time.  Did discuss with patient  importance of tobacco cessation.  Patient will be admitted for further evaluation management of concern for CVA.   ____________________________________________   FINAL CLINICAL IMPRESSION(S) / ED DIAGNOSES  Final diagnoses:  Ataxia  Hypokalemia  Tobacco abuse  Nystagmus  Blurry vision    Medications  potassium chloride SA (KLOR-CON) CR tablet 40 mEq (has no administration in time range)  sodium chloride flush (NS) 0.9 % injection 3 mL (3 mLs Intravenous Given 10/23/20 1216)  iohexol (OMNIPAQUE) 350 MG/ML injection 75 mL (75 mLs Intravenous Contrast Given 10/23/20 1153)  aspirin EC tablet 650 mg (650 mg Oral Given 10/23/20 1215)     ED Discharge Orders    None       Note:  This document was prepared using Dragon voice recognition software and may include unintentional dictation errors.   Gilles Chiquito, MD 10/23/20 1321

## 2020-10-23 NOTE — ED Notes (Signed)
Phone given to pt, MRI on phone

## 2020-10-23 NOTE — ED Notes (Signed)
Pt was given meal tray and fluids. Pt states he is comfortable. Pt also had an output of 732mls of urine

## 2020-10-23 NOTE — Code Documentation (Signed)
Code stroke activated from triage, per pt at 0900 pt was in the bathroom when he had sudden onset for double vision and feeling dizzy and off balance, per Sam RN as pt walked into the ER pt almost fell and was leaning to the left and very off balance, pt taken to CT, Dr. Cheral Marker arrived in CT to assess pt, nystagmus noted, pt offered tPA and pt refused, CTA preformed, PO ASA bolus ordered, report off to [employer]

## 2020-10-23 NOTE — Evaluation (Signed)
Physical Therapy Evaluation Patient Details Name: Timothy Matthews MRN: [id number] DOB: 1946-08-28 Today's Date: 10/23/2020   History of Present Illness  Per MD notes: Pt is a 75 y.o. male with medical history significant of HTN, Hep C, cervical spine Sx, prostate cancer, emphysema, cocaine abuse, PTSD who presents to the ED with acute onset of vertigo, gait unsteadiness and double vision.  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    Clinical Impression  Pt was pleasant and motivated to participate during the session.  Pt's only complaint at onset of session was "double vision".  Pt presented with good strength to his BLE's and BUE's that was equal left/right.  No noted deficits in light touch or proprioception noted other than decreased sensation to light touch in the RUE that is chronic per patient since his cervical spine surgery.  No noted deficits in gross or fine motor coordination noted.  Once in sitting, however, pt did present with min to mod left lateral lean that improved only slightly with cuing and with right lateral leaning activities.  Pt also presented with left lateral lean in standing and required min A on several occasions to prevent LOB to the left during static standing balance training and also during ambulation.  Pt is at a very high risk for falls and would not be safe to return to his prior living situation at this time.  Pt will benefit from PT services in a SNF setting upon discharge to safely address deficits listed in patient problem list for decreased caregiver assistance and eventual return to PLOF.      Follow Up Recommendations SNF;Supervision for mobility/OOB    Equipment Recommendations  Rolling walker with 5" wheels    Recommendations for Other Services       Precautions / Restrictions Precautions Precautions: Fall Restrictions Weight Bearing Restrictions: No      Mobility  Bed  Mobility Overal bed mobility: Independent             General bed mobility comments: Good speed and effort with bed mobility tasks    Transfers Overall transfer level: Needs assistance Equipment used: Rolling walker (2 wheeled) Transfers: Sit to/from Stand Sit to Stand: Min assist         General transfer comment: Min A for stability at times upon coming to full standing secondary to left lateral instability  Ambulation/Gait Ambulation/Gait assistance: Min assist Gait Distance (Feet): 30 Feet Assistive device: Rolling walker (2 wheeled) Gait Pattern/deviations: Step-through pattern;Decreased step length - right;Decreased step length - left Gait velocity: decreased   General Gait Details: Min A for stability at times during ambulation secondary to left lateral instability  Stairs            Wheelchair Mobility    Modified Rankin (Stroke Patients Only)       Balance Overall balance assessment: Needs assistance Sitting-balance support: Feet supported;No upper extremity supported Sitting balance-Leahy Scale: Fair Sitting balance - Comments: Pt with min to mod left lateral lean but able to maintain sitting balance despite weight shift Postural control: Left lateral lean Standing balance support: Bilateral upper extremity supported;During functional activity Standing balance-Leahy Scale: Poor Standing balance comment: Min A for stability at times secondary to left lateral instability                             Pertinent Vitals/Pain Pain Assessment: No/denies pain  Home Living Family/patient expects to be discharged to:: Private residence Living Arrangements: Alone Available Help at Discharge: Family;Available PRN/intermittently Type of Home: Other(Comment) [facility] room) Home Access: Stairs to enter Entrance Stairs-Rails: Right;Left (Too wide for both) Entrance Stairs-Number of Steps: 16 Home Layout: One level Home Equipment: None       Prior Function Level of Independence: Independent         Comments: Ind amb community distances without an AD, 2 falls in the last 6 months from tripping, Ind with ADLs     Hand Dominance   Dominant Hand: Right    Extremity/Trunk Assessment   Upper Extremity Assessment Upper Extremity Assessment: RUE deficits/detail;LUE deficits/detail RUE Deficits / Details: RUE strength grossly 4+/5; min decreased sensation to light touch chronic per patient from cervical spine surgery RUE Sensation: decreased light touch RUE Coordination: WNL LUE Deficits / Details: LUE strength grossly 4+/5 LUE Sensation: WNL LUE Coordination: WNL    Lower Extremity Assessment Lower Extremity Assessment: RLE deficits/detail;LLE deficits/detail RLE Deficits / Details: RLE strength 5/5 and equal left/right RLE Sensation: WNL RLE Coordination: WNL LLE Deficits / Details: LLE strength 5/5 and equal left/right LLE Sensation: WNL LLE Coordination: WNL       Communication   Communication: No difficulties  Cognition Arousal/Alertness: Awake/alert Behavior During Therapy: WFL for tasks assessed/performed Overall Cognitive Status: Within Functional Limits for tasks assessed                                        General Comments      Exercises Other Exercises Other Exercises: Right lateral weight shifting in both sitting and standing to address left lateral lean/instability Other Exercises: Static standing balance training without UE support   Assessment/Plan    PT Assessment Patient needs continued PT services  PT Problem List Decreased balance;Decreased knowledge of use of DME       PT Treatment Interventions DME instruction;Gait training;Neuromuscular re-education;Stair training;Functional mobility training;Therapeutic activities;Therapeutic exercise;Balance training;Patient/family education    PT Goals (Current goals can be found in the Care Plan section)  Acute Rehab PT  Goals Patient Stated Goal: To return home PT Goal Formulation: With patient Time For Goal Achievement: 11/05/20 Potential to Achieve Goals: Good    Frequency 7X/week   Barriers to discharge Inaccessible home environment;Decreased caregiver support      Co-evaluation               AM-PAC PT "6 Clicks" Mobility  Outcome Measure Help needed turning from your back to your side while in a flat bed without using bedrails?: None Help needed moving from lying on your back to sitting on the side of a flat bed without using bedrails?: None Help needed moving to and from a bed to a chair (including a wheelchair)?: A Little Help needed standing up from a chair using your arms (e.g., wheelchair or bedside chair)?: A Little Help needed to walk in hospital room?: A Little Help needed climbing 3-5 steps with a railing? : A Lot 6 Click Score: 19    End of Session Equipment Utilized During Treatment: Gait belt Activity Tolerance: Patient tolerated treatment well Patient left: in bed;with family/visitor present;with call bell/phone within reach;Other (comment) (One bed rail down per patient request) Nurse Communication: Mobility status;Other (comment) (One bed rail down per patient request.) PT Visit Diagnosis: Unsteadiness on feet (R26.81);History of falling (Z91.81);Difficulty in walking, not elsewhere classified (R26.2)  Time: 6503-5465 PT Time Calculation (min) (ACUTE ONLY): 28 min   Charges:   PT Evaluation $PT Eval Moderate Complexity: 1 Mod PT Treatments $Therapeutic Activity: 8-22 mins        D. Royetta Asal PT, DPT 10/23/20, 5:42 PM

## 2020-10-23 NOTE — Consult Note (Signed)
Referring Physician: Dr. Tamala Julian    Chief Complaint: Acute onset of vertigo, gait unsteadiness and double vision  HPI: Timothy Matthews is an 75 y.o. male with a PMHx of HTN and cancer who presents to the ED with acute onset of vertigo, gait unsteadiness and double vision. Symptoms began at 9 AM while in the shower, for which he called his wife. After she saw his condition, they decided to go to the ED. While walking into the ED waiting room, the patient was noted to be very unsteady and almost fell. RN caught the patient and assisted him to a wheelchair. A Code Stroke was then called.   The patient states that his double vision goes away with either eye closed.   LSN: [id number] tPA Given: No: The patient refused tPA after full discussion of risks/benefits.   Past Medical History:  Diagnosis Date  . Cancer ([place])   . Hypertension     No past surgical history on file.  No family history on file. Social History:  reports that he has been smoking cigarettes. He has never used smokeless tobacco. He reports previous alcohol use. He reports current drug use. Drugs: Cocaine and Marijuana.  Allergies: No Known Allergies  Home Medications: No current facility-administered medications on file prior to encounter.   Current Outpatient Medications on File Prior to Encounter  Medication Sig Dispense Refill  . albuterol (VENTOLIN HFA) 108 (90 Base) MCG/ACT inhaler Inhale 2 puffs into the lungs every 6 (six) hours as needed for wheezing or shortness of breath. 8 g 1  . azithromycin (ZITHROMAX) 250 MG tablet Take 1 a day for 4 days 4 each 0  . oxyCODONE-acetaminophen (ROXICET) 5-325 MG tablet Take 1 tablet by mouth every 4 (four) hours as needed for severe pain. 20 tablet 0    ROS: Denies limb weakness, facial droop, visual field cut, limb numbness, trouble talking or aphasia. Other ROS as per HPI.   Physical Examination: Blood pressure (!) 176/90, pulse (!) 53, temperature 98 F (36.7 C), resp. rate  18, SpO2 100 %.  HEENT: Hooks/AT Lungs: Respirations unlabored Ext: No edema  Neurologic Examination: Ment: Alert and fully oriented. Speech nondysarthric and fluent with intact comprehension and naming.  CN: Visual fields intact. PERRL. Binocular visual acuity with spectacles is 20/40. Has binocular double vision that resolves with either eye closed. Mild intermittent esotropia is noted. Right eye lagging left when gazing to the left, in conjunction with horizontal nystagmus of left eye only during far leftward gaze. Also with vertical nystagmus bilaterally on upgaze. Facial temp sensation equal bilaterally. VII- intact bilaterally. Hearing intact to voice. Phonation intact. Tongue protrudes midline.  Motor: 5/5 bilateral upper and lower extremities proximally and distatlly Sensory: Intact to FT x 4 without extinction. Has right lower leg temperature deficit that is chronic.  Reflexes: Some asymmetry noted.  Cerebellar: No ataxia with FNF, RAM or H-S bilaterally Gait: Unsteady gait which is wide based with listing to the left.   Results for orders placed or performed during the hospital encounter of 10/23/20 (from the past 48 hour(s))  CBG monitoring, ED     Status: None   Collection Time: 10/23/20 11:25 AM  Result Value Ref Range   Glucose-Capillary 83 70 - 99 mg/dL    Comment: Glucose reference range applies only to samples taken after fasting for at least 8 hours.  Protime-INR     Status: None   Collection Time: 10/23/20 12:10 PM  Result Value Ref Range   Prothrombin  Time 13.6 11.4 - 15.2 seconds   INR 1.1 0.8 - 1.2    Comment: (NOTE) INR goal varies based on device and disease states. Performed at [facility], [address] [place], [place] [address]   APTT     Status: None   Collection Time: 10/23/20 12:10 PM  Result Value Ref Range   aPTT 33 24 - 36 seconds    Comment: Performed at [facility], [address] [place], [place] [address]  CBC     Status:  None   Collection Time: 10/23/20 12:10 PM  Result Value Ref Range   WBC 6.3 4.0 - 10.5 K/uL   RBC 4.96 4.22 - 5.81 MIL/uL   Hemoglobin 13.6 13.0 - 17.0 g/dL   HCT 42.8 39.0 - 52.0 %   MCV 86.3 80.0 - 100.0 fL   MCH 27.4 26.0 - 34.0 pg   MCHC 31.8 30.0 - 36.0 g/dL   RDW 14.6 11.5 - 15.5 %   Platelets 217 150 - 400 K/uL   nRBC 0.0 0.0 - 0.2 %    Comment: Performed at [facility], [address] [place], [place] [address]  Differential     Status: None   Collection Time: 10/23/20 12:10 PM  Result Value Ref Range   Neutrophils Relative % 53 %   Neutro Abs 3.3 1.7 - 7.7 K/uL   Lymphocytes Relative 34 %   Lymphs Abs 2.1 0.7 - 4.0 K/uL   Monocytes Relative 9 %   Monocytes Absolute 0.6 0.1 - 1.0 K/uL   Eosinophils Relative 3 %   Eosinophils Absolute 0.2 0.0 - 0.5 K/uL   Basophils Relative 1 %   Basophils Absolute 0.0 0.0 - 0.1 K/uL   Immature Granulocytes 0 %   Abs Immature Granulocytes 0.02 0.00 - 0.07 K/uL    Comment: Performed at [facility], [address], [place], [place] [address]   CT ANGIO HEAD W OR WO CONTRAST  Result Date: 10/23/2020 CLINICAL DATA:  Dizziness.  Blurred vision.  Unsteady gait. EXAM: CT ANGIOGRAPHY HEAD AND NECK TECHNIQUE: Multidetector CT imaging of the head and neck was performed using the standard protocol during bolus administration of intravenous contrast. Multiplanar CT image reconstructions and MIPs were obtained to evaluate the vascular anatomy. Carotid stenosis measurements (when applicable) are obtained utilizing NASCET criteria, using the distal internal carotid diameter as the denominator. CONTRAST:  52mL OMNIPAQUE IOHEXOL 350 MG/ML SOLN COMPARISON:  CT head without contrast 10/23/2020 FINDINGS: CTA NECK FINDINGS Aortic arch: Minimal atherosclerotic changes are present within the aortic arch. Three vessel arch configuration is present. No aneurysm stenosis is present. Right carotid system: Right common carotid artery is mildly  tortuous without significant stenosis. Bifurcation is unremarkable. Cervical right ICA is normal. Left carotid system: The left common carotid artery within normal limits. Bifurcation is unremarkable. Cervical left ICA is within limits. Vertebral arteries: The right vertebral artery is the dominant vessel. Both vertebral arteries originate from the subclavian arteries without significant stenosis. Is no significant stenosis in either vertebral artery in the neck. Skeleton: Anterior fusion is noted C3-7. Posterior hardware extends to the T2 level. Posterior fusion is present through at least T1. There is probable posterior fusion at T1-2. Hardware is intact. No focal lytic or blastic lesions are present. Other neck: Soft tissues the neck are unremarkable. Upper chest: Paraseptal emphysematous changes are noted at the lung apices. Centrilobular emphysematous changes are present as well. No focal nodule or mass lesion is present. The  thoracic inlet is within normal limits. Review of the MIP images confirms the above findings CTA HEAD FINDINGS Anterior circulation: The internal carotid arteries are within limits the skull base through the ICA termini. The A1 segments are. The anterior communicating artery is not definitively seen. MCA bifurcations are intact. Is some segmental irregularity without a discrete proximal stenosis branch vessel occlusion. Posterior circulation: The right vertebral artery is dominant. PICA origins are visualized and within normal limits. The vertebrobasilar junction is normal. Basilar artery is normal. Both posterior cerebral arteries originate basilar tip. Right posterior communicating artery contributes. PCA branch vessels are within normal. Venous sinuses: The dural sinuses are patent. Straight sinus deep cerebral veins are intact. Cortical veins are within normal limits. No vascular malformation present. Anatomic variants: None Review of the MIP images confirms the above findings  IMPRESSION: 1. No emergent large vessel occlusion. 2. Mild segmental irregularity of the circle-of-Willis without a discrete proximal stenosis, aneurysm, or branch vessel occlusion. 3. Minimal atherosclerotic changes at the aortic arch without significant stenosis. 4. Mild tortuosity of the cervical vasculature without significant stenosis in the neck. 5. Postoperative changes of the cervical spine as described. 6. Emphysema (ICD10-J43.9). Electronically Signed   By: San Morelle M.D.   On: 10/23/2020 12:37   DG Chest 2 View  Result Date: 10/23/2020 CLINICAL DATA:  Left shoulder pain Dizziness Blurry vision EXAM: CHEST - 2 VIEW COMPARISON:  10/23/2020 FINDINGS: Cardiomediastinal silhouette and pulmonary vasculature are within normal limits. Cervical fusion hardware partially visualized. Increased interstitial opacities seen throughout both lungs, unchanged from prior examination suggestive of chronic interstitial pneumonitis. IMPRESSION: No acute cardiopulmonary process. Unchanged increased interstitial opacity seen throughout both lungs. Electronically Signed   By: Miachel Roux M.D.   On: 10/23/2020 12:44   CT ANGIO NECK W OR WO CONTRAST  Result Date: 10/23/2020 CLINICAL DATA:  Dizziness.  Blurred vision.  Unsteady gait. EXAM: CT ANGIOGRAPHY HEAD AND NECK TECHNIQUE: Multidetector CT imaging of the head and neck was performed using the standard protocol during bolus administration of intravenous contrast. Multiplanar CT image reconstructions and MIPs were obtained to evaluate the vascular anatomy. Carotid stenosis measurements (when applicable) are obtained utilizing NASCET criteria, using the distal internal carotid diameter as the denominator. CONTRAST:  76mL OMNIPAQUE IOHEXOL 350 MG/ML SOLN COMPARISON:  CT head without contrast 10/23/2020 FINDINGS: CTA NECK FINDINGS Aortic arch: Minimal atherosclerotic changes are present within the aortic arch. Three vessel arch configuration is present. No  aneurysm stenosis is present. Right carotid system: Right common carotid artery is mildly tortuous without significant stenosis. Bifurcation is unremarkable. Cervical right ICA is normal. Left carotid system: The left common carotid artery within normal limits. Bifurcation is unremarkable. Cervical left ICA is within limits. Vertebral arteries: The right vertebral artery is the dominant vessel. Both vertebral arteries originate from the subclavian arteries without significant stenosis. Is no significant stenosis in either vertebral artery in the neck. Skeleton: Anterior fusion is noted C3-7. Posterior hardware extends to the T2 level. Posterior fusion is present through at least T1. There is probable posterior fusion at T1-2. Hardware is intact. No focal lytic or blastic lesions are present. Other neck: Soft tissues the neck are unremarkable. Upper chest: Paraseptal emphysematous changes are noted at the lung apices. Centrilobular emphysematous changes are present as well. No focal nodule or mass lesion is present. The thoracic inlet is within normal limits. Review of the MIP images confirms the above findings CTA HEAD FINDINGS Anterior circulation: The internal carotid arteries are within limits the  skull base through the ICA termini. The A1 segments are. The anterior communicating artery is not definitively seen. MCA bifurcations are intact. Is some segmental irregularity without a discrete proximal stenosis branch vessel occlusion. Posterior circulation: The right vertebral artery is dominant. PICA origins are visualized and within normal limits. The vertebrobasilar junction is normal. Basilar artery is normal. Both posterior cerebral arteries originate basilar tip. Right posterior communicating artery contributes. PCA branch vessels are within normal. Venous sinuses: The dural sinuses are patent. Straight sinus deep cerebral veins are intact. Cortical veins are within normal limits. No vascular malformation  present. Anatomic variants: None Review of the MIP images confirms the above findings IMPRESSION: 1. No emergent large vessel occlusion. 2. Mild segmental irregularity of the circle-of-Willis without a discrete proximal stenosis, aneurysm, or branch vessel occlusion. 3. Minimal atherosclerotic changes at the aortic arch without significant stenosis. 4. Mild tortuosity of the cervical vasculature without significant stenosis in the neck. 5. Postoperative changes of the cervical spine as described. 6. Emphysema (ICD10-J43.9). Electronically Signed   By: San Morelle M.D.   On: 10/23/2020 12:37   CT HEAD CODE STROKE WO CONTRAST  Addendum Date: 10/23/2020   ADDENDUM REPORT: 10/23/2020 11:47 ADDENDUM: Study discussed by telephone with Dr. Cherylann Banas in the ED on 10/23/2020 at 1144 hours. Electronically Signed   By: Genevie Ann M.D.   On: 10/23/2020 11:47   Result Date: 10/23/2020 CLINICAL DATA:  Code stroke. 75 year old male with blurred vision and dizziness. Hypertensive. EXAM: CT HEAD WITHOUT CONTRAST TECHNIQUE: Contiguous axial images were obtained from the base of the skull through the vertex without intravenous contrast. COMPARISON:  None. FINDINGS: Brain: Cerebral volume is within normal limits for age. No midline shift, ventriculomegaly, mass effect, evidence of mass lesion, intracranial hemorrhage or evidence of cortically based acute infarction. Patchy and confluent bilateral cerebral white matter hypodensity. Deep gray nuclei appear spared. No cortical encephalomalacia identified. Vascular: No suspicious intracranial vascular hyperdensity. Skull: Negative. Partially visible cervical spine fusion changes on the scout view. Sinuses/Orbits: Visible paranasal sinuses are clear. Tympanic cavities also appear clear. Some chronic sclerotic changes are noted to the mastoids which are relatively well pneumatized. Other: Visualized orbits and scalp soft tissues are within normal limits. ASPECTS [facility] Stroke  Program Early CT Score) Total score (0-10 with 10 being normal): 10 IMPRESSION: 1. No acute cortically based infarct or acute intracranial hemorrhage identified. ASPECTS 10. 2. 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 Electronically Signed: By: Genevie Ann M.D. On: 10/23/2020 11:40   CT head:  1. No acute cortically based infarct or acute intracranial hemorrhage identified. ASPECTS 10. 2. 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  CTA of head and neck: 1. No emergent large vessel occlusion. 2. Mild segmental irregularity of the circle-of-Willis without a discrete proximal stenosis, aneurysm, or branch vessel occlusion. 3. Minimal atherosclerotic changes at the aortic arch without significant stenosis. 4. Mild tortuosity of the cervical vasculature without significant stenosis in the neck. 5. Postoperative changes of the cervical spine   Assessment: 75 y.o. male presenting with acute onset of vertigo, gait unsteadiness and double vision 1. Exam reveals right eye lagging left when gazing to the left, in conjunction with horizontal nystagmus of left eye only during far leftward gaze. Also with vertical nystagmus bilaterally on upgaze. 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 An alternate localization  would be a left cerebellar lesion as he lists to the left when attempting to walk; gait is also wide based and unsteady. However, no appendicular ataxia was noted.  2. CT head without acute hemorrhage.  3. CTA without LVO 4. The patient is a tPA candidate. After full review of risks/benefits, the patient elected not to proceed with tPA due to what he felt was an unacceptable risk of ICH.  5. Stroke Risk Factors - HTN and cancer history   Recommendations: 1. HgbA1c, fasting lipid panel 2. MRI of the brain  with and without contrast to assess for stroke versus peripheral vestibulopathy 3. PT consult, OT consult, Speech consult 4. TTE 5. Cardiac telemetry 6. Prophylactic therapy- Start ASA 81 mg po qd. Administer 650 mg crushed ASA x 1 now.  7. Risk factor modification 8. Frequent neuro checks 9. Modified permissive HTN protocol given the patient's age. Treat if SBP > 180. After 24 hours, reduce BP gradually by 15% per day to SBP goal of 120-140.     @Electronically  signed: Dr. Kerney Elbe  10/23/2020, 12:51 PM

## 2020-10-23 NOTE — ED Notes (Signed)
Wife called and will return shortly

## 2020-10-23 NOTE — ED Notes (Signed)
Patient transported to MRI 

## 2020-10-23 NOTE — ED Notes (Signed)
Blanket given to pt. No further needs expressed by pt or wife.

## 2020-10-23 NOTE — Progress Notes (Signed)
CODE STROKE- PHARMACY COMMUNICATION   Time CODE STROKE called/page received: 1130  Time response to CODE STROKE was made: 1135  Time Stroke Kit retrieved from Pyxis: N/A, patient refused tPA   Benita Gutter  10/23/2020  12:11 PM

## 2020-10-23 NOTE — ED Notes (Addendum)
Pt in Room Brought from CT by Mitzi Davenport. / Was advised that Minette Brine RN would chart NIH Scale   Advised pt refused TPA

## 2020-10-23 NOTE — ED Notes (Signed)
Chaplain at bedside

## 2020-10-23 NOTE — ED Notes (Signed)
PT at bedside.

## 2020-10-24 ENCOUNTER — Observation Stay (HOSPITAL_BASED_OUTPATIENT_CLINIC_OR_DEPARTMENT_OTHER)
Admit: 2020-10-24 | Discharge: 2020-10-24 | Disposition: A | Discharge status: Home / Self Care | Payer: Medicare Other | Attending: Internal Medicine | Admitting: Internal Medicine

## 2020-10-24 DIAGNOSIS — I6389 Other cerebral infarction: Secondary | ICD-10-CM | POA: Diagnosis not present

## 2020-10-24 DIAGNOSIS — I10 Essential (primary) hypertension: Secondary | ICD-10-CM | POA: Diagnosis not present

## 2020-10-24 LAB — ECHOCARDIOGRAM COMPLETE
AR max vel: 2.71 cm2
AV Area VTI: 2.94 cm2
AV Area mean vel: 2.92 cm2
AV Mean grad: 5 mmHg
AV Peak grad: 10.6 mmHg
Ao pk vel: 1.63 m/s
Area-P 1/2: 2.63 cm2
MV VTI: 2.34 cm2
S' Lateral: 2.6 cm

## 2020-10-24 LAB — HEMOGLOBIN A1C
Hgb A1c MFr Bld: 5.6 % (ref 4.8–5.6)
Mean Plasma Glucose: 114.02 mg/dL

## 2020-10-24 LAB — LIPID PANEL
Cholesterol: 135 mg/dL (ref 0–200)
HDL: 49 mg/dL (ref >40)
LDL Cholesterol: 64 mg/dL (ref 0–99)
Total CHOL/HDL Ratio: 2.8 RATIO
Triglycerides: 109 mg/dL (ref <150)
VLDL: 22 mg/dL (ref 0–40)

## 2020-10-24 NOTE — Progress Notes (Signed)
MRI brain came back negative for acute infarct. Mild chronic white matter changes bilaterally are noted.  TTE has been performed with results pending.   Electronically signed: Dr. Kerney Elbe

## 2020-10-24 NOTE — Evaluation (Signed)
Occupational Therapy Evaluation Patient Details Name: Timothy Matthews MRN: [id number] DOB: 05-30-1946 Today's Date: 10/24/2020    History of Present Illness Per MD notes: Pt is a 75 y.o. male with medical history significant of HTN, Hep C, cervical spine Sx, prostate cancer, emphysema, cocaine abuse, PTSD who presents to the ED with acute onset of vertigo, gait unsteadiness and double vision.  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   Clinical Impression   Patient presenting with decreased I in self care, balance, functional mobility/transfers, endurance, and safety awareness.  Patient reports living in a motel independently PTA. 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 Patient currently functioning at set up A - min A for self care tasks and functional mobility. Pt with noted L lateral lean in sitting and standing and corrects with when given cuing to do so but is otherwise unaware. Pt reports diplopia with double vision side by side. Noted beating nystagmus when looking to far L. OT occluded L eye of glasses to decrease double vision for pt to watch TV and eat meal with less difficulty. OT verbalized this was just an intervention to compensate but would likely need further follow up to address visoin.   Patient will benefit from acute OT to increase overall independence in the areas of ADLs, functional mobility, and safety awareness in order to safely discharge to next venue of care.    Follow Up Recommendations  SNF;Supervision - Intermittent    Equipment Recommendations  Other (comment) (defer to next venue of care)       Precautions / Restrictions Precautions Precautions: Fall Restrictions Weight Bearing Restrictions: No      Mobility Bed Mobility Overal bed mobility: Independent             General bed mobility comments: seated on EOB at end of session.     Transfers Overall transfer level: Needs assistance Equipment used: Rolling walker (2 wheeled) Transfers: Sit to/from Stand Sit to Stand: Min assist         General transfer comment: Min A for stability at times upon coming to full standing secondary to left lateral instability    Balance Overall balance assessment: Needs assistance Sitting-balance support: Feet supported;No upper extremity supported Sitting balance-Leahy Scale: Fair Sitting balance - Comments: Pt with min left lateral lean but able to maintain sitting balance despite weight shift Postural control: Left lateral lean Standing balance support: Bilateral upper extremity supported;During functional activity Standing balance-Leahy Scale: Poor Standing balance comment: Min A for stability at times secondary to left lateral instability                           ADL either performed or assessed with clinical judgement   ADL Overall ADL's : Needs assistance/impaired Eating/Feeding: Independent;Sitting   Grooming: Wash/dry hands;Wash/dry face;Oral care;Set up;Sitting               Lower Body Dressing: Minimal assistance;Sit to/from stand                       Vision Baseline Vision/History: Wears glasses Wears Glasses: Reading only Patient Visual Report: Diplopia Vision Assessment?: Yes Eye Alignment: Within Functional Limits Tracking/Visual Pursuits: Decreased smoothness of eye movement to LEFT inferior field Saccades: Impaired - to be further tested in functional context;Decreased speed of saccadic movement (beating nystagmus noted with far L  gaze) Convergence: Within functional limits Diplopia Assessment: Disappears with one eye closed;Objects split side to side Additional Comments: OT occluded L eye of reading glasses for pt to be able to eat food and watch TV without feeling nauseated from diplopia            Pertinent Vitals/Pain Pain Assessment: No/denies pain     Hand  Dominance Right   Extremity/Trunk Assessment Upper Extremity Assessment Upper Extremity Assessment: RUE deficits/detail RUE Deficits / Details: RUE strength grossly 4+/5; min decreased sensation to light touch chronic per patient from cervical spine surgery. Pt demonstrated using functionally to feed self RUE Sensation: decreased light touch RUE Coordination: WNL LUE Deficits / Details: LUE strength grossly 4+/5 LUE Sensation: WNL LUE Coordination: WNL   Lower Extremity Assessment Lower Extremity Assessment: Defer to PT evaluation       Communication Communication Communication: No difficulties   Cognition Arousal/Alertness: Awake/alert Behavior During Therapy: WFL for tasks assessed/performed Overall Cognitive Status: Within Functional Limits for tasks assessed                                        Exercises Total Joint Exercises Ankle Circles/Pumps: Strengthening;Both;5 reps;10 reps (with manual resistance) Quad Sets: Strengthening;Both;10 reps Gluteal Sets: Strengthening;Both;10 reps Hip ABduction/ADduction: Strengthening;Both;10 reps (with manual resistance) Long Arc Quad: Strengthening;Both;10 reps (with manual resistance) Knee Flexion: Strengthening;Both;10 reps (with manual resistance) Other Exercises Other Exercises: HEP education/review for BLE APs, QS, and GS x 10 each every 1-2 hours daily   Shoulder Instructions      Home Living Family/patient expects to be discharged to:: Private residence Living Arrangements: Alone Available Help at Discharge: Family;Available PRN/intermittently Type of Home: Other(Comment) (hotel room) Home Access: Stairs to enter Entrance Stairs-Number of Steps: 16 Entrance Stairs-Rails: Right;Left Home Layout: One level     Bathroom Shower/Tub: Teacher, early years/pre: Standard     Home Equipment: None          Prior Functioning/Environment Level of Independence: Independent        Comments:  Ind amb community distances without an AD, 2 falls in the last 6 months from tripping, Ind with ADLs        OT Problem List: Decreased strength;Impaired vision/perception;Decreased knowledge of use of DME or AE;Decreased activity tolerance;Cardiopulmonary status limiting activity;Impaired balance (sitting and/or standing);Decreased safety awareness      OT Treatment/Interventions: Self-care/ADL training;Therapeutic exercise;Energy conservation;Therapeutic activities;Cognitive remediation/compensation;DME and/or AE instruction;Balance training;Patient/family education;Visual/perceptual remediation/compensation    OT Goals(Current goals can be found in the care plan section) Acute Rehab OT Goals Patient Stated Goal: To return home OT Goal Formulation: With patient Time For Goal Achievement: 11/07/20 Potential to Achieve Goals: Good  OT Frequency: Min 2X/week   Barriers to D/C: Decreased caregiver support             AM-PAC OT "6 Clicks" Daily Activity     Outcome Measure Help from another person eating meals?: None Help from another person taking care of personal grooming?: A Little Help from another person toileting, which includes using toliet, bedpan, or urinal?: A Little Help from another person bathing (including washing, rinsing, drying)?: A Little Help from another person to put on and taking off regular upper body clothing?: None Help from another person to put on and taking off regular lower body clothing?: A Little 6 Click Score: 20   End of Session Equipment Utilized During Treatment: Rolling  walker Nurse Communication: Mobility status  Activity Tolerance: Patient tolerated treatment well Patient left: in bed;with call bell/phone within reach  OT Visit Diagnosis: Unsteadiness on feet (R26.81);Muscle weakness (generalized) (M62.81)                Time: 0940-1006 OT Time Calculation (min): 26 min Charges:  OT General Charges $OT Visit: 1 Visit OT Evaluation $OT  Eval Low Complexity: 1 Low OT Treatments $Self Care/Home Management : 8-22 mins  Darleen Crocker, MS, OTR/L , CBIS ascom [phone]  10/24/20, 1:56 PM

## 2020-10-24 NOTE — Progress Notes (Signed)
PROGRESS NOTE    Timothy Matthews  [id number] DOB: 20-Dec-1945 DOA: 10/23/2020 PCP: Will Bonnet, MD   Brief Narrative:  This 75 years old male with PMH significant for hypertension, prostate cancer, emphysema, cocaine abuse, PTSD who presented to the ED with acute onset of vertigo, unsteadiness and double vision.  Patient was admitted to rule out a stroke.  CT head without acute finding.  CTA head and neck without LVO,  mild segmental irregularity.  Patient was evaluated by neurology offered tPA but patient declined.  MRI head without any acute abnormalities.  Assessment & Plan:   Active Problems:   CVA (cerebral vascular accident) ([place])   Cocaine abuse ([place])   HTN (hypertension)   Emphysema lung ([facility])   GERD (gastroesophageal reflux disease)   Vertigo, unsteadiness and double vision could be secondary to CVA -  Patient was admitted for stroke work-up.  Neurology consulted. most likely brainstem event vs cerebellar event. Pt offered and declined TPA. Continue telemetry PT/OT and speech evaluation. MRI no acute abnormalities noted TTE: LVEF 60 to 65%, no regional wall motion abnormality Continue aspirin and statin Smoking cessation, cocaine cessation.  HTN - permissive hypertension during first 24 hours. Will resume home meds - list from [place] currently pending  Emphysema - established problem Continue Smoking cessation Continue albuterol MDI prn  Cocaine abuse - long standing problem Social worker consult For agitation/restlessness Ativan q4  GERD - long standing problem. NO report of esophagitis. Continue PPI tx    DVT prophylaxis: Lovenox Code Status: Full code Family Communication: No one at bedside  Disposition Plan:   Status is: Observation  The patient remains OBS appropriate and will d/c before 2 midnights.  Dispo: The patient is from: Home              Anticipated d/c is to: SNF              Anticipated d/c date is: > 3 days               Patient currently is not medically stable to d/c.   Consultants:   NEUROLOGY  Procedures: MRI head, echocardiogram Antimicrobials:  Anti-infectives (From admission, onward)   None      Subjective: Patient was seen and examined at bedside.  Overnight events noted.  Patient reports feeling much better.  He still reports double vision but improving.  Objective: Vitals:   10/24/20 1215 10/24/20 1255 10/24/20 1355 10/24/20 1530  BP:  (!) 148/93 (!) 150/85 (!) 155/81  Pulse: 60 85 61 67  Resp: (!) 26 17 18 16   Temp:  98.1 F (36.7 C) 97.8 F (36.6 C) 98.4 F (36.9 C)  TempSrc:  Oral Oral Oral  SpO2: 95% 96% 98% 97%    Intake/Output Summary (Last 24 hours) at 10/24/2020 1735 Last data filed at 10/24/2020 0947 Gross per 24 hour  Intake --  Output 500 ml  Net -500 ml   There were no vitals filed for this visit.  Examination:  General exam: Appears calm and comfortable , not in any acute distress Respiratory system: Clear to auscultation. Respiratory effort normal. Cardiovascular system: S1 & S2 heard, RRR. No JVD, murmurs, rubs, gallops or clicks. No pedal edema. Gastrointestinal system: Abdomen is nondistended, soft and nontender. No organomegaly or masses felt. Normal bowel sounds heard. Central nervous system: Alert and oriented. No focal neurological deficits. Extremities: Symmetric 5 x 5 power.  No edema, no clubbing, no cyanosis Skin: No rashes, lesions or ulcers Psychiatry:  Judgement and insight appear normal. Mood & affect appropriate.     Data Reviewed: I have personally reviewed following labs and imaging studies  CBC: Recent Labs  Lab 10/23/20 1210  WBC 6.3  NEUTROABS 3.3  HGB 13.6  HCT 42.8  MCV 86.3  PLT 403   Basic Metabolic Panel: Recent Labs  Lab 10/23/20 1210  NA 136  K 3.3*  CL 106  CO2 25  GLUCOSE 94  BUN 18  CREATININE 0.98  CALCIUM 7.8*   GFR: CrCl cannot be calculated (Unknown ideal weight.). Liver Function Tests: Recent  Labs  Lab 10/23/20 1210  AST 25  ALT 15  ALKPHOS 56  BILITOT 0.4  PROT 6.6  ALBUMIN 2.8*   No results for input(s): LIPASE, AMYLASE in the last 168 hours. No results for input(s): AMMONIA in the last 168 hours. Coagulation Profile: Recent Labs  Lab 10/23/20 1210  INR 1.1   Cardiac Enzymes: No results for input(s): CKTOTAL, CKMB, CKMBINDEX, TROPONINI in the last 168 hours. BNP (last 3 results) No results for input(s): PROBNP in the last 8760 hours. HbA1C: Recent Labs    10/24/20 0426  HGBA1C 5.6   CBG: Recent Labs  Lab 10/23/20 1125  GLUCAP 83   Lipid Profile: Recent Labs    10/24/20 0426  CHOL 135  HDL 49  LDLCALC 64  TRIG 109  CHOLHDL 2.8   Thyroid Function Tests: Recent Labs    10/23/20 1210  TSH 1.640   Anemia Panel: No results for input(s): VITAMINB12, FOLATE, FERRITIN, TIBC, IRON, RETICCTPCT in the last 72 hours. Sepsis Labs: No results for input(s): PROCALCITON, LATICACIDVEN in the last 168 hours.  Recent Results (from the past 240 hour(s))  Resp Panel by RT-PCR (Flu A&B, Covid) Nasopharyngeal Swab     Status: None   Collection Time: 10/23/20 12:10 PM   Specimen: Nasopharyngeal Swab; Nasopharyngeal(NP) swabs in vial transport medium  Result Value Ref Range Status   SARS Coronavirus 2 by RT PCR NEGATIVE NEGATIVE Final    Comment: (NOTE) SARS-CoV-2 target nucleic acids are NOT DETECTED.  The SARS-CoV-2 RNA is generally detectable in upper respiratory specimens during the acute phase of infection. The lowest concentration of SARS-CoV-2 viral copies this assay can detect is 138 copies/mL. A negative result does not preclude SARS-Cov-2 infection and should not be used as the sole basis for treatment or other patient management decisions. A negative result may occur with  improper specimen collection/handling, submission of specimen other than nasopharyngeal swab, presence of viral mutation(s) within the areas targeted by this assay, and  inadequate number of viral copies(<138 copies/mL). A negative result must be combined with clinical observations, patient history, and epidemiological information. The expected result is Negative.  Fact Sheet for Patients:  EntrepreneurPulse.com.au  Fact Sheet for Healthcare Providers:  IncredibleEmployment.be  This test is no t yet approved or cleared by the [place] FDA and  has been authorized for detection and/or diagnosis of SARS-CoV-2 by FDA under an Emergency Use Authorization (EUA). This EUA will remain  in effect (meaning this test can be used) for the duration of the COVID-19 declaration under Section 564(b)(1) of the Act, 21 U.S.C.section 360bbb-3(b)(1), unless the authorization is terminated  or revoked sooner.       Influenza A by PCR NEGATIVE NEGATIVE Final   Influenza B by PCR NEGATIVE NEGATIVE Final    Comment: (NOTE) The Xpert Xpress SARS-CoV-2/FLU/RSV plus assay is intended as an aid in the diagnosis of influenza from Nasopharyngeal swab specimens and  should not be used as a sole basis for treatment. Nasal washings and aspirates are unacceptable for Xpert Xpress SARS-CoV-2/FLU/RSV testing.  Fact Sheet for Patients: EntrepreneurPulse.com.au  Fact Sheet for Healthcare Providers: IncredibleEmployment.be  This test is not yet approved or cleared by the [place] FDA and has been authorized for detection and/or diagnosis of SARS-CoV-2 by FDA under an Emergency Use Authorization (EUA). This EUA will remain in effect (meaning this test can be used) for the duration of the COVID-19 declaration under Section 564(b)(1) of the Act, 21 U.S.C. section 360bbb-3(b)(1), unless the authorization is terminated or revoked.  Performed at [facility], [address] [place], [place] [address]          Radiology Studies: CT ANGIO HEAD W OR WO CONTRAST  Result Date:  10/23/2020 CLINICAL DATA:  Dizziness.  Blurred vision.  Unsteady gait. EXAM: CT ANGIOGRAPHY HEAD AND NECK TECHNIQUE: Multidetector CT imaging of the head and neck was performed using the standard protocol during bolus administration of intravenous contrast. Multiplanar CT image reconstructions and MIPs were obtained to evaluate the vascular anatomy. Carotid stenosis measurements (when applicable) are obtained utilizing NASCET criteria, using the distal internal carotid diameter as the denominator. CONTRAST:  45mL OMNIPAQUE IOHEXOL 350 MG/ML SOLN COMPARISON:  CT head without contrast 10/23/2020 FINDINGS: CTA NECK FINDINGS Aortic arch: Minimal atherosclerotic changes are present within the aortic arch. Three vessel arch configuration is present. No aneurysm stenosis is present. Right carotid system: Right common carotid artery is mildly tortuous without significant stenosis. Bifurcation is unremarkable. Cervical right ICA is normal. Left carotid system: The left common carotid artery within normal limits. Bifurcation is unremarkable. Cervical left ICA is within limits. Vertebral arteries: The right vertebral artery is the dominant vessel. Both vertebral arteries originate from the subclavian arteries without significant stenosis. Is no significant stenosis in either vertebral artery in the neck. Skeleton: Anterior fusion is noted C3-7. Posterior hardware extends to the T2 level. Posterior fusion is present through at least T1. There is probable posterior fusion at T1-2. Hardware is intact. No focal lytic or blastic lesions are present. Other neck: Soft tissues the neck are unremarkable. Upper chest: Paraseptal emphysematous changes are noted at the lung apices. Centrilobular emphysematous changes are present as well. No focal nodule or mass lesion is present. The thoracic inlet is within normal limits. Review of the MIP images confirms the above findings CTA HEAD FINDINGS Anterior circulation: The internal carotid  arteries are within limits the skull base through the ICA termini. The A1 segments are. The anterior communicating artery is not definitively seen. MCA bifurcations are intact. Is some segmental irregularity without a discrete proximal stenosis branch vessel occlusion. Posterior circulation: The right vertebral artery is dominant. PICA origins are visualized and within normal limits. The vertebrobasilar junction is normal. Basilar artery is normal. Both posterior cerebral arteries originate basilar tip. Right posterior communicating artery contributes. PCA branch vessels are within normal. Venous sinuses: The dural sinuses are patent. Straight sinus deep cerebral veins are intact. Cortical veins are within normal limits. No vascular malformation present. Anatomic variants: None Review of the MIP images confirms the above findings IMPRESSION: 1. No emergent large vessel occlusion. 2. Mild segmental irregularity of the circle-of-Willis without a discrete proximal stenosis, aneurysm, or branch vessel occlusion. 3. Minimal atherosclerotic changes at the aortic arch without significant stenosis. 4. Mild tortuosity of the cervical vasculature without significant stenosis in the neck. 5. Postoperative changes of the cervical spine as described. 6. Emphysema (ICD10-J43.9). Electronically Signed  By: San Morelle M.D.   On: 10/23/2020 12:37   DG Chest 2 View  Result Date: 10/23/2020 CLINICAL DATA:  Left shoulder pain Dizziness Blurry vision EXAM: CHEST - 2 VIEW COMPARISON:  10/23/2020 FINDINGS: Cardiomediastinal silhouette and pulmonary vasculature are within normal limits. Cervical fusion hardware partially visualized. Increased interstitial opacities seen throughout both lungs, unchanged from prior examination suggestive of chronic interstitial pneumonitis. IMPRESSION: No acute cardiopulmonary process. Unchanged increased interstitial opacity seen throughout both lungs. Electronically Signed   By: Miachel Roux  M.D.   On: 10/23/2020 12:44   CT ANGIO NECK W OR WO CONTRAST  Result Date: 10/23/2020 CLINICAL DATA:  Dizziness.  Blurred vision.  Unsteady gait. EXAM: CT ANGIOGRAPHY HEAD AND NECK TECHNIQUE: Multidetector CT imaging of the head and neck was performed using the standard protocol during bolus administration of intravenous contrast. Multiplanar CT image reconstructions and MIPs were obtained to evaluate the vascular anatomy. Carotid stenosis measurements (when applicable) are obtained utilizing NASCET criteria, using the distal internal carotid diameter as the denominator. CONTRAST:  43mL OMNIPAQUE IOHEXOL 350 MG/ML SOLN COMPARISON:  CT head without contrast 10/23/2020 FINDINGS: CTA NECK FINDINGS Aortic arch: Minimal atherosclerotic changes are present within the aortic arch. Three vessel arch configuration is present. No aneurysm stenosis is present. Right carotid system: Right common carotid artery is mildly tortuous without significant stenosis. Bifurcation is unremarkable. Cervical right ICA is normal. Left carotid system: The left common carotid artery within normal limits. Bifurcation is unremarkable. Cervical left ICA is within limits. Vertebral arteries: The right vertebral artery is the dominant vessel. Both vertebral arteries originate from the subclavian arteries without significant stenosis. Is no significant stenosis in either vertebral artery in the neck. Skeleton: Anterior fusion is noted C3-7. Posterior hardware extends to the T2 level. Posterior fusion is present through at least T1. There is probable posterior fusion at T1-2. Hardware is intact. No focal lytic or blastic lesions are present. Other neck: Soft tissues the neck are unremarkable. Upper chest: Paraseptal emphysematous changes are noted at the lung apices. Centrilobular emphysematous changes are present as well. No focal nodule or mass lesion is present. The thoracic inlet is within normal limits. Review of the MIP images confirms the  above findings CTA HEAD FINDINGS Anterior circulation: The internal carotid arteries are within limits the skull base through the ICA termini. The A1 segments are. The anterior communicating artery is not definitively seen. MCA bifurcations are intact. Is some segmental irregularity without a discrete proximal stenosis branch vessel occlusion. Posterior circulation: The right vertebral artery is dominant. PICA origins are visualized and within normal limits. The vertebrobasilar junction is normal. Basilar artery is normal. Both posterior cerebral arteries originate basilar tip. Right posterior communicating artery contributes. PCA branch vessels are within normal. Venous sinuses: The dural sinuses are patent. Straight sinus deep cerebral veins are intact. Cortical veins are within normal limits. No vascular malformation present. Anatomic variants: None Review of the MIP images confirms the above findings IMPRESSION: 1. No emergent large vessel occlusion. 2. Mild segmental irregularity of the circle-of-Willis without a discrete proximal stenosis, aneurysm, or branch vessel occlusion. 3. Minimal atherosclerotic changes at the aortic arch without significant stenosis. 4. Mild tortuosity of the cervical vasculature without significant stenosis in the neck. 5. Postoperative changes of the cervical spine as described. 6. Emphysema (ICD10-J43.9). Electronically Signed   By: San Morelle M.D.   On: 10/23/2020 12:37   MR BRAIN WO CONTRAST  Result Date: 10/23/2020 CLINICAL DATA:  Acute neuro deficit. Blurred vision  and dizziness. History of prostate cancer. EXAM: MRI HEAD WITHOUT CONTRAST TECHNIQUE: Multiplanar, multiecho pulse sequences of the brain and surrounding structures were obtained without intravenous contrast. COMPARISON:  None. FINDINGS: Brain: Negative for acute infarct. Mild white matter changes bilaterally. Brainstem and cerebellum normal. Negative for hemorrhage or mass. Ventricle size normal.  Vascular: Normal arterial flow voids Skull and upper cervical spine: No focal skeletal lesion. Surgical hardware in the posterior cervical spine. Sinuses/Orbits: Paranasal sinuses clear.  Negative orbit Other: None IMPRESSION: Negative for acute infarct. Mild chronic white matter changes bilaterally. Electronically Signed   By: Franchot Gallo M.D.   On: 10/23/2020 15:00   ECHOCARDIOGRAM COMPLETE  Result Date: 10/24/2020    ECHOCARDIOGRAM REPORT   Patient Name:   Timothy Matthews Date of Exam: 10/24/2020 Medical Rec #:  [id number]         Height:       70.0 in Accession #:    [id number]        Weight:       180.0 lb Date of Birth:  1946-08-22          BSA:          1.996 m Patient Age:    22 years          BP:           157/89 mmHg Patient Gender: M                 HR:           58 bpm. Exam Location:  [facility] Procedure: 2D Echo, Color Doppler and Cardiac Doppler Indications:     I163.9 Stroke  History:         Patient has no prior history of Echocardiogram examinations.                  Emphysema; Risk Factors:Hypertension.  Sonographer:     Charmayne Sheer RDCS (AE) Referring Phys:  [place] Diagnosing Phys: Kate Sable MD  Sonographer Comments: Suboptimal subcostal window. Image acquisition challenging due to respiratory motion. IMPRESSIONS  1. Left ventricular ejection fraction, by estimation, is 60 to 65%. The left ventricle has normal function. The left ventricle has no regional wall motion abnormalities. Left ventricular diastolic parameters were normal.  2. Right ventricular systolic function is normal. The right ventricular size is normal.  3. The mitral valve is normal in structure. No evidence of mitral valve regurgitation.  4. The aortic valve is tricuspid. Aortic valve regurgitation is not visualized. Mild to moderate aortic valve sclerosis/calcification is present, without any evidence of aortic stenosis. FINDINGS  Left Ventricle: Left ventricular ejection fraction, by estimation, is 60 to  65%. The left ventricle has normal function. The left ventricle has no regional wall motion abnormalities. The left ventricular internal cavity size was normal in size. There is  no left ventricular hypertrophy. Left ventricular diastolic parameters were normal. Right Ventricle: The right ventricular size is normal. No increase in right ventricular wall thickness. Right ventricular systolic function is normal. Left Atrium: Left atrial size was normal in size. Right Atrium: Right atrial size was normal in size. Pericardium: There is no evidence of pericardial effusion. Mitral Valve: The mitral valve is normal in structure. No evidence of mitral valve regurgitation. MV peak gradient, 3.2 mmHg. The mean mitral valve gradient is 1.0 mmHg. Tricuspid Valve: The tricuspid valve is normal in structure. Tricuspid valve regurgitation is not demonstrated. Aortic Valve: The aortic valve is tricuspid. Aortic valve  regurgitation is not visualized. Mild to moderate aortic valve sclerosis/calcification is present, without any evidence of aortic stenosis. Aortic valve mean gradient measures 5.0 mmHg. Aortic valve peak gradient measures 10.6 mmHg. Aortic valve area, by VTI measures 2.94 cm. Pulmonic Valve: The pulmonic valve was normal in structure. Pulmonic valve regurgitation is not visualized. Aorta: The aortic root is normal in size and structure. IAS/Shunts: No atrial level shunt detected by color flow Doppler.  LEFT VENTRICLE PLAX 2D LVIDd:         4.40 cm  Diastology LVIDs:         2.60 cm  LV e' medial:    5.44 cm/s LV PW:         1.00 cm  LV E/e' medial:  16.9 LV IVS:        1.00 cm  LV e' lateral:   9.36 cm/s LVOT diam:     2.20 cm  LV E/e' lateral: 9.8 LV SV:         88 LV SV Index:   44 LVOT Area:     3.80 cm  RIGHT VENTRICLE RV Basal diam:  3.60 cm TAPSE (M-mode): 1.8 cm LEFT ATRIUM             Index       RIGHT ATRIUM           Index LA diam:        4.10 cm 2.05 cm/m  RA Area:     20.50 cm LA Vol (A2C):   25.6 ml  12.83 ml/m RA Volume:   62.50 ml  31.31 ml/m LA Vol (A4C):   55.7 ml 27.91 ml/m LA Biplane Vol: 41.9 ml 20.99 ml/m  AORTIC VALVE                    PULMONIC VALVE AV Area (Vmax):    2.71 cm     PV Vmax:       1.55 m/s AV Area (Vmean):   2.92 cm     PV Vmean:      107.000 cm/s AV Area (VTI):     2.94 cm     PV VTI:        0.287 m AV Vmax:           163.00 cm/s  PV Peak grad:  9.6 mmHg AV Vmean:          102.000 cm/s PV Mean grad:  5.0 mmHg AV VTI:            0.300 m AV Peak Grad:      10.6 mmHg AV Mean Grad:      5.0 mmHg LVOT Vmax:         116.00 cm/s LVOT Vmean:        78.400 cm/s LVOT VTI:          0.232 m LVOT/AV VTI ratio: 0.77  AORTA Ao Root diam: 3.60 cm MITRAL VALVE MV Area (PHT): 2.63 cm    SHUNTS MV Area VTI:   2.34 cm    Systemic VTI:  0.23 m MV Peak grad:  3.2 mmHg    Systemic Diam: 2.20 cm MV Mean grad:  1.0 mmHg MV Vmax:       0.89 m/s MV Vmean:      49.0 cm/s MV Decel Time: 288 msec MV E velocity: 92.10 cm/s MV A velocity: 87.00 cm/s MV E/A ratio:  1.06 Kate Sable MD Electronically signed by Kate Sable MD Signature Date/Time: 10/24/2020/3:39:51  PM    Final    CT HEAD CODE STROKE WO CONTRAST  Addendum Date: 10/23/2020   ADDENDUM REPORT: 10/23/2020 11:47 ADDENDUM: Study discussed by telephone with Dr. Cherylann Banas in the ED on 10/23/2020 at 1144 hours. Electronically Signed   By: Genevie Ann M.D.   On: 10/23/2020 11:47   Result Date: 10/23/2020 CLINICAL DATA:  Code stroke. 75 year old male with blurred vision and dizziness. Hypertensive. EXAM: CT HEAD WITHOUT CONTRAST TECHNIQUE: Contiguous axial images were obtained from the base of the skull through the vertex without intravenous contrast. COMPARISON:  None. FINDINGS: Brain: Cerebral volume is within normal limits for age. No midline shift, ventriculomegaly, mass effect, evidence of mass lesion, intracranial hemorrhage or evidence of cortically based acute infarction. Patchy and confluent bilateral cerebral white matter hypodensity.  Deep gray nuclei appear spared. No cortical encephalomalacia identified. Vascular: No suspicious intracranial vascular hyperdensity. Skull: Negative. Partially visible cervical spine fusion changes on the scout view. Sinuses/Orbits: Visible paranasal sinuses are clear. Tympanic cavities also appear clear. Some chronic sclerotic changes are noted to the mastoids which are relatively well pneumatized. Other: Visualized orbits and scalp soft tissues are within normal limits. ASPECTS [facility] Stroke Program Early CT Score) Total score (0-10 with 10 being normal): 10 IMPRESSION: 1. No acute cortically based infarct or acute intracranial hemorrhage identified. ASPECTS 10. 2. 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 Electronically Signed: By: Genevie Ann M.D. On: 10/23/2020 11:40   Scheduled Meds: .  stroke: mapping our early stages of recovery book   Does not apply Once  . amLODipine  10 mg Oral Daily  . aspirin EC  325 mg Oral Daily  . enoxaparin (LOVENOX) injection  40 mg Subcutaneous Q24H  . gabapentin  300 mg Oral TID  . loratadine  10 mg Oral Daily  . pantoprazole  40 mg Oral Daily  . senna-docusate  1 tablet Oral QHS  . tamsulosin  0.8 mg Oral Daily   Continuous Infusions: . sodium chloride 50 mL/hr at 10/23/20 1456     LOS: 0 days    Time spent: 35 mins.    Shawna Clamp, MD [facility]   If 7PM-7AM, please contact night-coverage

## 2020-10-24 NOTE — Progress Notes (Signed)
Physical Therapy Treatment Patient Details Name: Timothy Matthews MRN: [id number] DOB: 08/02/1946 Today's Date: 10/24/2020    History of Present Illness Per MD notes: Pt is a 75 y.o. male with medical history significant of HTN, Hep C, cervical spine Sx, prostate cancer, emphysema, cocaine abuse, PTSD who presents to the ED with acute onset of vertigo, gait unsteadiness and double vision.  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    PT Comments    Pt was pleasant and motivated to participate during the session.  Pt continued to present with L lateral lean in both sitting and standing but somewhat improved compared to yesterday's session.  Pt continued to require occasional min A to prevent LOB while in standing most notably during sharp turns while walking.  Pt is at a very high risk for falls and will benefit from PT services in a SNF setting upon discharge to safely address deficits listed in patient problem list for decreased caregiver assistance and eventual return to PLOF.       Follow Up Recommendations  SNF;Supervision for mobility/OOB     Equipment Recommendations  Rolling walker with 5" wheels    Recommendations for Other Services       Precautions / Restrictions Precautions Precautions: Fall Restrictions Weight Bearing Restrictions: No    Mobility  Bed Mobility Overal bed mobility: Independent             General bed mobility comments: Good speed and effort with bed mobility tasks  Transfers Overall transfer level: Needs assistance Equipment used: Rolling walker (2 wheeled) Transfers: Sit to/from Stand Sit to Stand: Min assist         General transfer comment: Min A for stability at times upon coming to full standing secondary to left lateral instability  Ambulation/Gait Ambulation/Gait assistance: Min assist Gait Distance (Feet): 125 Feet Assistive device: Rolling walker (2  wheeled) Gait Pattern/deviations: Step-through pattern;Decreased step length - right;Decreased step length - left Gait velocity: decreased   General Gait Details: Min A for stability at times during ambulation secondary to left lateral instability; mod verbal cues for general sequencing and safe use of the RW   Stairs             Wheelchair Mobility    Modified Rankin (Stroke Patients Only)       Balance Overall balance assessment: Needs assistance Sitting-balance support: Feet supported;No upper extremity supported Sitting balance-Leahy Scale: Fair Sitting balance - Comments: Pt with min left lateral lean but able to maintain sitting balance despite weight shift Postural control: Left lateral lean Standing balance support: Bilateral upper extremity supported;During functional activity Standing balance-Leahy Scale: Poor Standing balance comment: Min A for stability at times secondary to left lateral instability                            Cognition Arousal/Alertness: Awake/alert Behavior During Therapy: WFL for tasks assessed/performed Overall Cognitive Status: Within Functional Limits for tasks assessed                                        Exercises Total Joint Exercises Ankle Circles/Pumps: Strengthening;Both;5 reps;10 reps (with manual resistance) Quad Sets: Strengthening;Both;10 reps Gluteal Sets: Strengthening;Both;10 reps Hip ABduction/ADduction: Strengthening;Both;10 reps (with manual resistance) Long Arc Quad: Strengthening;Both;10 reps (with manual resistance) Knee Flexion: Strengthening;Both;10  reps (with manual resistance) Other Exercises: R lateral weight shifting activities in sitting/standing to address L lateral lean Other Exercises: HEP education/review for BLE APs, QS, and GS x 10 each every 1-2 hours daily    General Comments        Pertinent Vitals/Pain Pain Assessment: No/denies pain    Home Living                       Prior Function            PT Goals (current goals can now be found in the care plan section) Progress towards PT goals: Progressing toward goals    Frequency    7X/week      PT Plan Current plan remains appropriate    Co-evaluation              AM-PAC PT "6 Clicks" Mobility   Outcome Measure  Help needed turning from your back to your side while in a flat bed without using bedrails?: None Help needed moving from lying on your back to sitting on the side of a flat bed without using bedrails?: None Help needed moving to and from a bed to a chair (including a wheelchair)?: A Little Help needed standing up from a chair using your arms (e.g., wheelchair or bedside chair)?: A Little Help needed to walk in hospital room?: A Little Help needed climbing 3-5 steps with a railing? : A Little 6 Click Score: 20    End of Session Equipment Utilized During Treatment: Gait belt Activity Tolerance: Patient tolerated treatment well Patient left: in bed;with call bell/phone within reach;Other (comment) (One bed rail per pt request, ok per nsg) Nurse Communication: Mobility status PT Visit Diagnosis: Unsteadiness on feet (R26.81);History of falling (Z91.81);Difficulty in walking, not elsewhere classified (R26.2)     Time: 1751-0258 PT Time Calculation (min) (ACUTE ONLY): 28 min  Charges:  $Gait Training: 8-22 mins $Therapeutic Exercise: 8-22 mins                     D. Scott Tahmid Stonehocker PT, DPT 10/24/20, 12:05 PM

## 2020-10-24 NOTE — ED Notes (Signed)
Pt eating breakfast at this time. OT at bedside

## 2020-10-24 NOTE — ED Notes (Signed)
ED TO INPATIENT HANDOFF REPORT  ED Nurse Name and Phone #: Deep Bonawitz [phone]  S Name/Age/Gender Timothy Matthews 75 y.o. male Room/Bed: ED33A/ED33A  Code Status   Code Status: Full Code  Home/SNF/Other Home Patient oriented to: self, place, time and situation Is this baseline? Yes   Triage Complete: Triage complete  Chief Complaint CVA (cerebral vascular accident) [facility]) [I63.9]  Triage Note Pt states blurry vision, dizziness and almost fell walking in to waiting room. This RN caught pt and assisted to wheelchair. LKW was 0900     Allergies No Known Allergies  Level of Care/Admitting Diagnosis ED Disposition    ED Disposition Condition [place]: [place] [[id number]]  Level of Care: Progressive Cardiac [[id number]]  Admit to Progressive based on following criteria: NEUROLOGICAL AND NEUROSURGICAL complex patients with significant risk of instability, who do not meet ICU criteria, yet require close observation or frequent assessment (< / = every 2 - 4 hours) with medical / nursing intervention.  Covid Evaluation: Confirmed COVID Negative  Diagnosis: [facility]bral vascular [id number]  Admitting Physician: Neena Rhymes [[id number]]  Attending Physician: Neena Rhymes [[id number]]       B Medical/Surgery History Past Medical History:  Diagnosis Date  . Cancer ([place])   . Cocaine abuse ([place])    last ED encounter 04/07/20  . Emphysema lung ([place])    pan-lobar by CT 2017  . GERD (gastroesophageal reflux disease)   . Hepatitis C   . Hypertension   . Prostate cancer ([place]) BPH  . PTSD (post-traumatic stress disorder)    Past Surgical History:  Procedure Laterality Date  . CERVICAL SPINE SURGERY  2017   Done at [facility]  . LUMBAR SPINE SURGERY    . TONSILLECTOMY    . TRANSURETHRAL RESECTION OF PROSTATE       A IV Location/Drains/Wounds Patient Lines/Drains/Airways Status    Active Line/Drains/Airways    Name Placement date  Placement time Site Days   Peripheral IV 10/23/20 Anterior;Right Forearm 10/23/20  1153  Forearm  1   Peripheral IV 10/23/20 Left Forearm 10/23/20  1238  Forearm  1          Intake/Output Last 24 hours  Intake/Output Summary (Last 24 hours) at 10/24/2020 1257 Last data filed at 10/24/2020 [id number] Gross per 24 hour  Intake -  Output 1200 ml  Net -1200 ml    Labs/Imaging Results for orders placed or performed during the hospital encounter of 10/23/20 (from the past 48 hour(s))  CBG monitoring, ED     Status: None   Collection Time: 10/23/20 11:25 AM  Result Value Ref Range   Glucose-Capillary 83 70 - 99 mg/dL    Comment: Glucose reference range applies only to samples taken after fasting for at least 8 hours.  Protime-INR     Status: None   Collection Time: 10/23/20 12:10 PM  Result Value Ref Range   Prothrombin Time 13.6 11.4 - 15.2 seconds   INR 1.1 0.8 - 1.2    Comment: (NOTE) INR goal varies based on device and disease states. Performed at [facility], [address] [place], [place] [address]   APTT     Status: None   Collection Time: 10/23/20 12:10 PM  Result Value Ref Range   aPTT 33 24 - 36 seconds    Comment: Performed at [facility], [address] [place], [place] [address]  CBC     Status: None  Collection Time: 10/23/20 12:10 PM  Result Value Ref Range   WBC 6.3 4.0 - 10.5 K/uL   RBC 4.96 4.22 - 5.81 MIL/uL   Hemoglobin 13.6 13.0 - 17.0 g/dL   HCT 42.8 39.0 - 52.0 %   MCV 86.3 80.0 - 100.0 fL   MCH 27.4 26.0 - 34.0 pg   MCHC 31.8 30.0 - 36.0 g/dL   RDW 14.6 11.5 - 15.5 %   Platelets 217 150 - 400 K/uL   nRBC 0.0 0.0 - 0.2 %    Comment: Performed at [facility], [address] [place], [place] [address]  Differential     Status: None   Collection Time: 10/23/20 12:10 PM  Result Value Ref Range   Neutrophils Relative % 53 %   Neutro Abs 3.3 1.7 - 7.7 K/uL   Lymphocytes Relative 34 %   Lymphs Abs 2.1 0.7 - 4.0 K/uL    Monocytes Relative 9 %   Monocytes Absolute 0.6 0.1 - 1.0 K/uL   Eosinophils Relative 3 %   Eosinophils Absolute 0.2 0.0 - 0.5 K/uL   Basophils Relative 1 %   Basophils Absolute 0.0 0.0 - 0.1 K/uL   Immature Granulocytes 0 %   Abs Immature Granulocytes 0.02 0.00 - 0.07 K/uL    Comment: Performed at [facility], [address] [place], [place] [address]  Comprehensive metabolic panel     Status: Abnormal   Collection Time: 10/23/20 12:10 PM  Result Value Ref Range   Sodium 136 135 - 145 mmol/L   Potassium 3.3 (L) 3.5 - 5.1 mmol/L   Chloride 106 98 - 111 mmol/L   CO2 25 22 - 32 mmol/L   Glucose, Bld 94 70 - 99 mg/dL    Comment: Glucose reference range applies only to samples taken after fasting for at least 8 hours.   BUN 18 8 - 23 mg/dL   Creatinine, Ser 0.98 0.61 - 1.24 mg/dL   Calcium 7.8 (L) 8.9 - 10.3 mg/dL   Total Protein 6.6 6.5 - 8.1 g/dL   Albumin 2.8 (L) 3.5 - 5.0 g/dL   AST 25 15 - 41 U/L   ALT 15 0 - 44 U/L   Alkaline Phosphatase 56 38 - 126 U/L   Total Bilirubin 0.4 0.3 - 1.2 mg/dL   GFR, Estimated >60 >60 mL/min    Comment: (NOTE) Calculated using the CKD-EPI Creatinine Equation (2021)    Anion gap 5 5 - 15    Comment: Performed at [facility], [address], [place], [place] [address]  Resp Panel by RT-PCR (Flu A&B, Covid) Nasopharyngeal Swab     Status: None   Collection Time: 10/23/20 12:10 PM   Specimen: Nasopharyngeal Swab; Nasopharyngeal(NP) swabs in vial transport medium  Result Value Ref Range   SARS Coronavirus 2 by RT PCR NEGATIVE NEGATIVE    Comment: (NOTE) SARS-CoV-2 target nucleic acids are NOT DETECTED.  The SARS-CoV-2 RNA is generally detectable in upper respiratory specimens during the acute phase of infection. The lowest concentration of SARS-CoV-2 viral copies this assay can detect is 138 copies/mL. A negative result does not preclude SARS-Cov-2 infection and should not be used as the sole basis for treatment or other  patient management decisions. A negative result may occur with  improper specimen collection/handling, submission of specimen other than nasopharyngeal swab, presence of viral mutation(s) within the areas targeted by this assay, and inadequate number of viral copies(<138 copies/mL). A negative result must be combined with clinical  observations, patient history, and epidemiological information. The expected result is Negative.  Fact Sheet for Patients:  EntrepreneurPulse.com.au  Fact Sheet for Healthcare Providers:  IncredibleEmployment.be  This test is no t yet approved or cleared by the [place] FDA and  has been authorized for detection and/or diagnosis of SARS-CoV-2 by FDA under an Emergency Use Authorization (EUA). This EUA will remain  in effect (meaning this test can be used) for the duration of the COVID-19 declaration under Section 564(b)(1) of the Act, 21 U.S.C.section 360bbb-3(b)(1), unless the authorization is terminated  or revoked sooner.       Influenza A by PCR NEGATIVE NEGATIVE   Influenza B by PCR NEGATIVE NEGATIVE    Comment: (NOTE) The Xpert Xpress SARS-CoV-2/FLU/RSV plus assay is intended as an aid in the diagnosis of influenza from Nasopharyngeal swab specimens and should not be used as a sole basis for treatment. Nasal washings and aspirates are unacceptable for Xpert Xpress SARS-CoV-2/FLU/RSV testing.  Fact Sheet for Patients: EntrepreneurPulse.com.au  Fact Sheet for Healthcare Providers: IncredibleEmployment.be  This test is not yet approved or cleared by the [place] FDA and has been authorized for detection and/or diagnosis of SARS-CoV-2 by FDA under an Emergency Use Authorization (EUA). This EUA will remain in effect (meaning this test can be used) for the duration of the COVID-19 declaration under Section 564(b)(1) of the Act, 21 U.S.C. section 360bbb-3(b)(1), unless  the authorization is terminated or revoked.  Performed at [facility], [place], [place] [address]   Troponin I (High Sensitivity)     Status: None   Collection Time: 10/23/20 12:10 PM  Result Value Ref Range   Troponin I (High Sensitivity) 10 <18 ng/L    Comment: (NOTE) Elevated high sensitivity troponin I (hsTnI) values and significant  changes across serial measurements may suggest ACS but many other  chronic and acute conditions are known to elevate hsTnI results.  Refer to the Links section for chest pain algorithms and additional  guidance. Performed at [facility], [address] [place], [place] [address]   TSH     Status: None   Collection Time: 10/23/20 12:10 PM  Result Value Ref Range   TSH 1.640 0.350 - 4.500 uIU/mL    Comment: Performed by a 3rd Generation assay with a functional sensitivity of <=0.01 uIU/mL. Performed at [facility], [place], [place] [address]   Troponin I (High Sensitivity)     Status: None   Collection Time: 10/23/20  2:57 PM  Result Value Ref Range   Troponin I (High Sensitivity) 11 <18 ng/L    Comment: (NOTE) Elevated high sensitivity troponin I (hsTnI) values and significant  changes across serial measurements may suggest ACS but many other  chronic and acute conditions are known to elevate hsTnI results.  Refer to the "Links" section for chest pain algorithms and additional  guidance. Performed at [facility], [address] [place], [place] [address]   Hemoglobin A1c     Status: None   Collection Time: 10/24/20  4:26 AM  Result Value Ref Range   Hgb A1c MFr Bld 5.6 4.8 - 5.6 %    Comment: (NOTE) Pre diabetes:          5.7%-6.4%  Diabetes:              >6.4%  Glycemic control for   <7.0% adults with diabetes    Mean Plasma Glucose 114.02 mg/dL    Comment: Performed at  [facility], [place] [address] [place], [place] [address]  Lipid panel      Status: None   Collection Time: 10/24/20  4:26 AM  Result Value Ref Range   Cholesterol 135 0 - 200 mg/dL   Triglycerides 109 <150 mg/dL   HDL 49 >40 mg/dL   Total CHOL/HDL Ratio 2.8 RATIO   VLDL 22 0 - 40 mg/dL   LDL Cholesterol 64 0 - 99 mg/dL    Comment:        Total Cholesterol/HDL:CHD Risk Coronary Heart Disease Risk Table                     Men   Women  1/2 Average Risk   3.4   3.3  Average Risk       5.0   4.4  2 X Average Risk   9.6   7.1  3 X Average Risk  23.4   11.0        Use the calculated Patient Ratio above and the CHD Risk Table to determine the patient's CHD Risk.        ATP III CLASSIFICATION (LDL):  <100     mg/dL   Optimal  100-129  mg/dL   Near or Above                    Optimal  130-159  mg/dL   Borderline  160-189  mg/dL   High  >190     mg/dL   Very High Performed at [facility], [place], [place] [address]    CT ANGIO HEAD W OR WO CONTRAST  Result Date: 10/23/2020 CLINICAL DATA:  Dizziness.  Blurred vision.  Unsteady gait. EXAM: CT ANGIOGRAPHY HEAD AND NECK TECHNIQUE: Multidetector CT imaging of the head and neck was performed using the standard protocol during bolus administration of intravenous contrast. Multiplanar CT image reconstructions and MIPs were obtained to evaluate the vascular anatomy. Carotid stenosis measurements (when applicable) are obtained utilizing NASCET criteria, using the distal internal carotid diameter as the denominator. CONTRAST:  53mL OMNIPAQUE IOHEXOL 350 MG/ML SOLN COMPARISON:  CT head without contrast 10/23/2020 FINDINGS: CTA NECK FINDINGS Aortic arch: Minimal atherosclerotic changes are present within the aortic arch. Three vessel arch configuration is present. No aneurysm stenosis is present. Right carotid system: Right common carotid artery is mildly tortuous without significant stenosis. Bifurcation is unremarkable. Cervical right ICA is normal. Left carotid system: The left common carotid artery  within normal limits. Bifurcation is unremarkable. Cervical left ICA is within limits. Vertebral arteries: The right vertebral artery is the dominant vessel. Both vertebral arteries originate from the subclavian arteries without significant stenosis. Is no significant stenosis in either vertebral artery in the neck. Skeleton: Anterior fusion is noted C3-7. Posterior hardware extends to the T2 level. Posterior fusion is present through at least T1. There is probable posterior fusion at T1-2. Hardware is intact. No focal lytic or blastic lesions are present. Other neck: Soft tissues the neck are unremarkable. Upper chest: Paraseptal emphysematous changes are noted at the lung apices. Centrilobular emphysematous changes are present as well. No focal nodule or mass lesion is present. The thoracic inlet is within normal limits. Review of the MIP images confirms the above findings CTA HEAD FINDINGS Anterior circulation: The internal carotid arteries are within limits the skull base through the ICA termini. The A1 segments are. The anterior communicating artery is not definitively seen. MCA bifurcations are intact. Is some  segmental irregularity without a discrete proximal stenosis branch vessel occlusion. Posterior circulation: The right vertebral artery is dominant. PICA origins are visualized and within normal limits. The vertebrobasilar junction is normal. Basilar artery is normal. Both posterior cerebral arteries originate basilar tip. Right posterior communicating artery contributes. PCA branch vessels are within normal. Venous sinuses: The dural sinuses are patent. Straight sinus deep cerebral veins are intact. Cortical veins are within normal limits. No vascular malformation present. Anatomic variants: None Review of the MIP images confirms the above findings IMPRESSION: 1. No emergent large vessel occlusion. 2. Mild segmental irregularity of the circle-of-Willis without a discrete proximal stenosis, aneurysm, or  branch vessel occlusion. 3. Minimal atherosclerotic changes at the aortic arch without significant stenosis. 4. Mild tortuosity of the cervical vasculature without significant stenosis in the neck. 5. Postoperative changes of the cervical spine as described. 6. Emphysema (ICD10-J43.9). Electronically Signed   By: Marin Robertshristopher  Mattern M.D.   On: 10/23/2020 12:37   DG Chest 2 View  Result Date: 10/23/2020 CLINICAL DATA:  Left shoulder pain Dizziness Blurry vision EXAM: CHEST - 2 VIEW COMPARISON:  10/23/2020 FINDINGS: Cardiomediastinal silhouette and pulmonary vasculature are within normal limits. Cervical fusion hardware partially visualized. Increased interstitial opacities seen throughout both lungs, unchanged from prior examination suggestive of chronic interstitial pneumonitis. IMPRESSION: No acute cardiopulmonary process. Unchanged increased interstitial opacity seen throughout both lungs. Electronically Signed   By: Acquanetta BellingFarhaan  Mir M.D.   On: 10/23/2020 12:44   CT ANGIO NECK W OR WO CONTRAST  Result Date: 10/23/2020 CLINICAL DATA:  Dizziness.  Blurred vision.  Unsteady gait. EXAM: CT ANGIOGRAPHY HEAD AND NECK TECHNIQUE: Multidetector CT imaging of the head and neck was performed using the standard protocol during bolus administration of intravenous contrast. Multiplanar CT image reconstructions and MIPs were obtained to evaluate the vascular anatomy. Carotid stenosis measurements (when applicable) are obtained utilizing NASCET criteria, using the distal internal carotid diameter as the denominator. CONTRAST:  75mL OMNIPAQUE IOHEXOL 350 MG/ML SOLN COMPARISON:  CT head without contrast 10/23/2020 FINDINGS: CTA NECK FINDINGS Aortic arch: Minimal atherosclerotic changes are present within the aortic arch. Three vessel arch configuration is present. No aneurysm stenosis is present. Right carotid system: Right common carotid artery is mildly tortuous without significant stenosis. Bifurcation is unremarkable.  Cervical right ICA is normal. Left carotid system: The left common carotid artery within normal limits. Bifurcation is unremarkable. Cervical left ICA is within limits. Vertebral arteries: The right vertebral artery is the dominant vessel. Both vertebral arteries originate from the subclavian arteries without significant stenosis. Is no significant stenosis in either vertebral artery in the neck. Skeleton: Anterior fusion is noted C3-7. Posterior hardware extends to the T2 level. Posterior fusion is present through at least T1. There is probable posterior fusion at T1-2. Hardware is intact. No focal lytic or blastic lesions are present. Other neck: Soft tissues the neck are unremarkable. Upper chest: Paraseptal emphysematous changes are noted at the lung apices. Centrilobular emphysematous changes are present as well. No focal nodule or mass lesion is present. The thoracic inlet is within normal limits. Review of the MIP images confirms the above findings CTA HEAD FINDINGS Anterior circulation: The internal carotid arteries are within limits the skull base through the ICA termini. The A1 segments are. The anterior communicating artery is not definitively seen. MCA bifurcations are intact. Is some segmental irregularity without a discrete proximal stenosis branch vessel occlusion. Posterior circulation: The right vertebral artery is dominant. PICA origins are visualized and within normal limits. The vertebrobasilar  junction is normal. Basilar artery is normal. Both posterior cerebral arteries originate basilar tip. Right posterior communicating artery contributes. PCA branch vessels are within normal. Venous sinuses: The dural sinuses are patent. Straight sinus deep cerebral veins are intact. Cortical veins are within normal limits. No vascular malformation present. Anatomic variants: None Review of the MIP images confirms the above findings IMPRESSION: 1. No emergent large vessel occlusion. 2. Mild segmental  irregularity of the circle-of-Willis without a discrete proximal stenosis, aneurysm, or branch vessel occlusion. 3. Minimal atherosclerotic changes at the aortic arch without significant stenosis. 4. Mild tortuosity of the cervical vasculature without significant stenosis in the neck. 5. Postoperative changes of the cervical spine as described. 6. Emphysema (ICD10-J43.9). Electronically Signed   By: San Morelle M.D.   On: 10/23/2020 12:37   MR BRAIN WO CONTRAST  Result Date: 10/23/2020 CLINICAL DATA:  Acute neuro deficit. Blurred vision and dizziness. History of prostate cancer. EXAM: MRI HEAD WITHOUT CONTRAST TECHNIQUE: Multiplanar, multiecho pulse sequences of the brain and surrounding structures were obtained without intravenous contrast. COMPARISON:  None. FINDINGS: Brain: Negative for acute infarct. Mild white matter changes bilaterally. Brainstem and cerebellum normal. Negative for hemorrhage or mass. Ventricle size normal. Vascular: Normal arterial flow voids Skull and upper cervical spine: No focal skeletal lesion. Surgical hardware in the posterior cervical spine. Sinuses/Orbits: Paranasal sinuses clear.  Negative orbit Other: None IMPRESSION: Negative for acute infarct. Mild chronic white matter changes bilaterally. Electronically Signed   By: Franchot Gallo M.D.   On: 10/23/2020 15:00   CT HEAD CODE STROKE WO CONTRAST  Addendum Date: 10/23/2020   ADDENDUM REPORT: 10/23/2020 11:47 ADDENDUM: Study discussed by telephone with Dr. Cherylann Banas in the ED on 10/23/2020 at 1144 hours. Electronically Signed   By: Genevie Ann M.D.   On: 10/23/2020 11:47   Result Date: 10/23/2020 CLINICAL DATA:  Code stroke. 75 year old male with blurred vision and dizziness. Hypertensive. EXAM: CT HEAD WITHOUT CONTRAST TECHNIQUE: Contiguous axial images were obtained from the base of the skull through the vertex without intravenous contrast. COMPARISON:  None. FINDINGS: Brain: Cerebral volume is within normal limits for  age. No midline shift, ventriculomegaly, mass effect, evidence of mass lesion, intracranial hemorrhage or evidence of cortically based acute infarction. Patchy and confluent bilateral cerebral white matter hypodensity. Deep gray nuclei appear spared. No cortical encephalomalacia identified. Vascular: No suspicious intracranial vascular hyperdensity. Skull: Negative. Partially visible cervical spine fusion changes on the scout view. Sinuses/Orbits: Visible paranasal sinuses are clear. Tympanic cavities also appear clear. Some chronic sclerotic changes are noted to the mastoids which are relatively well pneumatized. Other: Visualized orbits and scalp soft tissues are within normal limits. ASPECTS [facility] Stroke Program Early CT Score) Total score (0-10 with 10 being normal): 10 IMPRESSION: 1. No acute cortically based infarct or acute intracranial hemorrhage identified. ASPECTS 10. 2. 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 Electronically Signed: By: Genevie Ann M.D. On: 10/23/2020 11:40    Pending Labs Unresulted Labs (From admission, onward)          Start     Ordered   10/30/20 0500  Creatinine, serum  (enoxaparin (LOVENOX)    CrCl >/= 30 ml/min)  Weekly,   STAT     Comments: while on enoxaparin therapy    10/23/20 1341          Vitals/Pain Today's Vitals   10/24/20 0947 10/24/20 1000 10/24/20 1215 10/24/20 1255  BP: (!) 154/102   Marland Kitchen)  148/93  Pulse:  61 60 85  Resp:  (!) 24 (!) 26 17  Temp:    98.1 F (36.7 C)  TempSrc:    Oral  SpO2:  92% 95% 96%  PainSc:    4     Isolation Precautions No active isolations  Medications Medications  albuterol (VENTOLIN HFA) 108 (90 Base) MCG/ACT inhaler 2 puff (has no administration in time range)   stroke: mapping our early stages of recovery book (has no administration in time range)  0.9 %  sodium chloride infusion ( Intravenous New Bag/Given 10/23/20 1456)  acetaminophen (TYLENOL)  tablet 650 mg (has no administration in time range)    Or  acetaminophen (TYLENOL) 160 MG/5ML solution 650 mg (has no administration in time range)    Or  acetaminophen (TYLENOL) suppository 650 mg (has no administration in time range)  enoxaparin (LOVENOX) injection 40 mg (40 mg Subcutaneous Given 10/24/20 0120)  senna-docusate (Senokot-S) tablet 1 tablet (1 tablet Oral Given 10/24/20 0120)  ibuprofen (ADVIL) tablet 800 mg (800 mg Oral Given 10/23/20 1542)  amLODipine (NORVASC) tablet 10 mg (10 mg Oral Given 10/24/20 0947)  tamsulosin (FLOMAX) capsule 0.8 mg (0.8 mg Oral Given 10/24/20 0947)  gabapentin (NEURONTIN) capsule 300 mg (300 mg Oral Given 10/24/20 0947)  loratadine (CLARITIN) tablet 10 mg (10 mg Oral Given 10/24/20 0947)  pantoprazole (PROTONIX) EC tablet 40 mg (40 mg Oral Given 10/24/20 0948)  aspirin EC tablet 325 mg (325 mg Oral Given 10/24/20 0947)  sodium chloride flush (NS) 0.9 % injection 3 mL (3 mLs Intravenous Given 10/23/20 1216)  iohexol (OMNIPAQUE) 350 MG/ML injection 75 mL (75 mLs Intravenous Contrast Given 10/23/20 1153)  aspirin EC tablet 650 mg (650 mg Oral Given 10/23/20 1215)  potassium chloride SA (KLOR-CON) CR tablet 40 mEq (40 mEq Oral Given 10/23/20 1456)    Mobility walks with device Low fall risk   Focused Assessments Neuro Assessment Handoff:  Swallow screen pass? Yes    NIH Stroke Scale ( + Modified Stroke Scale Criteria)  Interval: Initial Level of Consciousness (1a.)   : Alert, keenly responsive LOC Questions (1b. )   +: Answers both questions correctly LOC Commands (1c. )   + : Performs both tasks correctly Best Gaze (2. )  +: Normal Visual (3. )  +: No visual loss Facial Palsy (4. )    : Normal symmetrical movements Motor Arm, Left (5a. )   +: No drift Motor Arm, Right (5b. )   +: No drift Motor Leg, Left (6a. )   +: No drift Motor Leg, Right (6b. )   +: No drift Limb Ataxia (7. ): Absent Sensory (8. )   +: Normal, no sensory loss Best Language  (9. )   +: No aphasia Dysarthria (10. ): Normal Extinction/Inattention (11.)   +: No Abnormality Modified SS Total  +: 0 Complete NIHSS TOTAL: 0 Last date known well: 10/23/20 Last time known well: 0900 Neuro Assessment:   Neuro Checks:   Initial (10/23/20 1300)  Last Documented NIHSS Modified Score: 0 (10/23/20 1300) Has TPA been given? No If patient is a Neuro Trauma and patient is going to OR before floor call report to [place] nurse: [phone] or [phone]     R Recommendations: See Admitting Provider Note  Report given to:   Additional Notes:

## 2020-10-24 NOTE — Progress Notes (Signed)
SLP Cancellation Note  Patient Details Name: Timothy Matthews MRN: [id number] DOB: 09/22/1946   Cancelled treatment:       Reason Eval/Treat Not Completed: SLP screened, no needs identified, will sign off  Brandis Matsuura B. Rutherford Nail M.S., CCC-SLP, [place] Office [phone]  Stormy Fabian 10/24/2020, 2:51 PM

## 2020-10-24 NOTE — Progress Notes (Signed)
*  PRELIMINARY RESULTS* Echocardiogram 2D Echocardiogram has been performed.  Timothy Matthews 10/24/2020, 10:09 AM

## 2020-10-24 NOTE — Plan of Care (Signed)

## 2020-10-24 NOTE — ED Notes (Signed)
Report given to Mac RN 

## 2020-10-25 DIAGNOSIS — G459 Transient cerebral ischemic attack, unspecified: Secondary | ICD-10-CM | POA: Diagnosis not present

## 2020-10-25 DIAGNOSIS — Z981 Arthrodesis status: Secondary | ICD-10-CM | POA: Diagnosis not present

## 2020-10-25 DIAGNOSIS — R531 Weakness: Secondary | ICD-10-CM | POA: Diagnosis not present

## 2020-10-25 DIAGNOSIS — R29700 NIHSS score 0: Secondary | ICD-10-CM | POA: Diagnosis present

## 2020-10-25 DIAGNOSIS — H5509 Other forms of nystagmus: Secondary | ICD-10-CM | POA: Diagnosis present

## 2020-10-25 DIAGNOSIS — K219 Gastro-esophageal reflux disease without esophagitis: Secondary | ICD-10-CM | POA: Diagnosis not present

## 2020-10-25 DIAGNOSIS — F17210 Nicotine dependence, cigarettes, uncomplicated: Secondary | ICD-10-CM | POA: Diagnosis present

## 2020-10-25 DIAGNOSIS — Z8673 Personal history of transient ischemic attack (TIA), and cerebral infarction without residual deficits: Secondary | ICD-10-CM | POA: Diagnosis not present

## 2020-10-25 DIAGNOSIS — I639 Cerebral infarction, unspecified: Secondary | ICD-10-CM | POA: Diagnosis not present

## 2020-10-25 DIAGNOSIS — J439 Emphysema, unspecified: Secondary | ICD-10-CM | POA: Diagnosis not present

## 2020-10-25 DIAGNOSIS — R451 Restlessness and agitation: Secondary | ICD-10-CM | POA: Diagnosis present

## 2020-10-25 DIAGNOSIS — E876 Hypokalemia: Secondary | ICD-10-CM | POA: Diagnosis present

## 2020-10-25 DIAGNOSIS — Z20822 Contact with and (suspected) exposure to covid-19: Secondary | ICD-10-CM | POA: Diagnosis present

## 2020-10-25 DIAGNOSIS — R7881 Bacteremia: Secondary | ICD-10-CM | POA: Diagnosis not present

## 2020-10-25 DIAGNOSIS — N400 Enlarged prostate without lower urinary tract symptoms: Secondary | ICD-10-CM | POA: Diagnosis present

## 2020-10-25 DIAGNOSIS — R270 Ataxia, unspecified: Secondary | ICD-10-CM | POA: Diagnosis present

## 2020-10-25 DIAGNOSIS — F4310 Post-traumatic stress disorder, unspecified: Secondary | ICD-10-CM | POA: Diagnosis present

## 2020-10-25 DIAGNOSIS — J3489 Other specified disorders of nose and nasal sinuses: Secondary | ICD-10-CM | POA: Diagnosis not present

## 2020-10-25 DIAGNOSIS — Z8546 Personal history of malignant neoplasm of prostate: Secondary | ICD-10-CM | POA: Diagnosis not present

## 2020-10-25 DIAGNOSIS — Z9889 Other specified postprocedural states: Secondary | ICD-10-CM | POA: Diagnosis not present

## 2020-10-25 DIAGNOSIS — I10 Essential (primary) hypertension: Secondary | ICD-10-CM | POA: Diagnosis not present

## 2020-10-25 LAB — BASIC METABOLIC PANEL
Anion gap: 9 (ref 5–15)
BUN: 19 mg/dL (ref 8–23)
CO2: 26 mmol/L (ref 22–32)
Calcium: 8.9 mg/dL (ref 8.9–10.3)
Chloride: 101 mmol/L (ref 98–111)
Creatinine, Ser: 0.99 mg/dL (ref 0.61–1.24)
GFR, Estimated: >60 mL/min (ref >60)
Glucose, Bld: 98 mg/dL (ref 70–99)
Potassium: 3.8 mmol/L (ref 3.5–5.1)
Sodium: 136 mmol/L (ref 135–145)

## 2020-10-25 LAB — CBC
HCT: 40.1 % (ref 39.0–52.0)
Hemoglobin: 13.3 g/dL (ref 13.0–17.0)
MCH: 27.8 pg (ref 26.0–34.0)
MCHC: 33.2 g/dL (ref 30.0–36.0)
MCV: 83.9 fL (ref 80.0–100.0)
Platelets: 210 10*3/uL (ref 150–400)
RBC: 4.78 MIL/uL (ref 4.22–5.81)
RDW: 14.6 % (ref 11.5–15.5)
WBC: 5.7 10*3/uL (ref 4.0–10.5)
nRBC: 0 % (ref 0.0–0.2)

## 2020-10-25 LAB — PHOSPHORUS: Phosphorus: 3.6 mg/dL (ref 2.5–4.6)

## 2020-10-25 LAB — MAGNESIUM: Magnesium: 2 mg/dL (ref 1.7–2.4)

## 2020-10-25 NOTE — Progress Notes (Signed)
PROGRESS NOTE    Timothy Matthews  [id number] DOB: [birth year] DOA: 10/23/2020 PCP: Will Bonnet, MD   Brief Narrative:  This 75 years old male with PMH significant for hypertension, prostate cancer, emphysema, cocaine abuse, PTSD who presented to the ED with acute onset of vertigo, unsteadiness and double vision.  Patient was admitted to rule out a stroke.  CT head without acute finding.  CTA head and neck without LVO,  mild segmental irregularity.  Patient was evaluated by neurology,  offered tPA but patient declined.  MRI head without any acute abnormalities.  Patient continues to have double vision, neurology recommended TEE to rule out mural emboli.  Cardiology consulted,  scheduled to to have TEE on 1/14.  Assessment & Plan:   Active Problems:   CVA (cerebral vascular accident) ([place])   Cocaine abuse ([place])   HTN (hypertension)   Emphysema lung ([facility])   GERD (gastroesophageal reflux disease)   Vertigo, unsteadiness and double vision could be secondary to CVA -  Patient was admitted for stroke work-up.  Neurology consulted. most likely brainstem event vs cerebellar event. Pt offered and declined TPA. Continue telemetry PT/OT and speech evaluation. MRI no acute abnormalities noted, patient continues to have double vision. TTE: LVEF 60 to 65%, no regional wall motion abnormality. Neurology recommended TEE to rule out mural emboli. Cardiology consulted scheduled to have TEE tomorrow morning. Continue aspirin and statin Smoking cessation, cocaine cessation.   HTN - permissive hypertension during first 24 hours. Will resume home meds - list from [place] currently pending  Emphysema - established problem Continue Smoking cessation Continue albuterol MDI prn  Cocaine abuse - long standing problem. Social worker consult. Obtain urine drug screen for presence of cocaine For agitation/restlessness Ativan q4  GERD - long standing problem. NO report of esophagitis. Continue  PPI tx    DVT prophylaxis: Lovenox Code Status: Full code Family Communication: No one at bedside  Disposition Plan:   Status is: Inpatient  Remains inpatient appropriate because:Inpatient level of care appropriate due to severity of illness   Dispo: The patient is from: Home              Anticipated d/c is to: Home              Anticipated d/c date is: 2 days              Patient currently is not medically stable to d/c.   Consultants:   NEUROLOGY  Cardiology  Procedures: MRI head, echocardiogram Antimicrobials:  Anti-infectives (From admission, onward)   None      Subjective: Patient was seen and examined at bedside.  Overnight events noted.  Patient reports feeling much better.  Patient continues to have double vision.  Cardiology consulted,  Patient is scheduled to have TEE tomorrow morning. Objective: Vitals:   10/25/20 0318 10/25/20 0740 10/25/20 1131 10/25/20 1612  BP: (!) 145/79 (!) 150/93 (!) 144/90 (!) 140/92  Pulse: 64 61 67 73  Resp: 19 18 18 19   Temp: 97.6 F (36.4 C) 98.6 F (37 C) 98.5 F (36.9 C) 98.6 F (37 C)  TempSrc:   Oral Oral  SpO2: 97% 93% 94% 95%  Weight: 84.9 kg       Intake/Output Summary (Last 24 hours) at 10/25/2020 1616 Last data filed at 10/25/2020 1612 Gross per 24 hour  Intake 2505.11 ml  Output 300 ml  Net 2205.11 ml   Filed Weights   10/25/20 0318  Weight: 84.9 kg  Examination:  General exam: Appears calm and comfortable , not in any acute distress Respiratory system: Clear to auscultation. Respiratory effort normal. Cardiovascular system: S1 & S2 heard, RRR. No JVD, murmurs, rubs, gallops or clicks. No pedal edema. Gastrointestinal system: Abdomen is nondistended, soft and nontender. No organomegaly or masses felt. Normal bowel sounds heard. Central nervous system: Alert and oriented. No focal neurological deficits. Extremities: Symmetric 5 x 5 power.  No edema, no clubbing, no cyanosis Skin: No rashes, lesions  or ulcers Psychiatry: Judgement and insight appear normal. Mood & affect appropriate.     Data Reviewed: I have personally reviewed following labs and imaging studies  CBC: Recent Labs  Lab 10/23/20 1210 10/25/20 0619  WBC 6.3 5.7  NEUTROABS 3.3  --   HGB 13.6 13.3  HCT 42.8 40.1  MCV 86.3 83.9  PLT 217 [id number]   Basic Metabolic Panel: Recent Labs  Lab 10/23/20 1210 10/25/20 0619  NA 136 136  K 3.3* 3.8  CL 106 101  CO2 25 26  GLUCOSE 94 98  BUN 18 19  CREATININE 0.98 0.99  CALCIUM 7.8* 8.9  MG  --  2.0  PHOS  --  3.6   GFR: CrCl cannot be calculated (Unknown ideal weight.). Liver Function Tests: Recent Labs  Lab 10/23/20 1210  AST 25  ALT 15  ALKPHOS 56  BILITOT 0.4  PROT 6.6  ALBUMIN 2.8*   No results for input(s): LIPASE, AMYLASE in the last 168 hours. No results for input(s): AMMONIA in the last 168 hours. Coagulation Profile: Recent Labs  Lab 10/23/20 1210  INR 1.1   Cardiac Enzymes: No results for input(s): CKTOTAL, CKMB, CKMBINDEX, TROPONINI in the last 168 hours. BNP (last 3 results) No results for input(s): PROBNP in the last 8760 hours. HbA1C: Recent Labs    10/24/20 0426  HGBA1C 5.6   CBG: Recent Labs  Lab 10/23/20 1125  GLUCAP 83   Lipid Profile: Recent Labs    10/24/20 0426  CHOL 135  HDL 49  LDLCALC 64  TRIG 109  CHOLHDL 2.8   Thyroid Function Tests: Recent Labs    10/23/20 1210  TSH 1.640   Anemia Panel: No results for input(s): VITAMINB12, FOLATE, FERRITIN, TIBC, IRON, RETICCTPCT in the last 72 hours. Sepsis Labs: No results for input(s): PROCALCITON, LATICACIDVEN in the last 168 hours.  Recent Results (from the past 240 hour(s))  Resp Panel by RT-PCR (Flu A&B, Covid) Nasopharyngeal Swab     Status: None   Collection Time: 10/23/20 12:10 PM   Specimen: Nasopharyngeal Swab; Nasopharyngeal(NP) swabs in vial transport medium  Result Value Ref Range Status   SARS Coronavirus 2 by RT PCR NEGATIVE NEGATIVE Final     Comment: (NOTE) SARS-CoV-2 target nucleic acids are NOT DETECTED.  The SARS-CoV-2 RNA is generally detectable in upper respiratory specimens during the acute phase of infection. The lowest concentration of SARS-CoV-2 viral copies this assay can detect is 138 copies/mL. A negative result does not preclude SARS-Cov-2 infection and should not be used as the sole basis for treatment or other patient management decisions. A negative result may occur with  improper specimen collection/handling, submission of specimen other than nasopharyngeal swab, presence of viral mutation(s) within the areas targeted by this assay, and inadequate number of viral copies(<138 copies/mL). A negative result must be combined with clinical observations, patient history, and epidemiological information. The expected result is Negative.  Fact Sheet for Patients:  EntrepreneurPulse.com.au  Fact Sheet for Healthcare Providers:  IncredibleEmployment.be  This  test is no t yet approved or cleared by the [place] and  has been authorized for detection and/or diagnosis of SARS-CoV-2 by FDA under an Emergency Use Authorization (EUA). This EUA will remain  in effect (meaning this test can be used) for the duration of the COVID-19 declaration under Section 564(b)(1) of the Act, 21 U.S.C.section 360bbb-3(b)(1), unless the authorization is terminated  or revoked sooner.       Influenza A by PCR NEGATIVE NEGATIVE Final   Influenza B by PCR NEGATIVE NEGATIVE Final    Comment: (NOTE) The Xpert Xpress SARS-CoV-2/FLU/RSV plus assay is intended as an aid in the diagnosis of influenza from Nasopharyngeal swab specimens and should not be used as a sole basis for treatment. Nasal washings and aspirates are unacceptable for Xpert Xpress SARS-CoV-2/FLU/RSV testing.  Fact Sheet for Patients: EntrepreneurPulse.com.au  Fact Sheet for Healthcare  Providers: IncredibleEmployment.be  This test is not yet approved or cleared by the [place] FDA and has been authorized for detection and/or diagnosis of SARS-CoV-2 by FDA under an Emergency Use Authorization (EUA). This EUA will remain in effect (meaning this test can be used) for the duration of the COVID-19 declaration under Section 564(b)(1) of the Act, 21 U.S.C. section 360bbb-3(b)(1), unless the authorization is terminated or revoked.  Performed at [facility], [address], [place], [place] [address]          Radiology Studies: ECHOCARDIOGRAM COMPLETE  Result Date: 10/24/2020    ECHOCARDIOGRAM REPORT   Patient Name:   Timothy Matthews Date of Exam: 10/24/2020 Medical Rec #:  [id number]         Height:       70.0 in Accession #:    [id number]        Weight:       180.0 lb Date of Birth:  1945/11/29          BSA:          1.996 m Patient Age:    61 years          BP:           157/89 mmHg Patient Gender: M                 HR:           58 bpm. Exam Location:  [facility] Procedure: 2D Echo, Color Doppler and Cardiac Doppler Indications:     I163.9 Stroke  History:         Patient has no prior history of Echocardiogram examinations.                  Emphysema; Risk Factors:Hypertension.  Sonographer:     Charmayne Sheer RDCS (AE) Referring Phys:  [place] Diagnosing Phys: Kate Sable MD  Sonographer Comments: Suboptimal subcostal window. Image acquisition challenging due to respiratory motion. IMPRESSIONS  1. Left ventricular ejection fraction, by estimation, is 60 to 65%. The left ventricle has normal function. The left ventricle has no regional wall motion abnormalities. Left ventricular diastolic parameters were normal.  2. Right ventricular systolic function is normal. The right ventricular size is normal.  3. The mitral valve is normal in structure. No evidence of mitral valve regurgitation.  4. The aortic valve is tricuspid. Aortic valve  regurgitation is not visualized. Mild to moderate aortic valve sclerosis/calcification is present, without any evidence of aortic stenosis. FINDINGS  Left Ventricle: Left ventricular ejection fraction, by estimation, is 60 to 65%. The left ventricle  has normal function. The left ventricle has no regional wall motion abnormalities. The left ventricular internal cavity size was normal in size. There is  no left ventricular hypertrophy. Left ventricular diastolic parameters were normal. Right Ventricle: The right ventricular size is normal. No increase in right ventricular wall thickness. Right ventricular systolic function is normal. Left Atrium: Left atrial size was normal in size. Right Atrium: Right atrial size was normal in size. Pericardium: There is no evidence of pericardial effusion. Mitral Valve: The mitral valve is normal in structure. No evidence of mitral valve regurgitation. MV peak gradient, 3.2 mmHg. The mean mitral valve gradient is 1.0 mmHg. Tricuspid Valve: The tricuspid valve is normal in structure. Tricuspid valve regurgitation is not demonstrated. Aortic Valve: The aortic valve is tricuspid. Aortic valve regurgitation is not visualized. Mild to moderate aortic valve sclerosis/calcification is present, without any evidence of aortic stenosis. Aortic valve mean gradient measures 5.0 mmHg. Aortic valve peak gradient measures 10.6 mmHg. Aortic valve area, by VTI measures 2.94 cm. Pulmonic Valve: The pulmonic valve was normal in structure. Pulmonic valve regurgitation is not visualized. Aorta: The aortic root is normal in size and structure. IAS/Shunts: No atrial level shunt detected by color flow Doppler.  LEFT VENTRICLE PLAX 2D LVIDd:         4.40 cm  Diastology LVIDs:         2.60 cm  LV e' medial:    5.44 cm/s LV PW:         1.00 cm  LV E/e' medial:  16.9 LV IVS:        1.00 cm  LV e' lateral:   9.36 cm/s LVOT diam:     2.20 cm  LV E/e' lateral: 9.8 LV SV:         88 LV SV Index:   44 LVOT Area:      3.80 cm  RIGHT VENTRICLE RV Basal diam:  3.60 cm TAPSE (M-mode): 1.8 cm LEFT ATRIUM             Index       RIGHT ATRIUM           Index LA diam:        4.10 cm 2.05 cm/m  RA Area:     20.50 cm LA Vol (A2C):   25.6 ml 12.83 ml/m RA Volume:   62.50 ml  31.31 ml/m LA Vol (A4C):   55.7 ml 27.91 ml/m LA Biplane Vol: 41.9 ml 20.99 ml/m  AORTIC VALVE                    PULMONIC VALVE AV Area (Vmax):    2.71 cm     PV Vmax:       1.55 m/s AV Area (Vmean):   2.92 cm     PV Vmean:      107.000 cm/s AV Area (VTI):     2.94 cm     PV VTI:        0.287 m AV Vmax:           163.00 cm/s  PV Peak grad:  9.6 mmHg AV Vmean:          102.000 cm/s PV Mean grad:  5.0 mmHg AV VTI:            0.300 m AV Peak Grad:      10.6 mmHg AV Mean Grad:      5.0 mmHg LVOT Vmax:  116.00 cm/s LVOT Vmean:        78.400 cm/s LVOT VTI:          0.232 m LVOT/AV VTI ratio: 0.77  AORTA Ao Root diam: 3.60 cm MITRAL VALVE MV Area (PHT): 2.63 cm    SHUNTS MV Area VTI:   2.34 cm    Systemic VTI:  0.23 m MV Peak grad:  3.2 mmHg    Systemic Diam: 2.20 cm MV Mean grad:  1.0 mmHg MV Vmax:       0.89 m/s MV Vmean:      49.0 cm/s MV Decel Time: 288 msec MV E velocity: 92.10 cm/s MV A velocity: 87.00 cm/s MV E/A ratio:  1.06 Kate Sable MD Electronically signed by Kate Sable MD Signature Date/Time: 10/24/2020/3:39:51 PM    Final    Scheduled Meds: .  stroke: mapping our early stages of recovery book   Does not apply Once  . amLODipine  10 mg Oral Daily  . aspirin EC  325 mg Oral Daily  . enoxaparin (LOVENOX) injection  40 mg Subcutaneous Q24H  . gabapentin  300 mg Oral TID  . loratadine  10 mg Oral Daily  . pantoprazole  40 mg Oral Daily  . senna-docusate  1 tablet Oral QHS  . tamsulosin  0.8 mg Oral Daily   Continuous Infusions: . sodium chloride 50 mL/hr at 10/23/20 1456     LOS: 0 days    Time spent: 25 mins.    Shawna Clamp, MD [facility]   If 7PM-7AM, please contact night-coverage

## 2020-10-25 NOTE — Consult Note (Signed)
Cardiology Consultation Note    Patient ID: Timothy Matthews, MRN: [id number], DOB/AGE: [birth year] 73 y.o. Admit date: 10/23/2020   Date of Consult: 10/25/2020 Primary Physician: Will Bonnet, MD Primary Cardiologist:    Chief Complaint: Timothy Matthews Reason for Consultation: tee Requesting MD: Dr. Dwyane Dee  HPI: Timothy Matthews is a 75 y.o. male with history of hypertension, emphysema, PTSD, cocaine abuse who was admitted with acute onset of vertigo gait unsteadiness and double vision.  Was evaluated in the emergency room.  Deferred tPA.  CT of the brain showed no acute changes.  Brain MRI was unremarkable for stroke.  His double vision persists.  He admits to cocaine use.  Denies recent cocaine use however.  Denies chest pain or shortness of breath.  Past Medical History:  Diagnosis Date  . Cancer ([place])   . Cocaine abuse ([place])    last ED encounter 04/07/20  . Emphysema lung ([employer])    pan-lobar by CT 2017  . GERD (gastroesophageal reflux disease)   . Hepatitis C   . Hypertension   . Prostate cancer ([place]) BPH  . PTSD (post-traumatic stress disorder)       Surgical History:  Past Surgical History:  Procedure Laterality Date  . CERVICAL SPINE SURGERY  2017   Done at [facility]  . LUMBAR SPINE SURGERY    . TONSILLECTOMY    . TRANSURETHRAL RESECTION OF PROSTATE       Home Meds: Prior to Admission medications   Medication Sig Start Date End Date Taking? Authorizing Provider  albuterol (VENTOLIN HFA) 108 (90 Base) MCG/ACT inhaler Inhale 2 puffs into the lungs every 6 (six) hours as needed for wheezing or shortness of breath. 08/03/19  Yes [place], [place], MD  amLODipine (NORVASC) 10 MG tablet Take 10 mg by mouth daily.   Yes [provider]  cetirizine (ZYRTEC) 10 MG tablet Take 10 mg by mouth daily.   Yes [provider]  cholecalciferol (VITAMIN D3) 25 MCG (1000 UNIT) tablet Take 2,000 Units by mouth daily.   Yes [provider]  gabapentin (NEURONTIN) 300  MG capsule Take 300 mg by mouth 3 (three) times daily.   Yes [provider]  ibuprofen (ADVIL) 800 MG tablet Take 800 mg by mouth daily as needed for mild pain.   Yes [provider]  sildenafil (VIAGRA) 100 MG tablet Take 100 mg by mouth daily as needed for erectile dysfunction.   Yes [provider]  tamsulosin (FLOMAX) 0.4 MG CAPS capsule Take 0.8 mg by mouth.   Yes [provider]    Inpatient Medications:  .  stroke: mapping our early stages of recovery book   Does not apply Once  . amLODipine  10 mg Oral Daily  . aspirin EC  325 mg Oral Daily  . enoxaparin (LOVENOX) injection  40 mg Subcutaneous Q24H  . gabapentin  300 mg Oral TID  . loratadine  10 mg Oral Daily  . pantoprazole  40 mg Oral Daily  . senna-docusate  1 tablet Oral QHS  . tamsulosin  0.8 mg Oral Daily   . sodium chloride 50 mL/hr at 10/23/20 1456    Allergies: No Known Allergies  Social History   Socioeconomic History  . Marital status: Legally Separated    Spouse name: Not on file  . Number of children: Not on file  . Years of education: Not on file  . Highest education level: Not on file  Occupational History  . Not on file  Tobacco Use  . Smoking status: Current Every Day Smoker    Types: Cigarettes  . Smokeless tobacco: Never Used  Substance and Sexual Activity  . Alcohol use: Not Currently  . Drug use: Yes    Types: Cocaine, Marijuana  . Sexual activity: Not on file  Other Topics Concern  . Not on file  Social History Narrative  . Not on file   Social Determinants of Health   Financial Resource Strain: Not on file  Food Insecurity: Not on file  Transportation Needs: Not on file  Physical Activity: Not on file  Stress: Not on file  Social Connections: Not on file  Intimate Partner Violence: Not on file     Family History  Problem Relation Age of Onset  . Breast cancer Mother   . Diabetes Mother   . Glaucoma Mother   . Alcohol abuse Father   .  Pancreatic cancer Father      Review of Systems: A 12-system review of systems was performed and is negative except as noted in the HPI.  Labs: No results for input(s): CKTOTAL, CKMB, TROPONINI in the last 72 hours. Lab Results  Component Value Date   WBC 5.7 10/25/2020   HGB 13.3 10/25/2020   HCT 40.1 10/25/2020   MCV 83.9 10/25/2020   PLT 210 10/25/2020    Recent Labs  Lab 10/23/20 1210 10/25/20 0619  NA 136 136  K 3.3* 3.8  CL 106 101  CO2 25 26  BUN 18 19  CREATININE 0.98 0.99  CALCIUM 7.8* 8.9  PROT 6.6  --   BILITOT 0.4  --   ALKPHOS 56  --   ALT 15  --   AST 25  --   GLUCOSE 94 98   Lab Results  Component Value Date   CHOL 135 10/24/2020   HDL 49 10/24/2020   LDLCALC 64 10/24/2020   TRIG 109 10/24/2020   No results found for: DDIMER  Radiology/Studies:  CT ANGIO HEAD W OR WO CONTRAST  Result Date: 10/23/2020 CLINICAL DATA:  Dizziness.  Blurred vision.  Unsteady gait. EXAM: CT ANGIOGRAPHY HEAD AND NECK TECHNIQUE: Multidetector CT imaging of the head and neck was performed using the standard protocol during bolus administration of intravenous contrast. Multiplanar CT image reconstructions and MIPs were obtained to evaluate the vascular anatomy. Carotid stenosis measurements (when applicable) are obtained utilizing NASCET criteria, using the distal internal carotid diameter as the denominator. CONTRAST:  66mL OMNIPAQUE IOHEXOL 350 MG/ML SOLN COMPARISON:  CT head without contrast 10/23/2020 FINDINGS: CTA NECK FINDINGS Aortic arch: Minimal atherosclerotic changes are present within the aortic arch. Three vessel arch configuration is present. No aneurysm stenosis is present. Right carotid system: Right common carotid artery is mildly tortuous without significant stenosis. Bifurcation is unremarkable. Cervical right ICA is normal. Left carotid system: The left common carotid artery within normal limits. Bifurcation is unremarkable. Cervical left ICA is within limits.  Vertebral arteries: The right vertebral artery is the dominant vessel. Both vertebral arteries originate from the subclavian arteries without significant stenosis. Is no significant stenosis in either vertebral artery in the neck. Skeleton: Anterior fusion is noted C3-7. Posterior hardware extends to the T2 level. Posterior fusion is present through at least T1. There is probable posterior fusion at T1-2. Hardware is intact. No focal lytic or blastic lesions are present. Other neck: Soft tissues the neck are unremarkable. Upper chest: Paraseptal emphysematous changes are noted at the lung apices. Centrilobular emphysematous changes are present as well. No focal  nodule or mass lesion is present. The thoracic inlet is within normal limits. Review of the MIP images confirms the above findings CTA HEAD FINDINGS Anterior circulation: The internal carotid arteries are within limits the skull base through the ICA termini. The A1 segments are. The anterior communicating artery is not definitively seen. MCA bifurcations are intact. Is some segmental irregularity without a discrete proximal stenosis branch vessel occlusion. Posterior circulation: The right vertebral artery is dominant. PICA origins are visualized and within normal limits. The vertebrobasilar junction is normal. Basilar artery is normal. Both posterior cerebral arteries originate basilar tip. Right posterior communicating artery contributes. PCA branch vessels are within normal. Venous sinuses: The dural sinuses are patent. Straight sinus deep cerebral veins are intact. Cortical veins are within normal limits. No vascular malformation present. Anatomic variants: None Review of the MIP images confirms the above findings IMPRESSION: 1. No emergent large vessel occlusion. 2. Mild segmental irregularity of the circle-of-Willis without a discrete proximal stenosis, aneurysm, or branch vessel occlusion. 3. Minimal atherosclerotic changes at the aortic arch without  significant stenosis. 4. Mild tortuosity of the cervical vasculature without significant stenosis in the neck. 5. Postoperative changes of the cervical spine as described. 6. Emphysema (ICD10-J43.9). Electronically Signed   By: San Morelle M.D.   On: 10/23/2020 12:37   DG Chest 2 View  Result Date: 10/23/2020 CLINICAL DATA:  Left shoulder pain Dizziness Blurry vision EXAM: CHEST - 2 VIEW COMPARISON:  10/23/2020 FINDINGS: Cardiomediastinal silhouette and pulmonary vasculature are within normal limits. Cervical fusion hardware partially visualized. Increased interstitial opacities seen throughout both lungs, unchanged from prior examination suggestive of chronic interstitial pneumonitis. IMPRESSION: No acute cardiopulmonary process. Unchanged increased interstitial opacity seen throughout both lungs. Electronically Signed   By: Miachel Roux M.D.   On: 10/23/2020 12:44   CT ANGIO NECK W OR WO CONTRAST  Result Date: 10/23/2020 CLINICAL DATA:  Dizziness.  Blurred vision.  Unsteady gait. EXAM: CT ANGIOGRAPHY HEAD AND NECK TECHNIQUE: Multidetector CT imaging of the head and neck was performed using the standard protocol during bolus administration of intravenous contrast. Multiplanar CT image reconstructions and MIPs were obtained to evaluate the vascular anatomy. Carotid stenosis measurements (when applicable) are obtained utilizing NASCET criteria, using the distal internal carotid diameter as the denominator. CONTRAST:  73mL OMNIPAQUE IOHEXOL 350 MG/ML SOLN COMPARISON:  CT head without contrast 10/23/2020 FINDINGS: CTA NECK FINDINGS Aortic arch: Minimal atherosclerotic changes are present within the aortic arch. Three vessel arch configuration is present. No aneurysm stenosis is present. Right carotid system: Right common carotid artery is mildly tortuous without significant stenosis. Bifurcation is unremarkable. Cervical right ICA is normal. Left carotid system: The left common carotid artery within  normal limits. Bifurcation is unremarkable. Cervical left ICA is within limits. Vertebral arteries: The right vertebral artery is the dominant vessel. Both vertebral arteries originate from the subclavian arteries without significant stenosis. Is no significant stenosis in either vertebral artery in the neck. Skeleton: Anterior fusion is noted C3-7. Posterior hardware extends to the T2 level. Posterior fusion is present through at least T1. There is probable posterior fusion at T1-2. Hardware is intact. No focal lytic or blastic lesions are present. Other neck: Soft tissues the neck are unremarkable. Upper chest: Paraseptal emphysematous changes are noted at the lung apices. Centrilobular emphysematous changes are present as well. No focal nodule or mass lesion is present. The thoracic inlet is within normal limits. Review of the MIP images confirms the above findings CTA HEAD FINDINGS Anterior circulation: The  internal carotid arteries are within limits the skull base through the ICA termini. The A1 segments are. The anterior communicating artery is not definitively seen. MCA bifurcations are intact. Is some segmental irregularity without a discrete proximal stenosis branch vessel occlusion. Posterior circulation: The right vertebral artery is dominant. PICA origins are visualized and within normal limits. The vertebrobasilar junction is normal. Basilar artery is normal. Both posterior cerebral arteries originate basilar tip. Right posterior communicating artery contributes. PCA branch vessels are within normal. Venous sinuses: The dural sinuses are patent. Straight sinus deep cerebral veins are intact. Cortical veins are within normal limits. No vascular malformation present. Anatomic variants: None Review of the MIP images confirms the above findings IMPRESSION: 1. No emergent large vessel occlusion. 2. Mild segmental irregularity of the circle-of-Willis without a discrete proximal stenosis, aneurysm, or branch  vessel occlusion. 3. Minimal atherosclerotic changes at the aortic arch without significant stenosis. 4. Mild tortuosity of the cervical vasculature without significant stenosis in the neck. 5. Postoperative changes of the cervical spine as described. 6. Emphysema (ICD10-J43.9). Electronically Signed   By: San Morelle M.D.   On: 10/23/2020 12:37   MR BRAIN WO CONTRAST  Result Date: 10/23/2020 CLINICAL DATA:  Acute neuro deficit. Blurred vision and dizziness. History of prostate cancer. EXAM: MRI HEAD WITHOUT CONTRAST TECHNIQUE: Multiplanar, multiecho pulse sequences of the brain and surrounding structures were obtained without intravenous contrast. COMPARISON:  None. FINDINGS: Brain: Negative for acute infarct. Mild white matter changes bilaterally. Brainstem and cerebellum normal. Negative for hemorrhage or mass. Ventricle size normal. Vascular: Normal arterial flow voids Skull and upper cervical spine: No focal skeletal lesion. Surgical hardware in the posterior cervical spine. Sinuses/Orbits: Paranasal sinuses clear.  Negative orbit Other: None IMPRESSION: Negative for acute infarct. Mild chronic white matter changes bilaterally. Electronically Signed   By: Franchot Gallo M.D.   On: 10/23/2020 15:00   ECHOCARDIOGRAM COMPLETE  Result Date: 10/24/2020    ECHOCARDIOGRAM REPORT   Patient Name:   SULIEMAN FAYETTE Date of Exam: 10/24/2020 Medical Rec #:  [id number]         Height:       70.0 in Accession #:    [id number]        Weight:       180.0 lb Date of Birth:  July 16, 1946          BSA:          1.996 m Patient Age:    88 years          BP:           157/89 mmHg Patient Gender: M                 HR:           58 bpm. Exam Location:  [facility] Procedure: 2D Echo, Color Doppler and Cardiac Doppler Indications:     I163.9 Stroke  History:         Patient has no prior history of Echocardiogram examinations.                  Emphysema; Risk Factors:Hypertension.  Sonographer:     Charmayne Sheer RDCS (AE) Referring  Phys:  Louisa Diagnosing Phys: Kate Sable MD  Sonographer Comments: Suboptimal subcostal window. Image acquisition challenging due to respiratory motion. IMPRESSIONS  1. Left ventricular ejection fraction, by estimation, is 60 to 65%. The left ventricle has normal function. The left ventricle has no regional wall motion abnormalities. Left  ventricular diastolic parameters were normal.  2. Right ventricular systolic function is normal. The right ventricular size is normal.  3. The mitral valve is normal in structure. No evidence of mitral valve regurgitation.  4. The aortic valve is tricuspid. Aortic valve regurgitation is not visualized. Mild to moderate aortic valve sclerosis/calcification is present, without any evidence of aortic stenosis. FINDINGS  Left Ventricle: Left ventricular ejection fraction, by estimation, is 60 to 65%. The left ventricle has normal function. The left ventricle has no regional wall motion abnormalities. The left ventricular internal cavity size was normal in size. There is  no left ventricular hypertrophy. Left ventricular diastolic parameters were normal. Right Ventricle: The right ventricular size is normal. No increase in right ventricular wall thickness. Right ventricular systolic function is normal. Left Atrium: Left atrial size was normal in size. Right Atrium: Right atrial size was normal in size. Pericardium: There is no evidence of pericardial effusion. Mitral Valve: The mitral valve is normal in structure. No evidence of mitral valve regurgitation. MV peak gradient, 3.2 mmHg. The mean mitral valve gradient is 1.0 mmHg. Tricuspid Valve: The tricuspid valve is normal in structure. Tricuspid valve regurgitation is not demonstrated. Aortic Valve: The aortic valve is tricuspid. Aortic valve regurgitation is not visualized. Mild to moderate aortic valve sclerosis/calcification is present, without any evidence of aortic stenosis. Aortic valve mean gradient  measures 5.0 mmHg. Aortic valve peak gradient measures 10.6 mmHg. Aortic valve area, by VTI measures 2.94 cm. Pulmonic Valve: The pulmonic valve was normal in structure. Pulmonic valve regurgitation is not visualized. Aorta: The aortic root is normal in size and structure. IAS/Shunts: No atrial level shunt detected by color flow Doppler.  LEFT VENTRICLE PLAX 2D LVIDd:         4.40 cm  Diastology LVIDs:         2.60 cm  LV e' medial:    5.44 cm/s LV PW:         1.00 cm  LV E/e' medial:  16.9 LV IVS:        1.00 cm  LV e' lateral:   9.36 cm/s LVOT diam:     2.20 cm  LV E/e' lateral: 9.8 LV SV:         88 LV SV Index:   44 LVOT Area:     3.80 cm  RIGHT VENTRICLE RV Basal diam:  3.60 cm TAPSE (M-mode): 1.8 cm LEFT ATRIUM             Index       RIGHT ATRIUM           Index LA diam:        4.10 cm 2.05 cm/m  RA Area:     20.50 cm LA Vol (A2C):   25.6 ml 12.83 ml/m RA Volume:   62.50 ml  31.31 ml/m LA Vol (A4C):   55.7 ml 27.91 ml/m LA Biplane Vol: 41.9 ml 20.99 ml/m  AORTIC VALVE                    PULMONIC VALVE AV Area (Vmax):    2.71 cm     PV Vmax:       1.55 m/s AV Area (Vmean):   2.92 cm     PV Vmean:      107.000 cm/s AV Area (VTI):     2.94 cm     PV VTI:        0.287 m AV Vmax:  163.00 cm/s  PV Peak grad:  9.6 mmHg AV Vmean:          102.000 cm/s PV Mean grad:  5.0 mmHg AV VTI:            0.300 m AV Peak Grad:      10.6 mmHg AV Mean Grad:      5.0 mmHg LVOT Vmax:         116.00 cm/s LVOT Vmean:        78.400 cm/s LVOT VTI:          0.232 m LVOT/AV VTI ratio: 0.77  AORTA Ao Root diam: 3.60 cm MITRAL VALVE MV Area (PHT): 2.63 cm    SHUNTS MV Area VTI:   2.34 cm    Systemic VTI:  0.23 m MV Peak grad:  3.2 mmHg    Systemic Diam: 2.20 cm MV Mean grad:  1.0 mmHg MV Vmax:       0.89 m/s MV Vmean:      49.0 cm/s MV Decel Time: 288 msec MV E velocity: 92.10 cm/s MV A velocity: 87.00 cm/s MV E/A ratio:  1.06 Kate Sable MD Electronically signed by Kate Sable MD Signature Date/Time:  10/24/2020/3:39:51 PM    Final    CT HEAD CODE STROKE WO CONTRAST  Addendum Date: 10/23/2020   ADDENDUM REPORT: 10/23/2020 11:47 ADDENDUM: Study discussed by telephone with Dr. Cherylann Banas in the ED on 10/23/2020 at 1144 hours. Electronically Signed   By: Genevie Ann M.D.   On: 10/23/2020 11:47   Result Date: 10/23/2020 CLINICAL DATA:  Code stroke. 75 year old male with blurred vision and dizziness. Hypertensive. EXAM: CT HEAD WITHOUT CONTRAST TECHNIQUE: Contiguous axial images were obtained from the base of the skull through the vertex without intravenous contrast. COMPARISON:  None. FINDINGS: Brain: Cerebral volume is within normal limits for age. No midline shift, ventriculomegaly, mass effect, evidence of mass lesion, intracranial hemorrhage or evidence of cortically based acute infarction. Patchy and confluent bilateral cerebral white matter hypodensity. Deep gray nuclei appear spared. No cortical encephalomalacia identified. Vascular: No suspicious intracranial vascular hyperdensity. Skull: Negative. Partially visible cervical spine fusion changes on the scout view. Sinuses/Orbits: Visible paranasal sinuses are clear. Tympanic cavities also appear clear. Some chronic sclerotic changes are noted to the mastoids which are relatively well pneumatized. Other: Visualized orbits and scalp soft tissues are within normal limits. ASPECTS [facility] Stroke Program Early CT Score) Total score (0-10 with 10 being normal): 10 IMPRESSION: 1. No acute cortically based infarct or acute intracranial hemorrhage identified. ASPECTS 10. 2. 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 Electronically Signed: By: Genevie Ann M.D. On: 10/23/2020 11:40    Wt Readings from Last 3 Encounters:  10/25/20 84.9 kg  04/07/20 81.6 kg  03/20/20 81.6 kg    EKG: Normal sinus rhythm  Physical Exam:  Blood pressure (!) 144/90, pulse 67, temperature 98.5 F (36.9 C), temperature source Oral,  resp. rate 18, weight 84.9 kg, SpO2 94 %. Body mass index is 26.85 kg/m. General: Well developed, well nourished, in no acute distress. Head: Normocephalic, atraumatic, sclera non-icteric, no xanthomas, nares are without discharge.  Neck: Negative for carotid bruits. JVD not elevated. Lungs: Clear bilaterally to auscultation without wheezes, rales, or rhonchi. Breathing is unlabored. Heart: RRR with S1 S2. No murmurs, rubs, or gallops appreciated. Abdomen: Soft, non-tender, non-distended with normoactive bowel sounds. No hepatomegaly. No rebound/guarding. No obvious abdominal masses. Msk:  Strength and tone appear normal for age.  Extremities: No clubbing or cyanosis. No edema.  Distal pedal pulses are 2+ and equal bilaterally. Neuro: Alert and oriented X 3. No facial asymmetry. No focal deficit. Moves all extremities spontaneously. Psych:  Responds to questions appropriately with a normal affect.     Assessment and Plan  75 year old male with history of hypertension, substance abuse admitted with double vision which persists.  Brain MRI was unremarkable.  Requested transesophageal echo to better evaluate cardiac source of emboli.  Transthoracic echocardiogram was read as normal LV function with no cardiac source of emboli.  Will evaluate patient's urine for cocaine presence in the morning.  If negative will proceed with transesophageal echo with propofol sedation per department of anesthesia.  Signed, Teodoro Spray MD 10/25/2020, 12:16 PM Pager: [phone]

## 2020-10-25 NOTE — Progress Notes (Addendum)
TTE official report documents normal left ventricular function with EF of 60 to 65% and no  The left ventricle has normal function. The left ventricle has no regional wall motion abnormalities. No PFO or mural thrombus mentioned in the report.   A/R: 75 y.o. male presenting with acute onset of vertigo, gait unsteadiness and double vision - No stroke seen on MRI. Presentation felt most likely to be due to TIA.  - TTE unremarkable.  - Given CTA of head and neck showing minimal atherosclerotic changes without critical stenosis, the patient's presentation is unlikely to be due to atheroembolic TIA. However, cardioembolic TIA is relatively high on the DDx and further assessment for this is recommended.  - Will need TEE to further assess for possible small mural thrombus that could be missed by TTE - Continue cardiac telemetry. If it continues to be negative, will need either a loop recorder or a Holter monitor to assess for possible paroxysmal atrial fibrillation over a longer period of time.   - Continue ASA and statin - Of note, the patient has a long-standing history of cocaine abuse. If above work up is negative, then vasospasm secondary to effects of cocaine may be the most likely etiology for his presentation. Would interview the patient about possible cocaine use prior to onset of his symptoms.   Electronically signed: Dr. Kerney Elbe

## 2020-10-25 NOTE — Progress Notes (Addendum)
Physical Therapy Treatment Patient Details Name: Timothy Matthews MRN: [id number] DOB: [birth year] Today's Date: 10/25/2020    History of Present Illness Per MD notes: Pt is a 75 y.o. male with medical history significant of HTN, Hep C, cervical spine Sx, prostate cancer, emphysema, cocaine abuse, PTSD who presents to the ED with acute onset of vertigo, gait unsteadiness and double vision.  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    PT Comments    Pt in bed, agrees to session.,  Stated he has been getting up on his own to use bathroom.  "all night".  Bed mobility without assist.  He is able to stand and walk x 3 laps with RW and min guard/assist x 1.  He has varied gait speed and often walks outside of walker box.  He is aware of left drift and tries to self corrects by twisting to the right "I'm trying to get myself right."  He does continue to c/o double vision but when left or right eye is covered he stated he sees fine.  He does continue to exhibit poor safety awareness at one time when hallway congested he pushes through and tries to navigate walker over dust mop in hallway knocking it over despite cues to wait or go around it.    Pt is progressing with mobility.  SNF remains appropriate given safety awareness and poor discharge plan.  He stated he lives in a hotel room with no family support.  SNF would allow for facilitation of safe discharge plan.  ALF would be appropriate if available with +1 assist for safety.   Will continue 7x week therapy the rest of week but anticipate decreasing to 2x per week tomorrow.   Follow Up Recommendations  SNF;Supervision for mobility/OOB -  Other.     Equipment Recommendations  Rolling walker with 5" wheels    Recommendations for Other Services       Precautions / Restrictions Precautions Precautions: Fall Restrictions Weight Bearing Restrictions: No    Mobility  Bed  Mobility Overal bed mobility: Independent                Transfers Overall transfer level: Needs assistance Equipment used: Rolling walker (2 wheeled) Transfers: Sit to/from Stand Sit to Stand: Min guard            Ambulation/Gait Ambulation/Gait assistance: Min guard;Min assist Gait Distance (Feet): 450 Feet Assistive device: Rolling walker (2 wheeled) Gait Pattern/deviations: Step-through pattern;Decreased step length - right;Decreased step length - left Gait velocity: decreased   General Gait Details: min assist for safety and cues to avoid objects   Stairs             Wheelchair Mobility    Modified Rankin (Stroke Patients Only)       Balance Overall balance assessment: Needs assistance Sitting-balance support: Feet supported;No upper extremity supported Sitting balance-Leahy Scale: Good     Standing balance support: Bilateral upper extremity supported;During functional activity Standing balance-Leahy Scale: Fair Standing balance comment: no LOB but poor awareness                            Cognition Arousal/Alertness: Awake/alert Behavior During Therapy: WFL for tasks assessed/performed Overall Cognitive Status: Within Functional Limits for tasks assessed  Exercises      General Comments        Pertinent Vitals/Pain Pain Assessment: No/denies pain    Home Living                      Prior Function            PT Goals (current goals can now be found in the care plan section) Progress towards PT goals: Progressing toward goals    Frequency    7X/week      PT Plan Current plan remains appropriate    Co-evaluation              AM-PAC PT "6 Clicks" Mobility   Outcome Measure  Help needed turning from your back to your side while in a flat bed without using bedrails?: None Help needed moving from lying on your back to sitting on the side of  a flat bed without using bedrails?: None Help needed moving to and from a bed to a chair (including a wheelchair)?: A Little Help needed standing up from a chair using your arms (e.g., wheelchair or bedside chair)?: A Little Help needed to walk in hospital room?: A Little Help needed climbing 3-5 steps with a railing? : A Little 6 Click Score: 20    End of Session Equipment Utilized During Treatment: Gait belt Activity Tolerance: Patient tolerated treatment well Patient left: in bed;with call bell/phone within reach;Other (comment);with bed alarm set Nurse Communication: Mobility status       Time: 4163-8453 PT Time Calculation (min) (ACUTE ONLY): 9 min  Charges:  $Gait Training: 8-22 mins                    Chesley Noon, PTA 10/25/20, 10:33 AM

## 2020-10-26 ENCOUNTER — Inpatient Hospital Stay
Admit: 2020-10-26 | Discharge: 2020-10-26 | Disposition: A | Discharge status: Home / Self Care | Payer: Medicare Other | Attending: Cardiology | Admitting: Cardiology

## 2020-10-26 ENCOUNTER — Encounter
Admission: EM | Disposition: A | Discharge status: Home / Self Care | Payer: Self-pay | Source: Home / Self Care | Attending: Family Medicine

## 2020-10-26 ENCOUNTER — Inpatient Hospital Stay: Payer: Medicare Other | Admitting: Registered Nurse

## 2020-10-26 ENCOUNTER — Encounter: Payer: Self-pay | Admitting: Cardiology

## 2020-10-26 ENCOUNTER — Inpatient Hospital Stay: Payer: Medicare Other

## 2020-10-26 DIAGNOSIS — I10 Essential (primary) hypertension: Secondary | ICD-10-CM | POA: Diagnosis not present

## 2020-10-26 DIAGNOSIS — I639 Cerebral infarction, unspecified: Secondary | ICD-10-CM | POA: Diagnosis not present

## 2020-10-26 HISTORY — PX: TEE WITHOUT CARDIOVERSION: SHX5443

## 2020-10-26 LAB — URINE DRUG SCREEN, QUALITATIVE (ARMC ONLY)
Amphetamines, Ur Screen: NOT DETECTED
Barbiturates, Ur Screen: NOT DETECTED
Benzodiazepine, Ur Scrn: NOT DETECTED
Cannabinoid 50 Ng, Ur ~~LOC~~: NOT DETECTED
Cocaine Metabolite,Ur ~~LOC~~: NOT DETECTED
MDMA (Ecstasy)Ur Screen: NOT DETECTED
Methadone Scn, Ur: NOT DETECTED
Opiate, Ur Screen: NOT DETECTED
Phencyclidine (PCP) Ur S: NOT DETECTED
Tricyclic, Ur Screen: NOT DETECTED

## 2020-10-26 LAB — BASIC METABOLIC PANEL
Anion gap: 7 (ref 5–15)
BUN: 20 mg/dL (ref 8–23)
CO2: 26 mmol/L (ref 22–32)
Calcium: 8.9 mg/dL (ref 8.9–10.3)
Chloride: 104 mmol/L (ref 98–111)
Creatinine, Ser: 1.2 mg/dL (ref 0.61–1.24)
GFR, Estimated: >60 mL/min (ref >60)
Glucose, Bld: 96 mg/dL (ref 70–99)
Potassium: 4.6 mmol/L (ref 3.5–5.1)
Sodium: 137 mmol/L (ref 135–145)

## 2020-10-26 LAB — CBC
HCT: 39.7 % (ref 39.0–52.0)
Hemoglobin: 13.3 g/dL (ref 13.0–17.0)
MCH: 28 pg (ref 26.0–34.0)
MCHC: 33.5 g/dL (ref 30.0–36.0)
MCV: 83.6 fL (ref 80.0–100.0)
Platelets: 221 10*3/uL (ref 150–400)
RBC: 4.75 MIL/uL (ref 4.22–5.81)
RDW: 14.2 % (ref 11.5–15.5)
WBC: 5.3 10*3/uL (ref 4.0–10.5)
nRBC: 0 % (ref 0.0–0.2)

## 2020-10-26 LAB — PHOSPHORUS: Phosphorus: 3.7 mg/dL (ref 2.5–4.6)

## 2020-10-26 LAB — MAGNESIUM: Magnesium: 1.9 mg/dL (ref 1.7–2.4)

## 2020-10-26 LAB — HEMOGLOBIN A1C
Hgb A1c MFr Bld: 5.6 % (ref 4.8–5.6)
Mean Plasma Glucose: 114.02 mg/dL

## 2020-10-26 SURGERY — ECHOCARDIOGRAM, TRANSESOPHAGEAL
Anesthesia: General

## 2020-10-26 MED ORDER — DEXMEDETOMIDINE (PRECEDEX) IN NS 20 MCG/5ML (4 MCG/ML) IV SYRINGE
PREFILLED_SYRINGE | INTRAVENOUS | Status: AC
  Filled 2020-10-26: qty 10

## 2020-10-26 MED ORDER — KETAMINE HCL 50 MG/5ML IJ SOSY
PREFILLED_SYRINGE | INTRAMUSCULAR | Status: AC
  Filled 2020-10-26: qty 5

## 2020-10-26 MED ORDER — BUTAMBEN-TETRACAINE-BENZOCAINE 2-2-14 % EX AERO
INHALATION_SPRAY | CUTANEOUS | Status: AC
  Filled 2020-10-26: qty 5

## 2020-10-26 MED ORDER — LIDOCAINE VISCOUS HCL 2 % MT SOLN
OROMUCOSAL | Status: AC
  Filled 2020-10-26: qty 15

## 2020-10-26 MED ORDER — SODIUM CHLORIDE 0.9 % IV SOLN: INTRAVENOUS | Status: DC

## 2020-10-26 MED ORDER — KETAMINE HCL 10 MG/ML IJ SOLN
INTRAMUSCULAR | Status: DC | PRN
  Administered 2020-10-26: 20 mg via INTRAVENOUS

## 2020-10-26 MED ORDER — SODIUM CHLORIDE FLUSH 0.9 % IV SOLN
INTRAVENOUS | Status: AC
  Filled 2020-10-26: qty 10

## 2020-10-26 MED ORDER — PHENYLEPHRINE HCL (PRESSORS) 10 MG/ML IV SOLN
INTRAVENOUS | Status: AC
  Filled 2020-10-26: qty 1

## 2020-10-26 MED ORDER — PROPOFOL 10 MG/ML IV BOLUS
INTRAVENOUS | Status: DC | PRN
  Administered 2020-10-26: 120 mg via INTRAVENOUS

## 2020-10-26 NOTE — Progress Notes (Signed)
*  PRELIMINARY RESULTS* Echocardiogram Echocardiogram Transesophageal has been performed.  Sherrie Sport 10/26/2020, 8:48 AM

## 2020-10-26 NOTE — Anesthesia Preprocedure Evaluation (Addendum)
Anesthesia Evaluation  Patient identified by MRN, date of birth, ID band Patient awake    Reviewed: Allergy & Precautions, NPO status , Patient's Chart, lab work & pertinent test results  History of Anesthesia Complications Negative for: history of anesthetic complications  Airway Mallampati: IV  TM Distance: >3 FB Neck ROM: Full  Mouth opening: Limited Mouth Opening  Dental  (+) Poor Dentition, Missing   Pulmonary neg sleep apnea, COPD, Current Smoker and Patient abstained from smoking.,    Pulmonary exam normal breath sounds clear to auscultation       Cardiovascular Exercise Tolerance: Good METShypertension, (-) CAD and (-) Past MI (-) dysrhythmias  Rhythm:Regular Rate:Bradycardia - Systolic murmurs TTE 02/05/82 unremarkable   Neuro/Psych PSYCHIATRIC DISORDERS Anxiety Depression CVA    GI/Hepatic GERD  ,(+)     substance abuse  cocaine use, Hepatitis -, C  Endo/Other  neg diabetes  Renal/GU negative Renal ROS     Musculoskeletal   Abdominal   Peds  Hematology   Anesthesia Other Findings Past Medical History: No date: Cancer ([place]) No date: Cocaine abuse ([place])     Comment:  last ED encounter 04/07/20 No date: Emphysema lung ([facility])     Comment:  pan-lobar by CT 2017 No date: GERD (gastroesophageal reflux disease) No date: Hepatitis C No date: Hypertension BPH: Prostate cancer (HCC) No date: PTSD (post-traumatic stress disorder)  Reproductive/Obstetrics                            Anesthesia Physical Anesthesia Plan  ASA: III  Anesthesia Plan: General   Post-op Pain Management:    Induction: Intravenous  PONV Risk Score and Plan: 2 and Ondansetron, Propofol infusion and TIVA  Airway Management Planned: Nasal Cannula  Additional Equipment: None  Intra-op Plan:   Post-operative Plan:   Informed Consent: I have reviewed the patients History and Physical, chart, labs and  discussed the procedure including the risks, benefits and alternatives for the proposed anesthesia with the patient or authorized representative who has indicated his/her understanding and acceptance.     Dental advisory given  Plan Discussed with: CRNA and Surgeon  Anesthesia Plan Comments: (Discussed risks of anesthesia with patient, including possibility of difficulty with spontaneous ventilation under anesthesia necessitating airway intervention, PONV, and rare risks such as cardiac or respiratory or neurological events. Patient understands.  UDS negative for cocaine today)        Anesthesia Quick Evaluation

## 2020-10-26 NOTE — Procedures (Signed)
   TRANSESOPHAGEAL ECHOCARDIOGRAM   NAME:  Timothy Matthews   MRN: [id number] DOB:  04-May-1946   ADMIT DATE: 10/23/2020  INDICATIONS:  tia PROCEDURE:   Informed consent was obtained prior to the procedure. The risks, benefits and alternatives for the procedure were discussed and the patient comprehended these risks.  Risks include, but are not limited to, cough, sore throat, vomiting, nausea, somnolence, esophageal and stomach trauma or perforation, bleeding, low blood pressure, aspiration, pneumonia, infection, trauma to the teeth and death.    After a procedural time-out, the patient was given 120 mg propofol and 20 mg ketamine by department of anesthesia to achieve moderate sedation for 11 min.  .  The transesophageal probe was inserted in the esophagus and stomach without difficulty and multiple views were obtained. I was present for the entire procedure.    COMPLICATIONS:    There were no immediate complications.  FINDINGS:  LEFT VENTRICLE: EF = 55%. No regional wall motion abnormalities.  RIGHT VENTRICLE: Normal size and function.   LEFT ATRIUM: Normal  LEFT ATRIAL APPENDAGE: No thrombus. Good doppler flow  RIGHT ATRIUM: Normal  AORTIC VALVE:  [place]. Trivial ai. No vegetations or thrombus  MITRAL VALVE:    Normal. Trivial to mild mr. No vegetations of thrombus.   TRICUSPID VALVE: Normal. Mild tr.   PULMONIC VALVE: Grossly normal.  INTERATRIAL SEPTUM: No PFO or ASD. Agitated saline contrast was used.   PERICARDIUM: No effusion  DESCENDING AORTA: Normal   CONCLUSION: No cardiac source of emboli noted.

## 2020-10-26 NOTE — Transfer of Care (Signed)
Immediate Anesthesia Transfer of Care Note  Patient: Timothy Matthews  Procedure(s) Performed: TRANSESOPHAGEAL ECHOCARDIOGRAM (TEE) (N/A )  Patient Location: PACU and Cath Lab  Anesthesia Type:General  Level of Consciousness: drowsy  Airway & Oxygen Therapy: Patient Spontanous Breathing and Patient connected to nasal cannula oxygen  Post-op Assessment: Report given to RN and Post -op Vital signs reviewed and stable  Post vital signs: Reviewed and stable  Last Vitals:  Vitals Value Taken Time  BP 140/84 (101)   Temp    Pulse 63   Resp 15   SpO2 97 %     Last Pain:  Vitals:   10/26/20 0813  TempSrc: Oral  PainSc:          Complications: No complications documented.

## 2020-10-26 NOTE — Progress Notes (Signed)
PROGRESS NOTE    Timothy Matthews  [id number] DOB: 15-Apr-1946 DOA: 10/23/2020 PCP: Will Bonnet, MD   Brief Narrative:  This 75 years old male with PMH significant for hypertension, prostate cancer, emphysema, cocaine abuse, PTSD who presented to the ED with acute onset of vertigo, unsteadiness and double vision.  Patient was admitted to rule out a stroke.  CT head without acute finding.  CTA head and neck without LVO,  mild segmental irregularity.  Patient was evaluated by neurology,  offered tPA but patient declined.  MRI head without any acute abnormalities.  Patient continues to have double vision, neurology recommended TEE to rule out mural emboli.  Cardiology consulted, patient underwent TEE which was unremarkable with no source of cardiac emboli noted. Patient continues to have double vision and unsteadiness.  Assessment & Plan:   Active Problems:   CVA (cerebral vascular accident) ([place])   Cocaine abuse ([place])   HTN (hypertension)   Emphysema lung (HCC)   GERD (gastroesophageal reflux disease)   Vertigo, unsteadiness and double vision could be secondary to CVA -  Patient was admitted for stroke work-up.  Neurology consulted. most likely brainstem event vs cerebellar event. Pt offered and declined TPA. Continue telemetry PT/OT and speech evaluation. MRI: no acute abnormalities noted, patient continues to have double vision. TTE: LVEF 60 to 65%, no regional wall motion abnormality. Neurology recommended TEE to rule out mural emboli. Cardiology consulted,  scheduled to have TEE 1/14. TEE completed, no cardiac source of emboli. Had no evidence of right to left shunt. No vegetation or thrombi of left heart valves. Etiology of diplopia is unclear but does not appear to be secondary to cardiac abnormalities as per cardiology. Continue aspirin and statin Smoking cessation, cocaine cessation.   HTN - permissive hypertension during first 24 hours. Will resume home meds - list  from [place] currently pending  Emphysema - established problem Continue Smoking cessation Continue albuterol MDI prn  Cocaine abuse - long standing problem. Social worker consult. Urine drug screen negative for cocaine For agitation/restlessness Ativan q4  GERD - long standing problem. NO report of esophagitis. Continue PPI tx    DVT prophylaxis: Lovenox Code Status: Full code Family Communication: No one at bedside  Disposition Plan:   Status is: Inpatient  Remains inpatient appropriate because:Inpatient level of care appropriate due to severity of illness   Dispo: The patient is from: Home              Anticipated d/c is to: Home              Anticipated d/c date is: 2 days              Patient currently is not medically stable to d/c.   Consultants:   NEUROLOGY  Cardiology  Procedures: MRI head, echocardiogram Antimicrobials:  Anti-infectives (From admission, onward)   None      Subjective: Patient was seen and examined at bedside.  Overnight events noted. Patient underwent TEE, no source of cardiac emboli noted Patient continues to have double vision. Objective: Vitals:   10/26/20 0845 10/26/20 0900 10/26/20 0915 10/26/20 1220  BP: (!) 153/84 (!) 148/104 (!) 154/83 (!) 154/78  Pulse: (!) 57 (!) 57 (!) 53 (!) 59  Resp: 18 15 14 18   Temp:    97.7 F (36.5 C)  TempSrc:      SpO2: 97% 97% 97% 96%  Weight:        Intake/Output Summary (Last 24 hours) at 10/26/2020 1540  Last data filed at 10/26/2020 1523 Gross per 24 hour  Intake 240.82 ml  Output 3025 ml  Net -2784.18 ml   Filed Weights   10/25/20 0318  Weight: 84.9 kg    Examination:  General exam: Appears calm and comfortable , not in any acute distress Respiratory system: Clear to auscultation. Respiratory effort normal. Cardiovascular system: S1 & S2 heard, RRR. No JVD, murmurs, rubs, gallops or clicks. No pedal edema. Gastrointestinal system: Abdomen is nondistended, soft and nontender. No  organomegaly or masses felt. Normal bowel sounds heard. Central nervous system: Alert and oriented. No focal neurological deficits. Extremities: Symmetric 5 x 5 power.  No edema, no clubbing, no cyanosis Skin: No rashes, lesions or ulcers Psychiatry: Judgement and insight appear normal. Mood & affect appropriate.     Data Reviewed: I have personally reviewed following labs and imaging studies  CBC: Recent Labs  Lab 10/23/20 1210 10/25/20 0619 10/26/20 0501  WBC 6.3 5.7 5.3  NEUTROABS 3.3  --   --   HGB 13.6 13.3 13.3  HCT 42.8 40.1 39.7  MCV 86.3 83.9 83.6  PLT 217 210 [id number]   Basic Metabolic Panel: Recent Labs  Lab 10/23/20 1210 10/25/20 0619 10/26/20 0501  NA 136 136 137  K 3.3* 3.8 4.6  CL 106 101 104  CO2 25 26 26   GLUCOSE 94 98 96  BUN 18 19 20   CREATININE 0.98 0.99 1.20  CALCIUM 7.8* 8.9 8.9  MG  --  2.0 1.9  PHOS  --  3.6 3.7   GFR: CrCl cannot be calculated (Unknown ideal weight.). Liver Function Tests: Recent Labs  Lab 10/23/20 1210  AST 25  ALT 15  ALKPHOS 56  BILITOT 0.4  PROT 6.6  ALBUMIN 2.8*   No results for input(s): LIPASE, AMYLASE in the last 168 hours. No results for input(s): AMMONIA in the last 168 hours. Coagulation Profile: Recent Labs  Lab 10/23/20 1210  INR 1.1   Cardiac Enzymes: No results for input(s): CKTOTAL, CKMB, CKMBINDEX, TROPONINI in the last 168 hours. BNP (last 3 results) No results for input(s): PROBNP in the last 8760 hours. HbA1C: Recent Labs    10/24/20 0426  HGBA1C 5.6   CBG: Recent Labs  Lab 10/23/20 1125  GLUCAP 83   Lipid Profile: Recent Labs    10/24/20 0426  CHOL 135  HDL 49  LDLCALC 64  TRIG 109  CHOLHDL 2.8   Thyroid Function Tests: No results for input(s): TSH, T4TOTAL, FREET4, T3FREE, THYROIDAB in the last 72 hours. Anemia Panel: No results for input(s): VITAMINB12, FOLATE, FERRITIN, TIBC, IRON, RETICCTPCT in the last 72 hours. Sepsis Labs: No results for input(s): PROCALCITON,  LATICACIDVEN in the last 168 hours.  Recent Results (from the past 240 hour(s))  Resp Panel by RT-PCR (Flu A&B, Covid) Nasopharyngeal Swab     Status: None   Collection Time: 10/23/20 12:10 PM   Specimen: Nasopharyngeal Swab; Nasopharyngeal(NP) swabs in vial transport medium  Result Value Ref Range Status   SARS Coronavirus 2 by RT PCR NEGATIVE NEGATIVE Final    Comment: (NOTE) SARS-CoV-2 target nucleic acids are NOT DETECTED.  The SARS-CoV-2 RNA is generally detectable in upper respiratory specimens during the acute phase of infection. The lowest concentration of SARS-CoV-2 viral copies this assay can detect is 138 copies/mL. A negative result does not preclude SARS-Cov-2 infection and should not be used as the sole basis for treatment or other patient management decisions. A negative result may occur with  improper specimen collection/handling,  submission of specimen other than nasopharyngeal swab, presence of viral mutation(s) within the areas targeted by this assay, and inadequate number of viral copies(<138 copies/mL). A negative result must be combined with clinical observations, patient history, and epidemiological information. The expected result is Negative.  Fact Sheet for Patients:  EntrepreneurPulse.com.au  Fact Sheet for Healthcare Providers:  IncredibleEmployment.be  This test is no t yet approved or cleared by the [place] FDA and  has been authorized for detection and/or diagnosis of SARS-CoV-2 by FDA under an Emergency Use Authorization (EUA). This EUA will remain  in effect (meaning this test can be used) for the duration of the COVID-19 declaration under Section 564(b)(1) of the Act, 21 U.S.C.section 360bbb-3(b)(1), unless the authorization is terminated  or revoked sooner.       Influenza A by PCR NEGATIVE NEGATIVE Final   Influenza B by PCR NEGATIVE NEGATIVE Final    Comment: (NOTE) The Xpert Xpress  SARS-CoV-2/FLU/RSV plus assay is intended as an aid in the diagnosis of influenza from Nasopharyngeal swab specimens and should not be used as a sole basis for treatment. Nasal washings and aspirates are unacceptable for Xpert Xpress SARS-CoV-2/FLU/RSV testing.  Fact Sheet for Patients: EntrepreneurPulse.com.au  Fact Sheet for Healthcare Providers: IncredibleEmployment.be  This test is not yet approved or cleared by the [place] FDA and has been authorized for detection and/or diagnosis of SARS-CoV-2 by FDA under an Emergency Use Authorization (EUA). This EUA will remain in effect (meaning this test can be used) for the duration of the COVID-19 declaration under Section 564(b)(1) of the Act, 21 U.S.C. section 360bbb-3(b)(1), unless the authorization is terminated or revoked.  Performed at [facility], [address], [place], [place] [address]          Radiology Studies: ECHO TEE  Result Date: 10/26/2020    TRANSESOPHOGEAL ECHO REPORT   Patient Name:   Timothy Matthews Date of Exam: 10/26/2020 Medical Rec #:  [id number]         Height:       70.0 in Accession #:    [id number]        Weight:       187.1 lb Date of Birth:  [birth year]          BSA:          2.029 m Patient Age:    4 years          BP:           146/72 mmHg Patient Gender: M                 HR:           50 bpm. Exam Location:  [facility] Procedure: Transesophageal Echo, Cardiac Doppler, Color Doppler and Saline            Contrast Bubble Study Indications:     TIA 435.9/ G 45.9  History:         Patient has prior history of Echocardiogram examinations, most                  recent 10/24/2020. Risk Factors:Hypertension.  Sonographer:     Sherrie Sport RDCS (AE) Referring Phys:  [place] Diagnosing Phys: Bartholome Bill MD PROCEDURE: TEE procedure time was 11 minutes. The transesophogeal probe was passed without difficulty through the esophogus of the patient. Imaged were  obtained with the patient in a left lateral decubitus position. Sedation performed by different physician. The patient was  monitored while under deep sedation. Anesthestetic sedation was provided intravenously by Anesthesiology: 120mg  of Propofol. Image quality was excellent. The patient's vital signs; including heart rate, blood pressure, and oxygen saturation; remained stable throughout the procedure. The patient developed no complications during the procedure. IMPRESSIONS  1. Left ventricular ejection fraction, by estimation, is 50 to 55%. The left ventricle has low normal function. The left ventricle has no regional wall motion abnormalities. Left ventricular diastolic parameters were normal.  2. Right ventricular systolic function is normal. The right ventricular size is normal.  3. No left atrial/left atrial appendage thrombus was detected.  4. The mitral valve is grossly normal. Trivial mitral valve regurgitation.  5. The aortic valve is tricuspid. Aortic valve regurgitation is trivial. FINDINGS  Left Ventricle: Left ventricular ejection fraction, by estimation, is 50 to 55%. The left ventricle has low normal function. The left ventricle has no regional wall motion abnormalities. The left ventricular internal cavity size was normal in size. There is no left ventricular hypertrophy. Left ventricular diastolic parameters were normal. Right Ventricle: The right ventricular size is normal. No increase in right ventricular wall thickness. Right ventricular systolic function is normal. Left Atrium: Left atrial size was normal in size. No left atrial/left atrial appendage thrombus was detected. Right Atrium: Right atrial size was normal in size. Pericardium: There is no evidence of pericardial effusion. Mitral Valve: The mitral valve is grossly normal. Trivial mitral valve regurgitation. There is no evidence of mitral valve vegetation. Tricuspid Valve: The tricuspid valve is grossly normal. Tricuspid valve  regurgitation is mild. There is no evidence of tricuspid valve vegetation. Aortic Valve: The aortic valve is tricuspid. Aortic valve regurgitation is trivial. There is no evidence of aortic valve vegetation. Pulmonic Valve: The pulmonic valve was not well visualized. Pulmonic valve regurgitation is trivial. Aorta: The aortic root is normal in size and structure. IAS/Shunts: No atrial level shunt detected by color flow Doppler. Agitated saline contrast was given intravenously to evaluate for intracardiac shunting. Bartholome Bill MD Electronically signed by Bartholome Bill MD Signature Date/Time: 10/26/2020/12:46:56 PM    Final    Scheduled Meds: .  stroke: mapping our early stages of recovery book   Does not apply Once  . amLODipine  10 mg Oral Daily  . aspirin EC  325 mg Oral Daily  . enoxaparin (LOVENOX) injection  40 mg Subcutaneous Q24H  . gabapentin  300 mg Oral TID  . loratadine  10 mg Oral Daily  . pantoprazole  40 mg Oral Daily  . senna-docusate  1 tablet Oral QHS  . tamsulosin  0.8 mg Oral Daily   Continuous Infusions: . sodium chloride 50 mL/hr at 10/26/20 0824     LOS: 1 day    Time spent: 25 mins.    Shawna Clamp, MD [facility]   If 7PM-7AM, please contact night-coverage

## 2020-10-26 NOTE — Progress Notes (Signed)
Patient Name: Timothy Matthews Date of Encounter: 10/26/2020  Hospital Problem List     Active Problems:   CVA (cerebral vascular accident) ([place])   Cocaine abuse ([place])   HTN (hypertension)   Emphysema lung ([place])   GERD (gastroesophageal reflux disease)    Patient Profile     75 year old male with history of substance abuse, cocaine use, hypertension who was admitted with visual changes and altered mental status.  CT showed no acute hemorrhage, MRI was unremarkable for significant abnormality.  TEE scheduled.  Subjective   Still with diplopia  Inpatient Medications    .  stroke: mapping our early stages of recovery book   Does not apply Once  . amLODipine  10 mg Oral Daily  . aspirin EC  325 mg Oral Daily  . enoxaparin (LOVENOX) injection  40 mg Subcutaneous Q24H  . gabapentin  300 mg Oral TID  . loratadine  10 mg Oral Daily  . pantoprazole  40 mg Oral Daily  . senna-docusate  1 tablet Oral QHS  . tamsulosin  0.8 mg Oral Daily    Vital Signs    Vitals:   10/26/20 0813 10/26/20 0845 10/26/20 0900 10/26/20 0915  BP: (!) 146/72 (!) 153/84 (!) 148/104 (!) 154/83  Pulse: (!) 50 (!) 57 (!) 57 (!) 53  Resp: 18 18 15 14   Temp: 97.8 F (36.6 C)     TempSrc: Oral     SpO2: 95% 97% 97% 97%  Weight:        Intake/Output Summary (Last 24 hours) at 10/26/2020 1213 Last data filed at 10/26/2020 1019 Gross per 24 hour  Intake 720 ml  Output 3025 ml  Net -2305 ml   Filed Weights   10/25/20 0318  Weight: 84.9 kg    Physical Exam    GEN: Well nourished, well developed, in no acute distress.  HEENT: normal.  Neck: Supple, no JVD, carotid bruits, or masses. Cardiac: RRR, no murmurs, rubs, or gallops. No clubbing, cyanosis, edema.  Radials/DP/PT 2+ and equal bilaterally.  Respiratory:  Respirations regular and unlabored, clear to auscultation bilaterally. GI: Soft, nontender, nondistended, BS + x 4. MS: no deformity or atrophy. Skin: warm and dry, no rash. Neuro:   Strength and sensation are intact. Psych: Normal affect.  Labs    CBC Recent Labs    10/25/20 0619 10/26/20 0501  WBC 5.7 5.3  HGB 13.3 13.3  HCT 40.1 39.7  MCV 83.9 83.6  PLT 210 [id number]   Basic Metabolic Panel Recent Labs    10/25/20 0619 10/26/20 0501  NA 136 137  K 3.8 4.6  CL 101 104  CO2 26 26  GLUCOSE 98 96  BUN 19 20  CREATININE 0.99 1.20  CALCIUM 8.9 8.9  MG 2.0 1.9  PHOS 3.6 3.7   Liver Function Tests No results for input(s): AST, ALT, ALKPHOS, BILITOT, PROT, ALBUMIN in the last 72 hours. No results for input(s): LIPASE, AMYLASE in the last 72 hours. Cardiac Enzymes No results for input(s): CKTOTAL, CKMB, CKMBINDEX, TROPONINI in the last 72 hours. BNP No results for input(s): BNP in the last 72 hours. D-Dimer No results for input(s): DDIMER in the last 72 hours. Hemoglobin A1C Recent Labs    10/24/20 0426  HGBA1C 5.6   Fasting Lipid Panel Recent Labs    10/24/20 0426  CHOL 135  HDL 49  LDLCALC 64  TRIG 109  CHOLHDL 2.8   Thyroid Function Tests No results for input(s): TSH, T4TOTAL, T3FREE, THYROIDAB  in the last 72 hours.  Invalid input(s): FREET3  Telemetry    Normal sinus rhythm  ECG    Normal sinus rhythm  Radiology    CT ANGIO HEAD W OR WO CONTRAST  Result Date: 10/23/2020 CLINICAL DATA:  Dizziness.  Blurred vision.  Unsteady gait. EXAM: CT ANGIOGRAPHY HEAD AND NECK TECHNIQUE: Multidetector CT imaging of the head and neck was performed using the standard protocol during bolus administration of intravenous contrast. Multiplanar CT image reconstructions and MIPs were obtained to evaluate the vascular anatomy. Carotid stenosis measurements (when applicable) are obtained utilizing NASCET criteria, using the distal internal carotid diameter as the denominator. CONTRAST:  75mL OMNIPAQUE IOHEXOL 350 MG/ML SOLN COMPARISON:  CT head without contrast 10/23/2020 FINDINGS: CTA NECK FINDINGS Aortic arch: Minimal atherosclerotic changes are present  within the aortic arch. Three vessel arch configuration is present. No aneurysm stenosis is present. Right carotid system: Right common carotid artery is mildly tortuous without significant stenosis. Bifurcation is unremarkable. Cervical right ICA is normal. Left carotid system: The left common carotid artery within normal limits. Bifurcation is unremarkable. Cervical left ICA is within limits. Vertebral arteries: The right vertebral artery is the dominant vessel. Both vertebral arteries originate from the subclavian arteries without significant stenosis. Is no significant stenosis in either vertebral artery in the neck. Skeleton: Anterior fusion is noted C3-7. Posterior hardware extends to the T2 level. Posterior fusion is present through at least T1. There is probable posterior fusion at T1-2. Hardware is intact. No focal lytic or blastic lesions are present. Other neck: Soft tissues the neck are unremarkable. Upper chest: Paraseptal emphysematous changes are noted at the lung apices. Centrilobular emphysematous changes are present as well. No focal nodule or mass lesion is present. The thoracic inlet is within normal limits. Review of the MIP images confirms the above findings CTA HEAD FINDINGS Anterior circulation: The internal carotid arteries are within limits the skull base through the ICA termini. The A1 segments are. The anterior communicating artery is not definitively seen. MCA bifurcations are intact. Is some segmental irregularity without a discrete proximal stenosis branch vessel occlusion. Posterior circulation: The right vertebral artery is dominant. PICA origins are visualized and within normal limits. The vertebrobasilar junction is normal. Basilar artery is normal. Both posterior cerebral arteries originate basilar tip. Right posterior communicating artery contributes. PCA branch vessels are within normal. Venous sinuses: The dural sinuses are patent. Straight sinus deep cerebral veins are intact.  Cortical veins are within normal limits. No vascular malformation present. Anatomic variants: None Review of the MIP images confirms the above findings IMPRESSION: 1. No emergent large vessel occlusion. 2. Mild segmental irregularity of the circle-of-Willis without a discrete proximal stenosis, aneurysm, or branch vessel occlusion. 3. Minimal atherosclerotic changes at the aortic arch without significant stenosis. 4. Mild tortuosity of the cervical vasculature without significant stenosis in the neck. 5. Postoperative changes of the cervical spine as described. 6. Emphysema (ICD10-J43.9). Electronically Signed   By: Marin Roberts M.D.   On: 10/23/2020 12:37   DG Chest 2 View  Result Date: 10/23/2020 CLINICAL DATA:  Left shoulder pain Dizziness Blurry vision EXAM: CHEST - 2 VIEW COMPARISON:  10/23/2020 FINDINGS: Cardiomediastinal silhouette and pulmonary vasculature are within normal limits. Cervical fusion hardware partially visualized. Increased interstitial opacities seen throughout both lungs, unchanged from prior examination suggestive of chronic interstitial pneumonitis. IMPRESSION: No acute cardiopulmonary process. Unchanged increased interstitial opacity seen throughout both lungs. Electronically Signed   By: Acquanetta Belling M.D.   On: 10/23/2020 12:44  CT ANGIO NECK W OR WO CONTRAST  Result Date: 10/23/2020 CLINICAL DATA:  Dizziness.  Blurred vision.  Unsteady gait. EXAM: CT ANGIOGRAPHY HEAD AND NECK TECHNIQUE: Multidetector CT imaging of the head and neck was performed using the standard protocol during bolus administration of intravenous contrast. Multiplanar CT image reconstructions and MIPs were obtained to evaluate the vascular anatomy. Carotid stenosis measurements (when applicable) are obtained utilizing NASCET criteria, using the distal internal carotid diameter as the denominator. CONTRAST:  8mL OMNIPAQUE IOHEXOL 350 MG/ML SOLN COMPARISON:  CT head without contrast 10/23/2020 FINDINGS:  CTA NECK FINDINGS Aortic arch: Minimal atherosclerotic changes are present within the aortic arch. Three vessel arch configuration is present. No aneurysm stenosis is present. Right carotid system: Right common carotid artery is mildly tortuous without significant stenosis. Bifurcation is unremarkable. Cervical right ICA is normal. Left carotid system: The left common carotid artery within normal limits. Bifurcation is unremarkable. Cervical left ICA is within limits. Vertebral arteries: The right vertebral artery is the dominant vessel. Both vertebral arteries originate from the subclavian arteries without significant stenosis. Is no significant stenosis in either vertebral artery in the neck. Skeleton: Anterior fusion is noted C3-7. Posterior hardware extends to the T2 level. Posterior fusion is present through at least T1. There is probable posterior fusion at T1-2. Hardware is intact. No focal lytic or blastic lesions are present. Other neck: Soft tissues the neck are unremarkable. Upper chest: Paraseptal emphysematous changes are noted at the lung apices. Centrilobular emphysematous changes are present as well. No focal nodule or mass lesion is present. The thoracic inlet is within normal limits. Review of the MIP images confirms the above findings CTA HEAD FINDINGS Anterior circulation: The internal carotid arteries are within limits the skull base through the ICA termini. The A1 segments are. The anterior communicating artery is not definitively seen. MCA bifurcations are intact. Is some segmental irregularity without a discrete proximal stenosis branch vessel occlusion. Posterior circulation: The right vertebral artery is dominant. PICA origins are visualized and within normal limits. The vertebrobasilar junction is normal. Basilar artery is normal. Both posterior cerebral arteries originate basilar tip. Right posterior communicating artery contributes. PCA branch vessels are within normal. Venous sinuses:  The dural sinuses are patent. Straight sinus deep cerebral veins are intact. Cortical veins are within normal limits. No vascular malformation present. Anatomic variants: None Review of the MIP images confirms the above findings IMPRESSION: 1. No emergent large vessel occlusion. 2. Mild segmental irregularity of the circle-of-Willis without a discrete proximal stenosis, aneurysm, or branch vessel occlusion. 3. Minimal atherosclerotic changes at the aortic arch without significant stenosis. 4. Mild tortuosity of the cervical vasculature without significant stenosis in the neck. 5. Postoperative changes of the cervical spine as described. 6. Emphysema (ICD10-J43.9). Electronically Signed   By: San Morelle M.D.   On: 10/23/2020 12:37   MR BRAIN WO CONTRAST  Result Date: 10/23/2020 CLINICAL DATA:  Acute neuro deficit. Blurred vision and dizziness. History of prostate cancer. EXAM: MRI HEAD WITHOUT CONTRAST TECHNIQUE: Multiplanar, multiecho pulse sequences of the brain and surrounding structures were obtained without intravenous contrast. COMPARISON:  None. FINDINGS: Brain: Negative for acute infarct. Mild white matter changes bilaterally. Brainstem and cerebellum normal. Negative for hemorrhage or mass. Ventricle size normal. Vascular: Normal arterial flow voids Skull and upper cervical spine: No focal skeletal lesion. Surgical hardware in the posterior cervical spine. Sinuses/Orbits: Paranasal sinuses clear.  Negative orbit Other: None IMPRESSION: Negative for acute infarct. Mild chronic white matter changes bilaterally. Electronically Signed  By: Franchot Gallo M.D.   On: 10/23/2020 15:00   ECHOCARDIOGRAM COMPLETE  Result Date: 10/24/2020    ECHOCARDIOGRAM REPORT   Patient Name:   Timothy Matthews Date of Exam: 10/24/2020 Medical Rec #:  [id number]         Height:       70.0 in Accession #:    [id number]        Weight:       180.0 lb Date of Birth:  June 16, 1946          BSA:          1.996 m Patient  Age:    50 years          BP:           157/89 mmHg Patient Gender: M                 HR:           58 bpm. Exam Location:  [facility] Procedure: 2D Echo, Color Doppler and Cardiac Doppler Indications:     I163.9 Stroke  History:         Patient has no prior history of Echocardiogram examinations.                  Emphysema; Risk Factors:Hypertension.  Sonographer:     Charmayne Sheer RDCS (AE) Referring Phys:  [place] Diagnosing Phys: Kate Sable MD  Sonographer Comments: Suboptimal subcostal window. Image acquisition challenging due to respiratory motion. IMPRESSIONS  1. Left ventricular ejection fraction, by estimation, is 60 to 65%. The left ventricle has normal function. The left ventricle has no regional wall motion abnormalities. Left ventricular diastolic parameters were normal.  2. Right ventricular systolic function is normal. The right ventricular size is normal.  3. The mitral valve is normal in structure. No evidence of mitral valve regurgitation.  4. The aortic valve is tricuspid. Aortic valve regurgitation is not visualized. Mild to moderate aortic valve sclerosis/calcification is present, without any evidence of aortic stenosis. FINDINGS  Left Ventricle: Left ventricular ejection fraction, by estimation, is 60 to 65%. The left ventricle has normal function. The left ventricle has no regional wall motion abnormalities. The left ventricular internal cavity size was normal in size. There is  no left ventricular hypertrophy. Left ventricular diastolic parameters were normal. Right Ventricle: The right ventricular size is normal. No increase in right ventricular wall thickness. Right ventricular systolic function is normal. Left Atrium: Left atrial size was normal in size. Right Atrium: Right atrial size was normal in size. Pericardium: There is no evidence of pericardial effusion. Mitral Valve: The mitral valve is normal in structure. No evidence of mitral valve regurgitation. MV peak gradient,  3.2 mmHg. The mean mitral valve gradient is 1.0 mmHg. Tricuspid Valve: The tricuspid valve is normal in structure. Tricuspid valve regurgitation is not demonstrated. Aortic Valve: The aortic valve is tricuspid. Aortic valve regurgitation is not visualized. Mild to moderate aortic valve sclerosis/calcification is present, without any evidence of aortic stenosis. Aortic valve mean gradient measures 5.0 mmHg. Aortic valve peak gradient measures 10.6 mmHg. Aortic valve area, by VTI measures 2.94 cm. Pulmonic Valve: The pulmonic valve was normal in structure. Pulmonic valve regurgitation is not visualized. Aorta: The aortic root is normal in size and structure. IAS/Shunts: No atrial level shunt detected by color flow Doppler.  LEFT VENTRICLE PLAX 2D LVIDd:         4.40 cm  Diastology LVIDs:  2.60 cm  LV e' medial:    5.44 cm/s LV PW:         1.00 cm  LV E/e' medial:  16.9 LV IVS:        1.00 cm  LV e' lateral:   9.36 cm/s LVOT diam:     2.20 cm  LV E/e' lateral: 9.8 LV SV:         88 LV SV Index:   44 LVOT Area:     3.80 cm  RIGHT VENTRICLE RV Basal diam:  3.60 cm TAPSE (M-mode): 1.8 cm LEFT ATRIUM             Index       RIGHT ATRIUM           Index LA diam:        4.10 cm 2.05 cm/m  RA Area:     20.50 cm LA Vol (A2C):   25.6 ml 12.83 ml/m RA Volume:   62.50 ml  31.31 ml/m LA Vol (A4C):   55.7 ml 27.91 ml/m LA Biplane Vol: 41.9 ml 20.99 ml/m  AORTIC VALVE                    PULMONIC VALVE AV Area (Vmax):    2.71 cm     PV Vmax:       1.55 m/s AV Area (Vmean):   2.92 cm     PV Vmean:      107.000 cm/s AV Area (VTI):     2.94 cm     PV VTI:        0.287 m AV Vmax:           163.00 cm/s  PV Peak grad:  9.6 mmHg AV Vmean:          102.000 cm/s PV Mean grad:  5.0 mmHg AV VTI:            0.300 m AV Peak Grad:      10.6 mmHg AV Mean Grad:      5.0 mmHg LVOT Vmax:         116.00 cm/s LVOT Vmean:        78.400 cm/s LVOT VTI:          0.232 m LVOT/AV VTI ratio: 0.77  AORTA Ao Root diam: 3.60 cm MITRAL VALVE MV  Area (PHT): 2.63 cm    SHUNTS MV Area VTI:   2.34 cm    Systemic VTI:  0.23 m MV Peak grad:  3.2 mmHg    Systemic Diam: 2.20 cm MV Mean grad:  1.0 mmHg MV Vmax:       0.89 m/s MV Vmean:      49.0 cm/s MV Decel Time: 288 msec MV E velocity: 92.10 cm/s MV A velocity: 87.00 cm/s MV E/A ratio:  1.06 Kate Sable MD Electronically signed by Kate Sable MD Signature Date/Time: 10/24/2020/3:39:51 PM    Final    CT HEAD CODE STROKE WO CONTRAST  Addendum Date: 10/23/2020   ADDENDUM REPORT: 10/23/2020 11:47 ADDENDUM: Study discussed by telephone with Dr. Cherylann Banas in the ED on 10/23/2020 at 1144 hours. Electronically Signed   By: Genevie Ann M.D.   On: 10/23/2020 11:47   Result Date: 10/23/2020 CLINICAL DATA:  Code stroke. 75 year old male with blurred vision and dizziness. Hypertensive. EXAM: CT HEAD WITHOUT CONTRAST TECHNIQUE: Contiguous axial images were obtained from the base of the skull through the vertex without intravenous contrast. COMPARISON:  None. FINDINGS: Brain: Cerebral volume  is within normal limits for age. No midline shift, ventriculomegaly, mass effect, evidence of mass lesion, intracranial hemorrhage or evidence of cortically based acute infarction. Patchy and confluent bilateral cerebral white matter hypodensity. Deep gray nuclei appear spared. No cortical encephalomalacia identified. Vascular: No suspicious intracranial vascular hyperdensity. Skull: Negative. Partially visible cervical spine fusion changes on the scout view. Sinuses/Orbits: Visible paranasal sinuses are clear. Tympanic cavities also appear clear. Some chronic sclerotic changes are noted to the mastoids which are relatively well pneumatized. Other: Visualized orbits and scalp soft tissues are within normal limits. ASPECTS [facility] Stroke Program Early CT Score) Total score (0-10 with 10 being normal): 10 IMPRESSION: 1. No acute cortically based infarct or acute intracranial hemorrhage identified. ASPECTS 10. 2. 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 Electronically Signed: By: Genevie Ann M.D. On: 10/23/2020 11:40    Assessment & Plan    TIA/diplopia-MRI was unremarkable.  TEE showed no cardiac source of emboli.  He had no evidence of right to left shunt.  Agitated contrast was used.  Left atrial appendage was unremarkable.  No vegetations or thrombi of the left heart valves.  Etiology of diplopia is unclear but does not appear to be secondary to cardiac abnormalities.  Signed, Javier Docker Jaleeya Mcnelly MD 10/26/2020, 12:13 PM  Pager: [phone]

## 2020-10-26 NOTE — Progress Notes (Signed)
Physical Therapy Treatment Patient Details Name: Timothy Matthews MRN: [id number] DOB: 11-01-1945 Today's Date: 10/26/2020    History of Present Illness Per MD notes: Pt is a 75 y.o. male with medical history significant of HTN, Hep C, cervical spine Sx, prostate cancer, emphysema, cocaine abuse, PTSD who presents to the ED with acute onset of vertigo, gait unsteadiness and double vision.  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    PT Comments    Pt presents with main c/o diplopia. Components of a vestibular exam completed. 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 During H test his L eye roams away from the target but does not refocus on target (no saccade), disconjugate gaze. With occlusion of the L eye the pt reports no diplopia. He completes a balance assessment and demonstrates improvement of safety with use of an AD. At this time if the pt has 24/7 assistance and occlusion glasses he could demonstrate safety to d/c home with family care with outpatient vestibular PT follow-up. If not the pt will need to continue with current plan of care.    Follow Up Recommendations  SNF;Supervision for mobility/OOB     Equipment Recommendations  Rolling walker with 5" wheels (may be able to progress to [facility])    Recommendations for Other Services       Precautions / Restrictions Precautions Precautions: Fall Restrictions Weight Bearing Restrictions: No    Mobility  Bed Mobility Overal bed mobility: Independent                Transfers Overall transfer level: Needs assistance Equipment used: None;Rolling walker (2 wheeled) Transfers: Sit to/from Stand Sit to Stand: Supervision            Ambulation/Gait Ambulation/Gait assistance: Min assist Gait Distance (Feet): 400 Feet Assistive device: Rolling walker (2 wheeled);None (200' without  AD, walking with head turns and changing gait speeds to command. Pt with x3 LOB that required Min A for correction. 200' with AD, walking with head turns and changing gait speeds to command. No overt LOB with this.) Gait Pattern/deviations: Step-through pattern     General Gait Details: pt manuevering obstacles this session without cueing.   Stairs             Wheelchair Mobility    Modified Rankin (Stroke Patients Only)       Balance Overall balance assessment: Needs assistance Sitting-balance support: Feet supported;No upper extremity supported Sitting balance-Leahy Scale: Good Sitting balance - Comments: No lean noted this session.   Standing balance support: No upper extremity supported;During functional activity Standing balance-Leahy Scale: Fair               High level balance activites: Sudden stops;Head turns (gait speed changes)   Standardized Balance Assessment Standardized Balance Assessment : Dynamic Gait Index   Dynamic Gait Index Level Surface: Mild Impairment Change in Gait Speed: Mild Impairment Gait with Horizontal Head Turns: Mild Impairment Gait with Vertical Head Turns: Mild Impairment Gait and Pivot Turn: Moderate Impairment Step Over Obstacle: Moderate Impairment Step Around Obstacles: Mild Impairment Steps: Mild Impairment Total Score: 14      Cognition Arousal/Alertness: Awake/alert Behavior During Therapy: WFL for tasks assessed/performed Overall Cognitive Status: Within Functional Limits for tasks assessed  Exercises Other Exercises Other Exercises: H-test: pt noted to present with L beating nystagmus in the L eye only. Other Exercises: Cover/uncover test: normal Other Exercises: Convergence testing: normal    General Comments        Pertinent Vitals/Pain Pain Assessment: No/denies pain    Home Living                      Prior Function             PT Goals (current goals can now be found in the care plan section) Acute Rehab PT Goals Patient Stated Goal: To return home PT Goal Formulation: With patient Time For Goal Achievement: 11/05/20 Potential to Achieve Goals: Good Progress towards PT goals: Progressing toward goals    Frequency    Min 2X/week      PT Plan Current plan remains appropriate    Co-evaluation              AM-PAC PT "6 Clicks" Mobility   Outcome Measure  Help needed turning from your back to your side while in a flat bed without using bedrails?: None Help needed moving from lying on your back to sitting on the side of a flat bed without using bedrails?: None Help needed moving to and from a bed to a chair (including a wheelchair)?: A Little Help needed standing up from a chair using your arms (e.g., wheelchair or bedside chair)?: A Little Help needed to walk in hospital room?: A Little Help needed climbing 3-5 steps with a railing? : A Little 6 Click Score: 20    End of Session Equipment Utilized During Treatment: Gait belt Activity Tolerance: Patient tolerated treatment well Patient left: in bed;with call bell/phone within reach;Other (comment) Nurse Communication: Mobility status PT Visit Diagnosis: Unsteadiness on feet (R26.81);History of falling (Z91.81);Difficulty in walking, not elsewhere classified (R26.2)     Time: 1350-1415 PT Time Calculation (min) (ACUTE ONLY): 25 min  Charges:  $Gait Training: 23-37 mins                     3:58 PM, 10/26/20 [place] Shawgo A. Saverio Danker PT, DPT Physical Therapist - [facility]    Danilyn Cocke A Mahdi Frye 10/26/2020, 3:50 PM

## 2020-10-26 NOTE — Anesthesia Postprocedure Evaluation (Signed)
Anesthesia Post Note  Patient: Timothy Matthews  Procedure(s) Performed: TRANSESOPHAGEAL ECHOCARDIOGRAM (TEE) (N/A )  Patient location during evaluation: Specials Recovery Anesthesia Type: General Level of consciousness: awake and alert Pain management: pain level controlled Vital Signs Assessment: post-procedure vital signs reviewed and stable Respiratory status: spontaneous breathing, nonlabored ventilation, respiratory function stable and patient connected to nasal cannula oxygen Cardiovascular status: blood pressure returned to baseline and stable Postop Assessment: no apparent nausea or vomiting Anesthetic complications: no   No complications documented.   Last Vitals:  Vitals:   10/26/20 0813 10/26/20 0845  BP: (!) 146/72 (!) 153/84  Pulse: (!) 50 (!) 57  Resp: 18 18  Temp: 36.6 C   SpO2: 95% 97%    Last Pain:  Vitals:   10/26/20 0845  TempSrc:   PainSc: 0-No pain                 Arita Miss

## 2020-10-26 NOTE — Progress Notes (Signed)
Subjective: Continued double vision  Objective: Current vital signs: BP 128/75 (BP Location: Left Arm)   Pulse (!) 55   Temp 97.9 F (36.6 C)   Resp 18   Wt 84.9 kg   SpO2 96%   BMI 26.85 kg/m  Vital signs in last 24 hours: Temp:  [97.6 F (36.4 C)-98.2 F (36.8 C)] 97.9 F (36.6 C) (01/14 1624) Pulse Rate:  [50-59] 55 (01/14 1624) Resp:  [14-19] 18 (01/14 1624) BP: (121-163)/(71-104) 128/75 (01/14 1624) SpO2:  [92 %-98 %] 96 % (01/14 1624)  Intake/Output from previous day: 01/13 0701 - 01/14 0700 In: 1440 [P.O.:1440] Out: 2425 [Urine:2425] Intake/Output this shift: Total I/O In: 0.8 [I.V.:0.8] Out: 925 [Urine:925] Nutritional status:  Diet Order            Diet Heart Room service appropriate? Yes; Fluid consistency: Thin  Diet effective now                 Neurologic Exam: Ment: Intact to complex questions and commands CN: Exam continues to exhibit right eye lagging left when gazing to the left, in conjunction with horizontal nystagmus of left eye only during far leftward gaze. Also with vertical nystagmus bilaterally on upgaze. Motor: Normal strength x 4 Cerebellar: No ataxia noted.  Gait: Unsteady gait with both eyes open, improves significantly with one eye closed. Mild unsteadiness requiring contact guard.   Lab Results: Results for orders placed or performed during the hospital encounter of 10/23/20 (from the past 48 hour(s))  CBC     Status: None   Collection Time: 10/25/20  6:19 AM  Result Value Ref Range   WBC 5.7 4.0 - 10.5 K/uL   RBC 4.78 4.22 - 5.81 MIL/uL   Hemoglobin 13.3 13.0 - 17.0 g/dL   HCT 40.1 39.0 - 52.0 %   MCV 83.9 80.0 - 100.0 fL   MCH 27.8 26.0 - 34.0 pg   MCHC 33.2 30.0 - 36.0 g/dL   RDW 14.6 11.5 - 15.5 %   Platelets 210 150 - 400 K/uL   nRBC 0.0 0.0 - 0.2 %    Comment: Performed at [facility], [address], [place], [place] [address]  Magnesium     Status: None   Collection Time: 10/25/20  6:19 AM  Result  Value Ref Range   Magnesium 2.0 1.7 - 2.4 mg/dL    Comment: Performed at [facility], [address], [place], [place] [address]  Phosphorus     Status: None   Collection Time: 10/25/20  6:19 AM  Result Value Ref Range   Phosphorus 3.6 2.5 - 4.6 mg/dL    Comment: Performed at [facility], [address] [place], [place] [id number]  Basic metabolic panel     Status: None   Collection Time: 10/25/20  6:19 AM  Result Value Ref Range   Sodium 136 135 - 145 mmol/L   Potassium 3.8 3.5 - 5.1 mmol/L   Chloride 101 98 - 111 mmol/L   CO2 26 22 - 32 mmol/L   Glucose, Bld 98 70 - 99 mg/dL    Comment: Glucose reference range applies only to samples taken after fasting for at least 8 hours.   BUN 19 8 - 23 mg/dL   Creatinine, Ser 0.99 0.61 - 1.24 mg/dL   Calcium 8.9 8.9 - 10.3 mg/dL   GFR, Estimated >60 >60 mL/min    Comment: (NOTE) Calculated using the CKD-EPI Creatinine Equation (2021)    Anion gap 9 5 -  15    Comment: Performed at [facility], [address] [place], [place] [address]  Phosphorus     Status: None   Collection Time: 10/26/20  5:01 AM  Result Value Ref Range   Phosphorus 3.7 2.5 - 4.6 mg/dL    Comment: Performed at [facility], [address] [place], [place] [address]  Magnesium     Status: None   Collection Time: 10/26/20  5:01 AM  Result Value Ref Range   Magnesium 1.9 1.7 - 2.4 mg/dL    Comment: Performed at [facility], [address] [place], [place] [address]  Basic metabolic panel     Status: None   Collection Time: 10/26/20  5:01 AM  Result Value Ref Range   Sodium 137 135 - 145 mmol/L   Potassium 4.6 3.5 - 5.1 mmol/L    Comment: HEMOLYSIS AT THIS LEVEL MAY AFFECT RESULT   Chloride 104 98 - 111 mmol/L   CO2 26 22 - 32 mmol/L   Glucose, Bld 96 70 - 99 mg/dL    Comment: Glucose reference range applies only to samples taken after fasting for at least 8 hours.   BUN 20 8 - 23 mg/dL   Creatinine, Ser 1.20  0.61 - 1.24 mg/dL   Calcium 8.9 8.9 - 10.3 mg/dL   GFR, Estimated >60 >60 mL/min    Comment: (NOTE) Calculated using the CKD-EPI Creatinine Equation (2021)    Anion gap 7 5 - 15    Comment: Performed at [facility], [address] [place], [place] [address]  CBC     Status: None   Collection Time: 10/26/20  5:01 AM  Result Value Ref Range   WBC 5.3 4.0 - 10.5 K/uL   RBC 4.75 4.22 - 5.81 MIL/uL   Hemoglobin 13.3 13.0 - 17.0 g/dL   HCT 39.7 39.0 - 52.0 %   MCV 83.6 80.0 - 100.0 fL   MCH 28.0 26.0 - 34.0 pg   MCHC 33.5 30.0 - 36.0 g/dL   RDW 14.2 11.5 - 15.5 %   Platelets 221 150 - 400 K/uL   nRBC 0.0 0.0 - 0.2 %    Comment: Performed at [facility], [address] [place], [place] [address]  Urine Drug Screen, Qualitative ([facility] only)     Status: None   Collection Time: 10/26/20  7:15 AM  Result Value Ref Range   Tricyclic, Ur Screen NONE DETECTED NONE DETECTED   Amphetamines, Ur Screen NONE DETECTED NONE DETECTED   MDMA (Ecstasy)Ur Screen NONE DETECTED NONE DETECTED   Cocaine Metabolite,Ur Rector NONE DETECTED NONE DETECTED   Opiate, Ur Screen NONE DETECTED NONE DETECTED   Phencyclidine (PCP) Ur S NONE DETECTED NONE DETECTED   Cannabinoid 50 Ng, Ur Navesink NONE DETECTED NONE DETECTED   Barbiturates, Ur Screen NONE DETECTED NONE DETECTED   Benzodiazepine, Ur Scrn NONE DETECTED NONE DETECTED   Methadone Scn, Ur NONE DETECTED NONE DETECTED    Comment: (NOTE) Tricyclics + metabolites, urine    Cutoff 1000 ng/mL Amphetamines + metabolites, urine  Cutoff 1000 ng/mL MDMA (Ecstasy), urine              Cutoff 500 ng/mL Cocaine Metabolite, urine          Cutoff 300 ng/mL Opiate + metabolites, urine        Cutoff 300 ng/mL Phencyclidine (PCP), urine         Cutoff 25 ng/mL Cannabinoid, urine  Cutoff 50 ng/mL Barbiturates + metabolites, urine  Cutoff 200 ng/mL Benzodiazepine, urine              Cutoff 200 ng/mL Methadone, urine                   Cutoff 300  ng/mL  The urine drug screen provides only a preliminary, unconfirmed analytical test result and should not be used for non-medical purposes. Clinical consideration and professional judgment should be applied to any positive drug screen result due to possible interfering substances. A more specific alternate chemical method must be used in order to obtain a confirmed analytical result. Gas chromatography / mass spectrometry (GC/MS) is the preferred confirm atory method. Performed at [facility], [address] [place], [place] [address]     Recent Results (from the past 240 hour(s))  Resp Panel by RT-PCR (Flu A&B, Covid) Nasopharyngeal Swab     Status: None   Collection Time: 10/23/20 12:10 PM   Specimen: Nasopharyngeal Swab; Nasopharyngeal(NP) swabs in vial transport medium  Result Value Ref Range Status   SARS Coronavirus 2 by RT PCR NEGATIVE NEGATIVE Final    Comment: (NOTE) SARS-CoV-2 target nucleic acids are NOT DETECTED.  The SARS-CoV-2 RNA is generally detectable in upper respiratory specimens during the acute phase of infection. The lowest concentration of SARS-CoV-2 viral copies this assay can detect is 138 copies/mL. A negative result does not preclude SARS-Cov-2 infection and should not be used as the sole basis for treatment or other patient management decisions. A negative result may occur with  improper specimen collection/handling, submission of specimen other than nasopharyngeal swab, presence of viral mutation(s) within the areas targeted by this assay, and inadequate number of viral copies(<138 copies/mL). A negative result must be combined with clinical observations, patient history, and epidemiological information. The expected result is Negative.  Fact Sheet for Patients:  EntrepreneurPulse.com.au  Fact Sheet for Healthcare Providers:  IncredibleEmployment.be  This test is no t yet approved or cleared by  the [place] FDA and  has been authorized for detection and/or diagnosis of SARS-CoV-2 by FDA under an Emergency Use Authorization (EUA). This EUA will remain  in effect (meaning this test can be used) for the duration of the COVID-19 declaration under Section 564(b)(1) of the Act, 21 U.S.C.section 360bbb-3(b)(1), unless the authorization is terminated  or revoked sooner.       Influenza A by PCR NEGATIVE NEGATIVE Final   Influenza B by PCR NEGATIVE NEGATIVE Final    Comment: (NOTE) The Xpert Xpress SARS-CoV-2/FLU/RSV plus assay is intended as an aid in the diagnosis of influenza from Nasopharyngeal swab specimens and should not be used as a sole basis for treatment. Nasal washings and aspirates are unacceptable for Xpert Xpress SARS-CoV-2/FLU/RSV testing.  Fact Sheet for Patients: EntrepreneurPulse.com.au  Fact Sheet for Healthcare Providers: IncredibleEmployment.be  This test is not yet approved or cleared by the [place] FDA and has been authorized for detection and/or diagnosis of SARS-CoV-2 by FDA under an Emergency Use Authorization (EUA). This EUA will remain in effect (meaning this test can be used) for the duration of the COVID-19 declaration under Section 564(b)(1) of the Act, 21 U.S.C. section 360bbb-3(b)(1), unless the authorization is terminated or revoked.  Performed at [facility], [address] [place], [place] [address]     Lipid Panel Recent Labs    10/24/20 0426  CHOL 135  TRIG 109  HDL 49  CHOLHDL 2.8  VLDL 22  LDLCALC 64  Studies/Results: ECHO TEE  Result Date: 10/26/2020    TRANSESOPHOGEAL ECHO REPORT   Patient Name:   ALGIN OSEN Date of Exam: 10/26/2020 Medical Rec #:  [id number]         Height:       70.0 in Accession #:    [id number]        Weight:       187.1 lb Date of Birth:  1946/08/28          BSA:          2.029 m Patient Age:    39 years          BP:           146/72  mmHg Patient Gender: M                 HR:           50 bpm. Exam Location:  [facility] Procedure: Transesophageal Echo, Cardiac Doppler, Color Doppler and Saline            Contrast Bubble Study Indications:     TIA 435.9/ G 45.9  History:         Patient has prior history of Echocardiogram examinations, most                  recent 10/24/2020. Risk Factors:Hypertension.  Sonographer:     Sherrie Sport RDCS (AE) Referring Phys:  [place] Diagnosing Phys: Bartholome Bill MD PROCEDURE: TEE procedure time was 11 minutes. The transesophogeal probe was passed without difficulty through the esophogus of the patient. Imaged were obtained with the patient in a left lateral decubitus position. Sedation performed by different physician. The patient was monitored while under deep sedation. Anesthestetic sedation was provided intravenously by Anesthesiology: 120mg  of Propofol. Image quality was excellent. The patient's vital signs; including heart rate, blood pressure, and oxygen saturation; remained stable throughout the procedure. The patient developed no complications during the procedure. IMPRESSIONS  1. Left ventricular ejection fraction, by estimation, is 50 to 55%. The left ventricle has low normal function. The left ventricle has no regional wall motion abnormalities. Left ventricular diastolic parameters were normal.  2. Right ventricular systolic function is normal. The right ventricular size is normal.  3. No left atrial/left atrial appendage thrombus was detected.  4. The mitral valve is grossly normal. Trivial mitral valve regurgitation.  5. The aortic valve is tricuspid. Aortic valve regurgitation is trivial. FINDINGS  Left Ventricle: Left ventricular ejection fraction, by estimation, is 50 to 55%. The left ventricle has low normal function. The left ventricle has no regional wall motion abnormalities. The left ventricular internal cavity size was normal in size. There is no left ventricular hypertrophy. Left  ventricular diastolic parameters were normal. Right Ventricle: The right ventricular size is normal. No increase in right ventricular wall thickness. Right ventricular systolic function is normal. Left Atrium: Left atrial size was normal in size. No left atrial/left atrial appendage thrombus was detected. Right Atrium: Right atrial size was normal in size. Pericardium: There is no evidence of pericardial effusion. Mitral Valve: The mitral valve is grossly normal. Trivial mitral valve regurgitation. There is no evidence of mitral valve vegetation. Tricuspid Valve: The tricuspid valve is grossly normal. Tricuspid valve regurgitation is mild. There is no evidence of tricuspid valve vegetation. Aortic Valve: The aortic valve is tricuspid. Aortic valve regurgitation is trivial. There is no evidence of aortic valve vegetation. Pulmonic Valve: The pulmonic valve was not well visualized.  Pulmonic valve regurgitation is trivial. Aorta: The aortic root is normal in size and structure. IAS/Shunts: No atrial level shunt detected by color flow Doppler. Agitated saline contrast was given intravenously to evaluate for intracardiac shunting. Bartholome Bill MD Electronically signed by Bartholome Bill MD Signature Date/Time: 10/26/2020/12:46:56 PM    Final     Medications:  Scheduled: .  stroke: mapping our early stages of recovery book   Does not apply Once  . amLODipine  10 mg Oral Daily  . aspirin EC  325 mg Oral Daily  . enoxaparin (LOVENOX) injection  40 mg Subcutaneous Q24H  . gabapentin  300 mg Oral TID  . loratadine  10 mg Oral Daily  . pantoprazole  40 mg Oral Daily  . senna-docusate  1 tablet Oral QHS  . tamsulosin  0.8 mg Oral Daily   Continuous: . sodium chloride Stopped (10/26/20 1639)    Assessment: 75 y.o. male with a PMHx of HTN, hepatitis C, cocaine abuse and prostate cancer, who presented on Tuesday with acute onset of vertigo, gait unsteadiness and double vision 1. Exam continues to exhibit right eye  lagging left when gazing to the left, in conjunction with horizontal nystagmus of left eye only during far leftward gaze. Also with vertical nystagmus bilaterally on upgaze. 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 However, MRI brain was negative for an acute infarction of the right pons or midbrain. It is possible for early infarctions in the brainstem to be missed by MRI and a repeat MRI should be obtained.  2. Stroke Risk Factors - HTN and cancer history  3. Fasting lipid panel was normal.  4. TTE unremarkable:  5. Of note, the patient has a long-standing history of cocaine abuse. If above work up is negative, then vasospasm secondary to effects of cocaine may be the most likely etiology for his presentation. Would interview the patient about possible cocaine use prior to onset of his symptoms.   Recommendations: 1. HgbA1c has been ordered 2. Repeat MRI of the brain with special attention to the brainstem (thin DWI, T2 and FLAIR slices through the pons and midbrain) 3. PT consult, OT consult, Speech consult 4. Will need TEE to further assess for possible small mural thrombus that could be missed by TTE 5. Continue cardiac telemetry. If it continues to be negative, will need either a loop recorder or a Holter monitor to assess for possible paroxysmal atrial fibrillation over a longer period of time. 6. Prophylactic therapy- Continue ASA 81 mg po qd.   7. BP management   LOS: 1 day   @Electronically  signed: Dr. Kerney Elbe 10/26/2020  5:23 PM

## 2020-10-26 NOTE — Progress Notes (Signed)
PT Cancellation Note  Patient Details Name: Timothy Matthews MRN: [id number] DOB: 1945-10-15   Cancelled Treatment:    Reason Eval/Treat Not Completed: Patient at procedure or test/unavailable (Pt off the floor for TEE. Will check back at later time.)  8:24 AM, 10/26/20 Martin Smeal A. Saverio Danker PT, DPT Physical Therapist - [facility]  Makensie Mulhall A Shalondra Wunschel 10/26/2020, 8:24 AM

## 2020-10-26 NOTE — Plan of Care (Signed)
  Problem: Clinical Measurements: Goal: Ability to maintain clinical measurements within normal limits will improve Outcome: Progressing   Problem: Activity: Goal: Risk for activity intolerance will decrease Outcome: Progressing   Problem: Safety: Goal: Ability to remain free from injury will improve Outcome: Progressing   

## 2020-10-27 DIAGNOSIS — I639 Cerebral infarction, unspecified: Secondary | ICD-10-CM | POA: Diagnosis not present

## 2020-10-27 DIAGNOSIS — I10 Essential (primary) hypertension: Secondary | ICD-10-CM | POA: Diagnosis not present

## 2020-10-27 MED ORDER — POLYVINYL ALCOHOL 1.4 % OP SOLN
1.0000 [drp] | OPHTHALMIC | Status: DC | PRN
  Administered 2020-10-27 – 2020-10-28 (×2): 1 [drp] via OPHTHALMIC
  Filled 2020-10-27: qty 15

## 2020-10-27 NOTE — Progress Notes (Signed)
Subjective: Continued double vision.   Objective: Current vital signs: BP (!) 142/84 (BP Location: Left Arm)   Pulse (!) 54   Temp 98.1 F (36.7 C) (Oral)   Resp 20   Wt 83.6 kg   SpO2 96%   BMI 26.43 kg/m  Vital signs in last 24 hours: Temp:  [97.7 F (36.5 C)-98.6 F (37 C)] 98.1 F (36.7 C) (01/15 0306) Pulse Rate:  [53-59] 54 (01/15 0306) Resp:  [14-20] 20 (01/15 0306) BP: (128-154)/(75-84) 142/84 (01/15 0306) SpO2:  [96 %-98 %] 96 % (01/15 0306) Weight:  [83.6 kg] 83.6 kg (01/15 0306)  Intake/Output from previous day: 01/14 0701 - 01/15 0700 In: 0.8 [I.V.:0.8] Out: 1725 [Urine:1725] Intake/Output this shift: Total I/O In: -  Out: 400 [Urine:400] Nutritional status:  Diet Order            Diet Heart Room service appropriate? Yes; Fluid consistency: Thin  Diet effective now                HEENT: [place]/AT Lungs: Respirations unlabored  Neurologic Exam: Ment: Intact to complex questions and commands. CN: Unchanged ocular motility deficits (right INO) Motor: Moves extremities normally.   Lab Results: Results for orders placed or performed during the hospital encounter of 10/23/20 (from the past 48 hour(s))  Phosphorus     Status: None   Collection Time: 10/26/20  5:01 AM  Result Value Ref Range   Phosphorus 3.7 2.5 - 4.6 mg/dL    Comment: Performed at [facility], [address] [place], [place] [address]  Magnesium     Status: None   Collection Time: 10/26/20  5:01 AM  Result Value Ref Range   Magnesium 1.9 1.7 - 2.4 mg/dL    Comment: Performed at [facility], [address] [place], [place] [address]  Basic metabolic panel     Status: None   Collection Time: 10/26/20  5:01 AM  Result Value Ref Range   Sodium 137 135 - 145 mmol/L   Potassium 4.6 3.5 - 5.1 mmol/L    Comment: HEMOLYSIS AT THIS LEVEL MAY AFFECT RESULT   Chloride 104 98 - 111 mmol/L   CO2 26 22 - 32 mmol/L   Glucose, Bld 96 70 - 99 mg/dL    Comment: Glucose  reference range applies only to samples taken after fasting for at least 8 hours.   BUN 20 8 - 23 mg/dL   Creatinine, Ser 1.20 0.61 - 1.24 mg/dL   Calcium 8.9 8.9 - 10.3 mg/dL   GFR, Estimated >60 >60 mL/min    Comment: (NOTE) Calculated using the CKD-EPI Creatinine Equation (2021)    Anion gap 7 5 - 15    Comment: Performed at [facility], [address] [place], [place] [address]  CBC     Status: None   Collection Time: 10/26/20  5:01 AM  Result Value Ref Range   WBC 5.3 4.0 - 10.5 K/uL   RBC 4.75 4.22 - 5.81 MIL/uL   Hemoglobin 13.3 13.0 - 17.0 g/dL   HCT 39.7 39.0 - 52.0 %   MCV 83.6 80.0 - 100.0 fL   MCH 28.0 26.0 - 34.0 pg   MCHC 33.5 30.0 - 36.0 g/dL   RDW 14.2 11.5 - 15.5 %   Platelets 221 150 - 400 K/uL   nRBC 0.0 0.0 - 0.2 %    Comment: Performed at [facility], [address], [place], [place] [address]  Hemoglobin A1c  Status: None   Collection Time: 10/26/20  5:01 AM  Result Value Ref Range   Hgb A1c MFr Bld 5.6 4.8 - 5.6 %    Comment: (NOTE) Pre diabetes:          5.7%-6.4%  Diabetes:              >6.4%  Glycemic control for   <7.0% adults with diabetes    Mean Plasma Glucose 114.02 mg/dL    Comment: Performed at [facility] [address], [place], [place] [address]  Urine Drug Screen, Qualitative ([place] only)     Status: None   Collection Time: 10/26/20  7:15 AM  Result Value Ref Range   Tricyclic, Ur Screen NONE DETECTED NONE DETECTED   Amphetamines, Ur Screen NONE DETECTED NONE DETECTED   MDMA (Ecstasy)Ur Screen NONE DETECTED NONE DETECTED   Cocaine Metabolite,Ur [place] NONE DETECTED NONE DETECTED   Opiate, Ur Screen NONE DETECTED NONE DETECTED   Phencyclidine (PCP) Ur S NONE DETECTED NONE DETECTED   Cannabinoid 50 Ng, Ur [place] NONE DETECTED NONE DETECTED   Barbiturates, Ur Screen NONE DETECTED NONE DETECTED   Benzodiazepine, Ur Scrn NONE DETECTED NONE DETECTED   Methadone Scn, Ur NONE DETECTED NONE DETECTED    Comment:  (NOTE) Tricyclics + metabolites, urine    Cutoff 1000 ng/mL Amphetamines + metabolites, urine  Cutoff 1000 ng/mL MDMA (Ecstasy), urine              Cutoff 500 ng/mL Cocaine Metabolite, urine          Cutoff 300 ng/mL Opiate + metabolites, urine        Cutoff 300 ng/mL Phencyclidine (PCP), urine         Cutoff 25 ng/mL Cannabinoid, urine                 Cutoff 50 ng/mL Barbiturates + metabolites, urine  Cutoff 200 ng/mL Benzodiazepine, urine              Cutoff 200 ng/mL Methadone, urine                   Cutoff 300 ng/mL  The urine drug screen provides only a preliminary, unconfirmed analytical test result and should not be used for non-medical purposes. Clinical consideration and professional judgment should be applied to any positive drug screen result due to possible interfering substances. A more specific alternate chemical method must be used in order to obtain a confirmed analytical result. Gas chromatography / mass spectrometry (GC/MS) is the preferred confirm atory method. Performed at [facility], [address], [place], [place] [address]     Recent Results (from the past 240 hour(s))  Resp Panel by RT-PCR (Flu A&B, Covid) Nasopharyngeal Swab     Status: None   Collection Time: 10/23/20 12:10 PM   Specimen: Nasopharyngeal Swab; Nasopharyngeal(NP) swabs in vial transport medium  Result Value Ref Range Status   SARS Coronavirus 2 by RT PCR NEGATIVE NEGATIVE Final    Comment: (NOTE) SARS-CoV-2 target nucleic acids are NOT DETECTED.  The SARS-CoV-2 RNA is generally detectable in upper respiratory specimens during the acute phase of infection. The lowest concentration of SARS-CoV-2 viral copies this assay can detect is 138 copies/mL. A negative result does not preclude SARS-Cov-2 infection and should not be used as the sole basis for treatment or other patient management decisions. A negative result may occur with  improper specimen collection/handling,  submission of specimen other than nasopharyngeal swab, presence of viral  mutation(s) within the areas targeted by this assay, and inadequate number of viral copies(<138 copies/mL). A negative result must be combined with clinical observations, patient history, and epidemiological information. The expected result is Negative.  Fact Sheet for Patients:  EntrepreneurPulse.com.au  Fact Sheet for Healthcare Providers:  IncredibleEmployment.be  This test is no t yet approved or cleared by the [place] FDA and  has been authorized for detection and/or diagnosis of SARS-CoV-2 by FDA under an Emergency Use Authorization (EUA). This EUA will remain  in effect (meaning this test can be used) for the duration of the COVID-19 declaration under Section 564(b)(1) of the Act, 21 U.S.C.section 360bbb-3(b)(1), unless the authorization is terminated  or revoked sooner.       Influenza A by PCR NEGATIVE NEGATIVE Final   Influenza B by PCR NEGATIVE NEGATIVE Final    Comment: (NOTE) The Xpert Xpress SARS-CoV-2/FLU/RSV plus assay is intended as an aid in the diagnosis of influenza from Nasopharyngeal swab specimens and should not be used as a sole basis for treatment. Nasal washings and aspirates are unacceptable for Xpert Xpress SARS-CoV-2/FLU/RSV testing.  Fact Sheet for Patients: EntrepreneurPulse.com.au  Fact Sheet for Healthcare Providers: IncredibleEmployment.be  This test is not yet approved or cleared by the [place] FDA and has been authorized for detection and/or diagnosis of SARS-CoV-2 by FDA under an Emergency Use Authorization (EUA). This EUA will remain in effect (meaning this test can be used) for the duration of the COVID-19 declaration under Section 564(b)(1) of the Act, 21 U.S.C. section 360bbb-3(b)(1), unless the authorization is terminated or revoked.  Performed at [facility], [address] [place], [place] [address]     Lipid Panel No results for input(s): CHOL, TRIG, HDL, CHOLHDL, VLDL, LDLCALC in the last 72 hours.  Studies/Results: MR BRAIN WO CONTRAST  Result Date: 10/26/2020 CLINICAL DATA:  Stroke follow-up. EXAM: MRI HEAD WITHOUT CONTRAST TECHNIQUE: Multiplanar, multiecho pulse sequences of the brain and surrounding structures were obtained without intravenous contrast. COMPARISON:  MRI of the brain October 23, 2020. FINDINGS: Brain: Two punctate foci of restricted diffusion are seen in the brainstem, one within the mesencephalic tegmentum on the right (series 5, image 19) and the second within the dorsal pons at the level of the right facial colliculus (series 5, image 15). No other focus of restricted diffusion. No hemorrhage, hydrocephalus, extra-axial collection or mass lesion. Scattered foci of T2 hyperintensity are seen within the white matter of the cerebral hemispheres, nonspecific, most likely related to chronic small vessel ischemia. Vascular: Normal flow voids. Skull and upper cervical spine: Postsurgical changes from spinal fusion. No focal marrow lesion identified. Sinuses/Orbits: Mild mucosal thickening of the ethmoid cells. The orbits are maintained. IMPRESSION: 1. Two punctate foci of restricted diffusion in the brainstem, one within the mesencephalic tegmentum on the right and the second within the dorsal pons at the level of the right facial colliculus. 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 2. Mild chronic small vessel ischemia. Electronically Signed   By: Pedro Earls M.D.   On: 10/26/2020 19:27   ECHO TEE  Result Date: 10/26/2020    TRANSESOPHOGEAL ECHO REPORT   Patient Name:   Timothy Matthews Date of Exam: 10/26/2020 Medical Rec #:  [id number]         Height:       70.0 in Accession #:    [id number]        Weight:       187.1 lb Date of Birth:  08-25-1946          BSA:          2.029 m Patient Age:    74 years          BP:            146/72 mmHg Patient Gender: M                 HR:           50 bpm. Exam Location:  [facility] Procedure: Transesophageal Echo, Cardiac Doppler, Color Doppler and Saline            Contrast Bubble Study Indications:     TIA 435.9/ G 45.9  History:         Patient has prior history of Echocardiogram examinations, most                  recent 10/24/2020. Risk Factors:Hypertension.  Sonographer:     Sherrie Sport RDCS (AE) Referring Phys:  [place] Diagnosing Phys: Bartholome Bill MD PROCEDURE: TEE procedure time was 11 minutes. The transesophogeal probe was passed without difficulty through the esophogus of the patient. Imaged were obtained with the patient in a left lateral decubitus position. Sedation performed by different physician. The patient was monitored while under deep sedation. Anesthestetic sedation was provided intravenously by Anesthesiology: 120mg  of Propofol. Image quality was excellent. The patient's vital signs; including heart rate, blood pressure, and oxygen saturation; remained stable throughout the procedure. The patient developed no complications during the procedure. IMPRESSIONS  1. Left ventricular ejection fraction, by estimation, is 50 to 55%. The left ventricle has low normal function. The left ventricle has no regional wall motion abnormalities. Left ventricular diastolic parameters were normal.  2. Right ventricular systolic function is normal. The right ventricular size is normal.  3. No left atrial/left atrial appendage thrombus was detected.  4. The mitral valve is grossly normal. Trivial mitral valve regurgitation.  5. The aortic valve is tricuspid. Aortic valve regurgitation is trivial. FINDINGS  Left Ventricle: Left ventricular ejection fraction, by estimation, is 50 to 55%. The left ventricle has low normal function. The left ventricle has no regional wall motion abnormalities. The left ventricular internal cavity size was normal in size. There is no left ventricular  hypertrophy. Left ventricular diastolic parameters were normal. Right Ventricle: The right ventricular size is normal. No increase in right ventricular wall thickness. Right ventricular systolic function is normal. Left Atrium: Left atrial size was normal in size. No left atrial/left atrial appendage thrombus was detected. Right Atrium: Right atrial size was normal in size. Pericardium: There is no evidence of pericardial effusion. Mitral Valve: The mitral valve is grossly normal. Trivial mitral valve regurgitation. There is no evidence of mitral valve vegetation. Tricuspid Valve: The tricuspid valve is grossly normal. Tricuspid valve regurgitation is mild. There is no evidence of tricuspid valve vegetation. Aortic Valve: The aortic valve is tricuspid. Aortic valve regurgitation is trivial. There is no evidence of aortic valve vegetation. Pulmonic Valve: The pulmonic valve was not well visualized. Pulmonic valve regurgitation is trivial. Aorta: The aortic root is normal in size and structure. IAS/Shunts: No atrial level shunt detected by color flow Doppler. Agitated saline contrast was given intravenously to evaluate for intracardiac shunting. Bartholome Bill MD Electronically signed by Bartholome Bill MD Signature Date/Time: 10/26/2020/12:46:56 PM    Final     Medications:  Scheduled: .  stroke: mapping our early stages of recovery book   Does not  apply Once  . amLODipine  10 mg Oral Daily  . aspirin EC  325 mg Oral Daily  . enoxaparin (LOVENOX) injection  40 mg Subcutaneous Q24H  . gabapentin  300 mg Oral TID  . loratadine  10 mg Oral Daily  . pantoprazole  40 mg Oral Daily  . senna-docusate  1 tablet Oral QHS  . tamsulosin  0.8 mg Oral Daily   Continuous: . sodium chloride Stopped (10/26/20 1639)   Repeat MRI brain (11/14): 1. Two punctate foci of restricted diffusion in the brainstem, one within the mesencephalic tegmentum on the right and the second within the dorsal pons at the level of the right  facial colliculus. 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 2. Mild chronic small vessel ischemia.  Assessment:74 y.o.malewith a PMHx of HTN, hepatitis C, cocaine abuse and prostate cancer, who presented on Tuesday with acute onset of vertigo, gait unsteadiness and double vision 1. Exam findings consistent with right INO. 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 Repeat MRI brain obtained yesterday (1/14) confirms the presence of a punctate stroke involving the right MLF.  2. Stroke Risk Factors -HTN and cancer history  3. Fasting lipid panel was normal.  4. TTE unremarkable.  5. TEE: No left atrial/left atrial appendage thrombus was detected.  6. CTA of head and neck: Mild segmental irregularity of the circle-of-Willis without a discrete proximal stenosis, aneurysm, or branch vessel occlusion. Minimal atherosclerotic changes at the aortic arch without significant stenosis. Mild tortuosity of the cervical vasculature without significant stenosis in the neck. 7. Of note, the patient has a long-standing history of cocaine abuse. Vasospasm secondary to effects of cocaine are not, however, the etiology for his presentation as he states that his last cocaine use was 4-5 days prior to onset of the stroke symptoms.  Recommendations: 1. PT/OT. PT to specifically address therapy for compensatory strategies to ameliorate the gait instability he is experiencing as a result of his binocular double vision.  2.Continue cardiac telemetry. If it continues to be negative, will need either a loop recorder or a Holter monitor to assess for possible paroxysmal atrial fibrillation over a longer period of time. 3. Prophylactic therapy-Continue ASA 81 mg po qd.  4. BP management 5. Outpatient Neurology follow up.  6. Neurohospitalist service will sign off at this time. Please call if there are additional  questions.    LOS: 2 days   @Electronically  signed: Dr. Kerney Elbe 10/27/2020  9:04 AM

## 2020-10-27 NOTE — Progress Notes (Signed)
PROGRESS NOTE    Timothy Matthews  [id number] DOB: Apr 12, 1946 DOA: 10/23/2020 PCP: Will Bonnet, MD   Brief Narrative:  This 75 years old male with PMH significant for hypertension, prostate cancer, emphysema, cocaine abuse, PTSD who presented to the ED with acute onset of vertigo, unsteadiness and double vision.  Patient was admitted to rule out a stroke.  CT head without acute finding.  CTA head and neck without LVO,  mild segmental irregularity.  Patient was evaluated by neurology,  offered tPA but patient declined.  MRI head without any acute abnormalities.  Patient continues to have double vision, neurology recommended TEE to rule out mural emboli.  Cardiology consulted, patient underwent TEE which was unremarkable with no source of cardiac emboli noted. Patient continues to have double vision and unsteadiness.  Repeat MRI brain confirms the presence of punctate stroke involving the right MLF.  Assessment & Plan:   Active Problems:   CVA (cerebral vascular accident) ([place])   Cocaine abuse ([place])   HTN (hypertension)   Emphysema lung ([facility])   GERD (gastroesophageal reflux disease)   Vertigo, unsteadiness and double vision could be secondary to CVA -  Patient was admitted for stroke work-up.  Neurology consulted. most likely brainstem event vs cerebellar event. Pt offered and declined TPA. Continue telemetry PT/OT and speech evaluation. MRI: no acute abnormalities noted, patient continues to have double vision. TTE: LVEF 60 to 65%, no regional wall motion abnormality. Neurology recommended TEE to rule out mural emboli. Cardiology consulted,  scheduled to have TEE 1/14. TEE completed, no cardiac source of emboli. Had no evidence of right to left shunt. No vegetation or thrombi of left heart valves. Etiology of diplopia is unclear but does not appear to be secondary to cardiac abnormalities as per cardiology. Continue aspirin and statin Smoking cessation, cocaine  cessation. Repeat MRI brain yesterday confirms the presence of a punctate stroke involving the right MLF. Neurology recommended PT and OT to specifically address therapy for compensatory strategies to ameliorate the gait instability patient is experiencing as a result of double vision.  HTN - permissive hypertension during first 24 hours. Will resume home meds - list from [place] currently pending  Emphysema - established problem Continue Smoking cessation Continue albuterol MDI prn  Cocaine abuse - long standing problem. Social worker consult. Urine drug screen negative for cocaine For agitation/restlessness Ativan q4  GERD - long standing problem. NO report of esophagitis. Continue PPI tx    DVT prophylaxis: Lovenox Code Status: Full code Family Communication: No one at bedside  Disposition Plan:   Status is: Inpatient  Remains inpatient appropriate because:Inpatient level of care appropriate due to severity of illness   Dispo: The patient is from: Home              Anticipated d/c is to: Home              Anticipated d/c date is: 2 days              Patient currently is not medically stable to d/c.   Consultants:   NEUROLOGY  Cardiology  Procedures: MRI head, echocardiogram Antimicrobials:  Anti-infectives (From admission, onward)   None      Subjective: Patient was seen and examined at bedside.  Overnight events noted.  Patient underwent repeat MRI which confirms the presence of punctate stroke involving the right MLF.  Patient continues to have double vision. Objective: Vitals:   10/26/20 1220 10/26/20 1624 10/26/20 1951 10/27/20 0306  BP: Marland Kitchen)  154/78 128/75 136/80 (!) 142/84  Pulse: (!) 59 (!) 55 (!) 57 (!) 54  Resp: 18 18 19 20   Temp: 97.7 F (36.5 C) 97.9 F (36.6 C) 98.6 F (37 C) 98.1 F (36.7 C)  TempSrc:   Oral Oral  SpO2: 96% 96% 98% 96%  Weight:    83.6 kg    Intake/Output Summary (Last 24 hours) at 10/27/2020 1435 Last data filed at  10/27/2020 1015 Gross per 24 hour  Intake 480.82 ml  Output 1525 ml  Net -1044.18 ml   Filed Weights   10/25/20 0318 10/27/20 0306  Weight: 84.9 kg 83.6 kg    Examination:  General exam: Appears calm and comfortable , not in any acute distress Respiratory system: Clear to auscultation. Respiratory effort normal. Cardiovascular system: S1 & S2 heard, RRR. No JVD, murmurs, rubs, gallops or clicks. No pedal edema. Gastrointestinal system: Abdomen is nondistended, soft and nontender. No organomegaly or masses felt. Normal bowel sounds heard. Central nervous system: Alert and oriented. No focal neurological deficits. Extremities: Symmetric 5 x 5 power.  No edema, no clubbing, no cyanosis Skin: No rashes, lesions or ulcers Psychiatry: Judgement and insight appear normal. Mood & affect appropriate.     Data Reviewed: I have personally reviewed following labs and imaging studies  CBC: Recent Labs  Lab 10/23/20 1210 10/25/20 0619 10/26/20 0501  WBC 6.3 5.7 5.3  NEUTROABS 3.3  --   --   HGB 13.6 13.3 13.3  HCT 42.8 40.1 39.7  MCV 86.3 83.9 83.6  PLT 217 210 [id number]   Basic Metabolic Panel: Recent Labs  Lab 10/23/20 1210 10/25/20 0619 10/26/20 0501  NA 136 136 137  K 3.3* 3.8 4.6  CL 106 101 104  CO2 25 26 26   GLUCOSE 94 98 96  BUN 18 19 20   CREATININE 0.98 0.99 1.20  CALCIUM 7.8* 8.9 8.9  MG  --  2.0 1.9  PHOS  --  3.6 3.7   GFR: CrCl cannot be calculated (Unknown ideal weight.). Liver Function Tests: Recent Labs  Lab 10/23/20 1210  AST 25  ALT 15  ALKPHOS 56  BILITOT 0.4  PROT 6.6  ALBUMIN 2.8*   No results for input(s): LIPASE, AMYLASE in the last 168 hours. No results for input(s): AMMONIA in the last 168 hours. Coagulation Profile: Recent Labs  Lab 10/23/20 1210  INR 1.1   Cardiac Enzymes: No results for input(s): CKTOTAL, CKMB, CKMBINDEX, TROPONINI in the last 168 hours. BNP (last 3 results) No results for input(s): PROBNP in the last 8760  hours. HbA1C: Recent Labs    10/26/20 0501  HGBA1C 5.6   CBG: Recent Labs  Lab 10/23/20 1125  GLUCAP 83   Lipid Profile: No results for input(s): CHOL, HDL, LDLCALC, TRIG, CHOLHDL, LDLDIRECT in the last 72 hours. Thyroid Function Tests: No results for input(s): TSH, T4TOTAL, FREET4, T3FREE, THYROIDAB in the last 72 hours. Anemia Panel: No results for input(s): VITAMINB12, FOLATE, FERRITIN, TIBC, IRON, RETICCTPCT in the last 72 hours. Sepsis Labs: No results for input(s): PROCALCITON, LATICACIDVEN in the last 168 hours.  Recent Results (from the past 240 hour(s))  Resp Panel by RT-PCR (Flu A&B, Covid) Nasopharyngeal Swab     Status: None   Collection Time: 10/23/20 12:10 PM   Specimen: Nasopharyngeal Swab; Nasopharyngeal(NP) swabs in vial transport medium  Result Value Ref Range Status   SARS Coronavirus 2 by RT PCR NEGATIVE NEGATIVE Final    Comment: (NOTE) SARS-CoV-2 target nucleic acids are NOT DETECTED.  The SARS-CoV-2 RNA is generally detectable in upper respiratory specimens during the acute phase of infection. The lowest concentration of SARS-CoV-2 viral copies this assay can detect is 138 copies/mL. A negative result does not preclude SARS-Cov-2 infection and should not be used as the sole basis for treatment or other patient management decisions. A negative result may occur with  improper specimen collection/handling, submission of specimen other than nasopharyngeal swab, presence of viral mutation(s) within the areas targeted by this assay, and inadequate number of viral copies(<138 copies/mL). A negative result must be combined with clinical observations, patient history, and epidemiological information. The expected result is Negative.  Fact Sheet for Patients:  EntrepreneurPulse.com.au  Fact Sheet for Healthcare Providers:  IncredibleEmployment.be  This test is no t yet approved or cleared by the [place] FDA and   has been authorized for detection and/or diagnosis of SARS-CoV-2 by FDA under an Emergency Use Authorization (EUA). This EUA will remain  in effect (meaning this test can be used) for the duration of the COVID-19 declaration under Section 564(b)(1) of the Act, 21 U.S.C.section 360bbb-3(b)(1), unless the authorization is terminated  or revoked sooner.       Influenza A by PCR NEGATIVE NEGATIVE Final   Influenza B by PCR NEGATIVE NEGATIVE Final    Comment: (NOTE) The Xpert Xpress SARS-CoV-2/FLU/RSV plus assay is intended as an aid in the diagnosis of influenza from Nasopharyngeal swab specimens and should not be used as a sole basis for treatment. Nasal washings and aspirates are unacceptable for Xpert Xpress SARS-CoV-2/FLU/RSV testing.  Fact Sheet for Patients: EntrepreneurPulse.com.au  Fact Sheet for Healthcare Providers: IncredibleEmployment.be  This test is not yet approved or cleared by the [place] FDA and has been authorized for detection and/or diagnosis of SARS-CoV-2 by FDA under an Emergency Use Authorization (EUA). This EUA will remain in effect (meaning this test can be used) for the duration of the COVID-19 declaration under Section 564(b)(1) of the Act, 21 U.S.C. section 360bbb-3(b)(1), unless the authorization is terminated or revoked.  Performed at [facility], [address] [place], [place] [address]          Radiology Studies: MR BRAIN WO CONTRAST  Result Date: 10/26/2020 CLINICAL DATA:  Stroke follow-up. EXAM: MRI HEAD WITHOUT CONTRAST TECHNIQUE: Multiplanar, multiecho pulse sequences of the brain and surrounding structures were obtained without intravenous contrast. COMPARISON:  MRI of the brain October 23, 2020. FINDINGS: Brain: Two punctate foci of restricted diffusion are seen in the brainstem, one within the mesencephalic tegmentum on the right (series 5, image 19) and the second within the dorsal  pons at the level of the right facial colliculus (series 5, image 15). No other focus of restricted diffusion. No hemorrhage, hydrocephalus, extra-axial collection or mass lesion. Scattered foci of T2 hyperintensity are seen within the white matter of the cerebral hemispheres, nonspecific, most likely related to chronic small vessel ischemia. Vascular: Normal flow voids. Skull and upper cervical spine: Postsurgical changes from spinal fusion. No focal marrow lesion identified. Sinuses/Orbits: Mild mucosal thickening of the ethmoid cells. The orbits are maintained. IMPRESSION: 1. Two punctate foci of restricted diffusion in the brainstem, one within the mesencephalic tegmentum on the right and the second within the dorsal pons at the level of the right facial colliculus. 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 2. Mild chronic small vessel ischemia. Electronically Signed   By: Pedro Earls M.D.   On: 10/26/2020 19:27   ECHO TEE  Result Date: 10/26/2020  TRANSESOPHOGEAL ECHO REPORT   Patient Name:   Timothy Matthews Date of Exam: 10/26/2020 Medical Rec #:  [id number]         Height:       70.0 in Accession #:    [id number]        Weight:       187.1 lb Date of Birth:  1946/10/03          BSA:          2.029 m Patient Age:    63 years          BP:           146/72 mmHg Patient Gender: M                 HR:           50 bpm. Exam Location:  [facility] Procedure: Transesophageal Echo, Cardiac Doppler, Color Doppler and Saline            Contrast Bubble Study Indications:     TIA 435.9/ G 45.9  History:         Patient has prior history of Echocardiogram examinations, most                  recent 10/24/2020. Risk Factors:Hypertension.  Sonographer:     Sherrie Sport RDCS (AE) Referring Phys:  [place] Diagnosing Phys: Bartholome Bill MD PROCEDURE: TEE procedure time was 11 minutes. The transesophogeal probe was passed without difficulty through the esophogus of the patient. Imaged were obtained  with the patient in a left lateral decubitus position. Sedation performed by different physician. The patient was monitored while under deep sedation. Anesthestetic sedation was provided intravenously by Anesthesiology: 120mg  of Propofol. Image quality was excellent. The patient's vital signs; including heart rate, blood pressure, and oxygen saturation; remained stable throughout the procedure. The patient developed no complications during the procedure. IMPRESSIONS  1. Left ventricular ejection fraction, by estimation, is 50 to 55%. The left ventricle has low normal function. The left ventricle has no regional wall motion abnormalities. Left ventricular diastolic parameters were normal.  2. Right ventricular systolic function is normal. The right ventricular size is normal.  3. No left atrial/left atrial appendage thrombus was detected.  4. The mitral valve is grossly normal. Trivial mitral valve regurgitation.  5. The aortic valve is tricuspid. Aortic valve regurgitation is trivial. FINDINGS  Left Ventricle: Left ventricular ejection fraction, by estimation, is 50 to 55%. The left ventricle has low normal function. The left ventricle has no regional wall motion abnormalities. The left ventricular internal cavity size was normal in size. There is no left ventricular hypertrophy. Left ventricular diastolic parameters were normal. Right Ventricle: The right ventricular size is normal. No increase in right ventricular wall thickness. Right ventricular systolic function is normal. Left Atrium: Left atrial size was normal in size. No left atrial/left atrial appendage thrombus was detected. Right Atrium: Right atrial size was normal in size. Pericardium: There is no evidence of pericardial effusion. Mitral Valve: The mitral valve is grossly normal. Trivial mitral valve regurgitation. There is no evidence of mitral valve vegetation. Tricuspid Valve: The tricuspid valve is grossly normal. Tricuspid valve regurgitation is  mild. There is no evidence of tricuspid valve vegetation. Aortic Valve: The aortic valve is tricuspid. Aortic valve regurgitation is trivial. There is no evidence of aortic valve vegetation. Pulmonic Valve: The pulmonic valve was not well visualized. Pulmonic valve regurgitation is trivial. Aorta: The aortic root is  normal in size and structure. IAS/Shunts: No atrial level shunt detected by color flow Doppler. Agitated saline contrast was given intravenously to evaluate for intracardiac shunting. Bartholome Bill MD Electronically signed by Bartholome Bill MD Signature Date/Time: 10/26/2020/12:46:56 PM    Final    Scheduled Meds: .  stroke: mapping our early stages of recovery book   Does not apply Once  . amLODipine  10 mg Oral Daily  . aspirin EC  325 mg Oral Daily  . enoxaparin (LOVENOX) injection  40 mg Subcutaneous Q24H  . gabapentin  300 mg Oral TID  . loratadine  10 mg Oral Daily  . pantoprazole  40 mg Oral Daily  . senna-docusate  1 tablet Oral QHS  . tamsulosin  0.8 mg Oral Daily   Continuous Infusions: . sodium chloride Stopped (10/26/20 1639)     LOS: 2 days    Time spent: 25 mins.    Shawna Clamp, MD [facility]   If 7PM-7AM, please contact night-coverage

## 2020-10-28 DIAGNOSIS — I10 Essential (primary) hypertension: Secondary | ICD-10-CM | POA: Diagnosis not present

## 2020-10-28 LAB — BASIC METABOLIC PANEL
Anion gap: 9 (ref 5–15)
BUN: 29 mg/dL — ABNORMAL HIGH (ref 8–23)
CO2: 26 mmol/L (ref 22–32)
Calcium: 9.1 mg/dL (ref 8.9–10.3)
Chloride: 100 mmol/L (ref 98–111)
Creatinine, Ser: 1.16 mg/dL (ref 0.61–1.24)
GFR, Estimated: >60 mL/min (ref >60)
Glucose, Bld: 129 mg/dL — ABNORMAL HIGH (ref 70–99)
Potassium: 4.6 mmol/L (ref 3.5–5.1)
Sodium: 135 mmol/L (ref 135–145)

## 2020-10-28 LAB — MAGNESIUM: Magnesium: 1.9 mg/dL (ref 1.7–2.4)

## 2020-10-28 LAB — PHOSPHORUS: Phosphorus: 4.4 mg/dL (ref 2.5–4.6)

## 2020-10-28 MED ORDER — MELATONIN 3 MG PO TABS
3.0000 mg | ORAL_TABLET | Freq: Every evening | ORAL | Status: DC | PRN
  Administered 2020-10-28 – 2020-10-29 (×2): 3 mg via ORAL
  Filled 2020-10-28 (×3): qty 1

## 2020-10-28 MED ORDER — CYCLOBENZAPRINE HCL 10 MG PO TABS
10.0000 mg | ORAL_TABLET | Freq: Once | ORAL | Status: AC
  Administered 2020-10-28: 10 mg via ORAL
  Filled 2020-10-28: qty 1

## 2020-10-28 MED ORDER — LIDOCAINE 5 % EX PTCH
1.0000 | MEDICATED_PATCH | Freq: Every day | CUTANEOUS | Status: DC | PRN
  Administered 2020-10-28: 1 via TRANSDERMAL
  Filled 2020-10-28 (×2): qty 1

## 2020-10-28 NOTE — Progress Notes (Addendum)
PROGRESS NOTE    Timothy Matthews  OIN:[id number] DOB: 1946-01-14 DOA: 10/23/2020 PCP: Will Bonnet, MD   Brief Narrative:  This 75 years old male with PMH significant for hypertension, prostate cancer, emphysema, cocaine abuse, PTSD who presented to the ED with acute onset of vertigo, unsteadiness and double vision.  Patient was admitted to rule out a stroke.  CT head without acute finding.  CTA head and neck without LVO,  mild segmental irregularity.  Patient was evaluated by neurology,  offered tPA but patient declined.  MRI head without any acute abnormalities.  Patient continues to have double vision, neurology recommended TEE to rule out mural emboli.  Cardiology consulted, patient underwent TEE which was unremarkable with no source of cardiac emboli noted. Patient continues to have double vision and unsteadiness.  Repeat MRI brain confirms the presence of punctate stroke involving the right MLF.  Assessment & Plan:   Active Problems:   CVA (cerebral vascular accident) ([place])   Cocaine abuse ([place])   HTN (hypertension)   Emphysema lung ([facility])   GERD (gastroesophageal reflux disease)   Vertigo, unsteadiness and double vision could be secondary to CVA -  Patient was admitted for stroke work-up.  Neurology consulted. most likely brainstem event vs cerebellar event. Pt offered and declined TPA. Continue telemetry PT/OT and speech evaluation. MRI: no acute abnormalities noted, patient continues to have double vision. TTE: LVEF 60 to 65%, no regional wall motion abnormality. Neurology recommended TEE to rule out mural emboli. Cardiology consulted,  scheduled to have TEE 1/14. TEE completed, no cardiac source of emboli. Had no evidence of right to left shunt. No vegetation or thrombi of left heart valves. Etiology of diplopia is unclear but does not appear to be secondary to cardiac abnormalities as per cardiology. Continue aspirin and statin Smoking cessation, cocaine  cessation. Repeat MRI brain yesterday confirms the presence of a punctate stroke involving the right MLF. Neurology recommended PT and OT to specifically address therapy for compensatory strategies to ameliorate the gait instability patient is experiencing as a result of double vision. Continue Telemetry, may need loop recorder or Holter monitor to check for arrhythmias.  HTN - Controlled. permissive hypertension during first 24 hours. Will resume home meds - list from [place] currently pending  Emphysema - established problem Continue Smoking cessation Continue albuterol MDI prn  Cocaine abuse - long standing problem. Social worker consult. Urine drug screen negative for cocaine For agitation/restlessness Ativan q4  GERD - long standing problem. NO report of esophagitis. Continue PPI tx    DVT prophylaxis: Lovenox Code Status: Full code Family Communication: No one at bedside  Disposition Plan:   Status is: Inpatient  Remains inpatient appropriate because:Inpatient level of care appropriate due to severity of illness   Dispo: The patient is from: Home              Anticipated d/c is to: Home              Anticipated d/c date is: 2 days              Patient currently is not medically stable to d/c.   Consultants:   NEUROLOGY  Cardiology  Procedures: MRI head, echocardiogram Antimicrobials:  Anti-infectives (From admission, onward)   None      Subjective: Patient was seen and examined at bedside.  Overnight events noted.   Patient continues to have double vision.  MRI shows evidence of brainstem stroke.  Denies any headache or chest pain, shortness  of breath. Objective: Vitals:   10/27/20 1711 10/27/20 2040 10/28/20 0507 10/28/20 0945  BP: 112/72 137/73 138/90 131/77  Pulse: (!) 57 62 61 69  Resp: 18 18 18 18   Temp: 98.4 F (36.9 C) 98.2 F (36.8 C) 98.2 F (36.8 C) 98.8 F (37.1 C)  TempSrc: Oral Oral  Oral  SpO2: 97% 97% 100% 97%  Weight:   85.1 kg      Intake/Output Summary (Last 24 hours) at 10/28/2020 1242 Last data filed at 10/28/2020 0945 Gross per 24 hour  Intake 720 ml  Output 700 ml  Net 20 ml   Filed Weights   10/25/20 0318 10/27/20 0306 10/28/20 0507  Weight: 84.9 kg 83.6 kg 85.1 kg    Examination:  General exam: Appears calm and comfortable , not in any acute distress Respiratory system: Clear to auscultation. Respiratory effort normal. Cardiovascular system: S1 & S2 heard, RRR. No JVD, murmurs, rubs, gallops or clicks. No pedal edema. Gastrointestinal system: Abdomen is nondistended, soft and nontender. No organomegaly or masses felt. Normal bowel sounds heard. Central nervous system: Alert and oriented. No focal neurological deficits. Extremities: Symmetric 5 x 5 power.  No edema, no clubbing, no cyanosis Skin: No rashes, lesions or ulcers Psychiatry: Judgement and insight appear normal. Mood & affect appropriate.     Data Reviewed: I have personally reviewed following labs and imaging studies  CBC: Recent Labs  Lab 10/23/20 1210 10/25/20 0619 10/26/20 0501  WBC 6.3 5.7 5.3  NEUTROABS 3.3  --   --   HGB 13.6 13.3 13.3  HCT 42.8 40.1 39.7  MCV 86.3 83.9 83.6  PLT 217 210 [id number]   Basic Metabolic Panel: Recent Labs  Lab 10/23/20 1210 10/25/20 0619 10/26/20 0501  NA 136 136 137  K 3.3* 3.8 4.6  CL 106 101 104  CO2 25 26 26   GLUCOSE 94 98 96  BUN 18 19 20   CREATININE 0.98 0.99 1.20  CALCIUM 7.8* 8.9 8.9  MG  --  2.0 1.9  PHOS  --  3.6 3.7   GFR: CrCl cannot be calculated (Unknown ideal weight.). Liver Function Tests: Recent Labs  Lab 10/23/20 1210  AST 25  ALT 15  ALKPHOS 56  BILITOT 0.4  PROT 6.6  ALBUMIN 2.8*   No results for input(s): LIPASE, AMYLASE in the last 168 hours. No results for input(s): AMMONIA in the last 168 hours. Coagulation Profile: Recent Labs  Lab 10/23/20 1210  INR 1.1   Cardiac Enzymes: No results for input(s): CKTOTAL, CKMB, CKMBINDEX, TROPONINI in the  last 168 hours. BNP (last 3 results) No results for input(s): PROBNP in the last 8760 hours. HbA1C: Recent Labs    10/26/20 0501  HGBA1C 5.6   CBG: Recent Labs  Lab 10/23/20 1125  GLUCAP 83   Lipid Profile: No results for input(s): CHOL, HDL, LDLCALC, TRIG, CHOLHDL, LDLDIRECT in the last 72 hours. Thyroid Function Tests: No results for input(s): TSH, T4TOTAL, FREET4, T3FREE, THYROIDAB in the last 72 hours. Anemia Panel: No results for input(s): VITAMINB12, FOLATE, FERRITIN, TIBC, IRON, RETICCTPCT in the last 72 hours. Sepsis Labs: No results for input(s): PROCALCITON, LATICACIDVEN in the last 168 hours.  Recent Results (from the past 240 hour(s))  Resp Panel by RT-PCR (Flu A&B, Covid) Nasopharyngeal Swab     Status: None   Collection Time: 10/23/20 12:10 PM   Specimen: Nasopharyngeal Swab; Nasopharyngeal(NP) swabs in vial transport medium  Result Value Ref Range Status   SARS Coronavirus 2 by RT PCR  NEGATIVE NEGATIVE Final    Comment: (NOTE) SARS-CoV-2 target nucleic acids are NOT DETECTED.  The SARS-CoV-2 RNA is generally detectable in upper respiratory specimens during the acute phase of infection. The lowest concentration of SARS-CoV-2 viral copies this assay can detect is 138 copies/mL. A negative result does not preclude SARS-Cov-2 infection and should not be used as the sole basis for treatment or other patient management decisions. A negative result may occur with  improper specimen collection/handling, submission of specimen other than nasopharyngeal swab, presence of viral mutation(s) within the areas targeted by this assay, and inadequate number of viral copies(<138 copies/mL). A negative result must be combined with clinical observations, patient history, and epidemiological information. The expected result is Negative.  Fact Sheet for Patients:  EntrepreneurPulse.com.au  Fact Sheet for Healthcare Providers:   IncredibleEmployment.be  This test is no t yet approved or cleared by the [place] FDA and  has been authorized for detection and/or diagnosis of SARS-CoV-2 by FDA under an Emergency Use Authorization (EUA). This EUA will remain  in effect (meaning this test can be used) for the duration of the COVID-19 declaration under Section 564(b)(1) of the Act, 21 U.S.C.section 360bbb-3(b)(1), unless the authorization is terminated  or revoked sooner.       Influenza A by PCR NEGATIVE NEGATIVE Final   Influenza B by PCR NEGATIVE NEGATIVE Final    Comment: (NOTE) The Xpert Xpress SARS-CoV-2/FLU/RSV plus assay is intended as an aid in the diagnosis of influenza from Nasopharyngeal swab specimens and should not be used as a sole basis for treatment. Nasal washings and aspirates are unacceptable for Xpert Xpress SARS-CoV-2/FLU/RSV testing.  Fact Sheet for Patients: EntrepreneurPulse.com.au  Fact Sheet for Healthcare Providers: IncredibleEmployment.be  This test is not yet approved or cleared by the [place] FDA and has been authorized for detection and/or diagnosis of SARS-CoV-2 by FDA under an Emergency Use Authorization (EUA). This EUA will remain in effect (meaning this test can be used) for the duration of the COVID-19 declaration under Section 564(b)(1) of the Act, 21 U.S.C. section 360bbb-3(b)(1), unless the authorization is terminated or revoked.  Performed at [facility], [address] [place], [place] [address]          Radiology Studies: MR BRAIN WO CONTRAST  Result Date: 10/26/2020 CLINICAL DATA:  Stroke follow-up. EXAM: MRI HEAD WITHOUT CONTRAST TECHNIQUE: Multiplanar, multiecho pulse sequences of the brain and surrounding structures were obtained without intravenous contrast. COMPARISON:  MRI of the brain October 23, 2020. FINDINGS: Brain: Two punctate foci of restricted diffusion are seen in  the brainstem, one within the mesencephalic tegmentum on the right (series 5, image 19) and the second within the dorsal pons at the level of the right facial colliculus (series 5, image 15). No other focus of restricted diffusion. No hemorrhage, hydrocephalus, extra-axial collection or mass lesion. Scattered foci of T2 hyperintensity are seen within the white matter of the cerebral hemispheres, nonspecific, most likely related to chronic small vessel ischemia. Vascular: Normal flow voids. Skull and upper cervical spine: Postsurgical changes from spinal fusion. No focal marrow lesion identified. Sinuses/Orbits: Mild mucosal thickening of the ethmoid cells. The orbits are maintained. IMPRESSION: 1. Two punctate foci of restricted diffusion in the brainstem, one within the mesencephalic tegmentum on the right and the second within the dorsal pons at the level of the right facial colliculus. 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 2. Mild chronic small vessel ischemia. Electronically Signed   By: Sandre Kitty.D.  On: 10/26/2020 19:27   Scheduled Meds: .  stroke: mapping our early stages of recovery book   Does not apply Once  . amLODipine  10 mg Oral Daily  . aspirin EC  325 mg Oral Daily  . enoxaparin (LOVENOX) injection  40 mg Subcutaneous Q24H  . gabapentin  300 mg Oral TID  . loratadine  10 mg Oral Daily  . pantoprazole  40 mg Oral Daily  . senna-docusate  1 tablet Oral QHS  . tamsulosin  0.8 mg Oral Daily   Continuous Infusions:    LOS: 3 days    Time spent: 25 mins.    Shawna Clamp, MD [facility]   If 7PM-7AM, please contact night-coverage

## 2020-10-28 NOTE — TOC Progression Note (Signed)
Transition of Care [facility]) - Progression Note    Patient Details  Name: FILOMENO CROMLEY MRN: [id number] Date of Birth: 1946/07/11  [facility] CM/SW Contact  Izola Price, RN Phone Number: 10/28/2020, 5:00 PM  Clinical Narrative:    10/28/20 1700 See PT note regarding SNF vs Home with certain family support and occlusion glasses requirements for balance issues. ~Dc 2 days as of today.  OT rec was SNF. Provider notes indicate plan is for [place]. TOC to follow up. Simmie Davies RN CM       Expected Discharge Plan and Services                                                 Social Determinants of Health (SDOH) Interventions    Readmission Risk Interventions No flowsheet data found.

## 2020-10-28 NOTE — Progress Notes (Addendum)
Patient having a lot of cramping in arm in legs.  Last blood work done 2 days ago.  Let MD know  MD ordered BMP, Mg and phos labs and one time dose of fexeril 10 mg

## 2020-10-29 DIAGNOSIS — I10 Essential (primary) hypertension: Secondary | ICD-10-CM | POA: Diagnosis not present

## 2020-10-29 LAB — BASIC METABOLIC PANEL
Anion gap: 9 (ref 5–15)
BUN: 32 mg/dL — ABNORMAL HIGH (ref 8–23)
CO2: 26 mmol/L (ref 22–32)
Calcium: 8.7 mg/dL — ABNORMAL LOW (ref 8.9–10.3)
Chloride: 101 mmol/L (ref 98–111)
Creatinine, Ser: 1.26 mg/dL — ABNORMAL HIGH (ref 0.61–1.24)
GFR, Estimated: >60 mL/min (ref >60)
Glucose, Bld: 107 mg/dL — ABNORMAL HIGH (ref 70–99)
Potassium: 4.5 mmol/L (ref 3.5–5.1)
Sodium: 136 mmol/L (ref 135–145)

## 2020-10-29 LAB — CBC
HCT: 42.8 % (ref 39.0–52.0)
Hemoglobin: 14 g/dL (ref 13.0–17.0)
MCH: 27.7 pg (ref 26.0–34.0)
MCHC: 32.7 g/dL (ref 30.0–36.0)
MCV: 84.8 fL (ref 80.0–100.0)
Platelets: 218 10*3/uL (ref 150–400)
RBC: 5.05 MIL/uL (ref 4.22–5.81)
RDW: 14.6 % (ref 11.5–15.5)
WBC: 6.8 10*3/uL (ref 4.0–10.5)
nRBC: 0 % (ref 0.0–0.2)

## 2020-10-29 LAB — PHOSPHORUS: Phosphorus: 4.3 mg/dL (ref 2.5–4.6)

## 2020-10-29 LAB — MAGNESIUM: Magnesium: 1.9 mg/dL (ref 1.7–2.4)

## 2020-10-29 NOTE — Progress Notes (Signed)
Occupational Therapy Treatment Patient Details Name: Timothy Matthews MRN: [id number] DOB: [birth year] Today's Date: 10/29/2020    History of present illness Per MD notes: Pt is a 75 y.o. male with medical history significant of HTN, Hep C, cervical spine Sx, prostate cancer, emphysema, cocaine abuse, PTSD who presents to the ED with acute onset of vertigo, gait unsteadiness and double vision.  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   OT comments  Upon entering the room, pt supine in bed and is agreeable to shower this session. Pt performing bed mobility without assistance. Pt standing with supervision and ambulating to bathroom with close supervision - min guard without use of AD. OT covered IV and removed telemetry. Pt stepping into shower with min guard and bathing while holding onto grab bar with 1 UE for support. Pt did not need assistance for bathing tasks and exits shower to return to bed for dressing. Set up A to don pull over shirt. Telemetry returned and activated. Pt utilized figure four position to thread pants and briefs with min A for balance during clothing management in standing. Pt standing at sink for grooming tasks with close supervision. Pt returning to bed level to rest with all needs within reach. Pt continues to benefit from OT intervention. Pt continues to have diplopia throughout and closes L eye during session with self care tasks. Pt has removed occlusion from glasses placed last session.    Follow Up Recommendations  SNF;Supervision - Intermittent    Equipment Recommendations  Other (comment) (defer to next venue of care)       Precautions / Restrictions Precautions Precautions: Fall Restrictions Weight Bearing Restrictions: No       Mobility Bed Mobility Overal bed mobility: Independent                Transfers Overall transfer level: Needs assistance Equipment used:  None Transfers: Sit to/from Stand Sit to Stand: Supervision         General transfer comment: min guard for stability at times    Balance Overall balance assessment: Needs assistance Sitting-balance support: Feet supported;No upper extremity supported Sitting balance-Leahy Scale: Good   Postural control: Left lateral lean Standing balance support: No upper extremity supported;During functional activity Standing balance-Leahy Scale: Fair                             ADL either performed or assessed with clinical judgement   ADL Overall ADL's : Needs assistance/impaired     Grooming: Wash/dry hands;Wash/dry face;Oral care;Sitting;Supervision/safety   Upper Body Bathing: Supervision/ safety;Standing   Lower Body Bathing: Supervison/ safety;With adaptive equipment;Sit to/from stand   Upper Body Dressing : Set up;Sitting   Lower Body Dressing: Min guard;Sit to/from stand               Functional mobility during ADLs: Supervision/safety;Min guard General ADL Comments: Pt bathing in shower this session with supervision - min guard and min cuing for safety awareness.     Vision Baseline Vision/History: Wears glasses Wears Glasses: Reading only Patient Visual Report: Diplopia            Cognition Arousal/Alertness: Awake/alert Behavior During Therapy: WFL for tasks assessed/performed Overall Cognitive Status: Within Functional Limits for tasks assessed  Pertinent Vitals/ Pain       Pain Assessment: No/denies pain     Prior Functioning/Environment              Frequency  Min 2X/week        Progress Toward Goals  OT Goals(current goals can now be found in the care plan section)  Progress towards OT goals: Progressing toward goals  Acute Rehab OT Goals Patient Stated Goal: To return home OT Goal Formulation: With patient Time For Goal Achievement:  11/07/20 Potential to Achieve Goals: Good  Plan Discharge plan remains appropriate       AM-PAC OT "6 Clicks" Daily Activity     Outcome Measure   Help from another person eating meals?: None Help from another person taking care of personal grooming?: A Little Help from another person toileting, which includes using toliet, bedpan, or urinal?: A Little Help from another person bathing (including washing, rinsing, drying)?: A Little Help from another person to put on and taking off regular upper body clothing?: None Help from another person to put on and taking off regular lower body clothing?: A Little 6 Click Score: 20    End of Session    OT Visit Diagnosis: Unsteadiness on feet (R26.81);Muscle weakness (generalized) (M62.81)   Activity Tolerance Patient tolerated treatment well   Patient Left in bed;with call bell/phone within reach   Nurse Communication Mobility status        Time: 6415-8309 OT Time Calculation (min): 42 min  Charges: OT General Charges $OT Visit: 1 Visit OT Treatments $Self Care/Home Management : 38-52 mins  Darleen Crocker, MS, OTR/L , CBIS ascom [phone]  10/29/20, 12:24 PM

## 2020-10-29 NOTE — Care Management Important Message (Signed)
Important Message  Patient Details  Name: Timothy Matthews MRN: [id number] Date of Birth: 1946-06-21   Medicare Important Message Given:  Yes     Dannette Barbara 10/29/2020, 12:20 PM

## 2020-10-29 NOTE — Plan of Care (Signed)
°  Problem: Education: Goal: Knowledge of secondary prevention will improve Outcome: Progressing   Problem: Coping: Goal: Will identify appropriate support needs Outcome: Progressing   Problem: Ischemic Stroke/TIA Tissue Perfusion: Goal: Complications of ischemic stroke/TIA will be minimized Outcome: Progressing

## 2020-10-29 NOTE — Progress Notes (Signed)
PROGRESS NOTE    Timothy Matthews  QQP:[id number] DOB: Mar 12, 1946 DOA: 10/23/2020 PCP: Will Bonnet, MD   Brief Narrative:  This 75 years old male with PMH significant for hypertension, prostate cancer, emphysema, cocaine abuse, PTSD who presented to the ED with acute onset of vertigo, unsteadiness and double vision.  Patient was admitted to rule out a stroke.  CT head without acute finding.  CTA head and neck without LVO,  mild segmental irregularity.  Patient was evaluated by neurology,  offered tPA but patient declined.  MRI head without any acute abnormalities.  Patient continues to have double vision, neurology recommended TEE to rule out mural emboli.  Cardiology consulted, patient underwent TEE which was unremarkable with no source of cardiac emboli noted. Patient continues to have double vision and unsteadiness.  Repeat MRI brain confirms the presence of punctate stroke involving the right MLF.  Assessment & Plan:   Active Problems:   CVA (cerebral vascular accident) ([place])   Cocaine abuse ([place])   HTN (hypertension)   Emphysema lung ([facility])   GERD (gastroesophageal reflux disease)   Vertigo, unsteadiness and double vision could be secondary to CVA -  Patient was admitted for stroke work-up.  Neurology consulted. most likely brainstem event vs cerebellar event. Pt offered and declined TPA. Continue telemetry PT/OT and speech evaluation. MRI: no acute abnormalities noted, patient continues to have double vision. TTE: LVEF 60 to 65%, no regional wall motion abnormality. Neurology recommended TEE to rule out mural emboli. Cardiology consulted,  scheduled to have TEE 1/14. TEE completed, no cardiac source of emboli. Had no evidence of right to left shunt. No vegetation or thrombi of left heart valves. Etiology of diplopia is unclear but does not appear to be secondary to cardiac abnormalities as per cardiology. Continue aspirin and statin Smoking cessation, cocaine  cessation. Repeat MRI brain yesterday confirms the presence of a punctate stroke involving the right MLF. Neurology recommended PT and OT to specifically address therapy for compensatory strategies to ameliorate the gait instability patient is experiencing as a result of double vision. Continue Telemetry, may need loop recorder or Holter monitor to check for arrhythmias.  HTN - Controlled. permissive hypertension during first 24 hours. Will resume home meds - list from [place] currently pending  Emphysema - established problem Continue Smoking cessation Continue albuterol MDI prn  Cocaine abuse - long standing problem. Social worker consult. Urine drug screen negative for cocaine For agitation/restlessness Ativan q4  GERD - long standing problem. NO report of esophagitis. Continue PPI tx    DVT prophylaxis: Lovenox Code Status: Full code Family Communication: No one at bedside  Disposition Plan:   Status is: Inpatient  Remains inpatient appropriate because:Inpatient level of care appropriate due to severity of illness   Dispo: The patient is from: Home              Anticipated d/c is to: SNF               Anticipated d/c date is: 2 days              Patient currently is not medically stable to d/c.   Consultants:   NEUROLOGY  Cardiology  Procedures: MRI head, echocardiogram Antimicrobials:  Anti-infectives (From admission, onward)   None      Subjective: Patient was seen and examined at bedside.  Overnight events noted.   Patient continues to have double vision.  MRI shows evidence of brainstem stroke.  Denies any headache or chest pain,  shortness of breath.  Patient was having breakfast when seen in the morning. Objective: Vitals:   10/28/20 2123 10/29/20 0249 10/29/20 0631 10/29/20 0808  BP: 128/72  129/81 (!) 119/59  Pulse: 61  62 (!) 56  Resp: 18   20  Temp: 98.5 F (36.9 C)  (!) 97.5 F (36.4 C) 97.7 F (36.5 C)  TempSrc: Oral  Oral Oral  SpO2: 98%   97% 99%  Weight:  84.6 kg      Intake/Output Summary (Last 24 hours) at 10/29/2020 1348 Last data filed at 10/29/2020 1000 Gross per 24 hour  Intake 1200 ml  Output 776.2 ml  Net 423.8 ml   Filed Weights   10/27/20 0306 10/28/20 0507 10/29/20 0249  Weight: 83.6 kg 85.1 kg 84.6 kg    Examination:  General exam: Appears calm and comfortable , not in any acute distress Respiratory system: Clear to auscultation. Respiratory effort normal. Cardiovascular system: S1 & S2 heard, RRR. No JVD, murmurs, rubs, gallops or clicks. No pedal edema. Gastrointestinal system: Abdomen is nondistended, soft and nontender. No organomegaly or masses felt. Normal bowel sounds heard. Central nervous system: Alert and oriented. No focal neurological deficits. Extremities: Symmetric 5 x 5 power.  No edema, no clubbing, no cyanosis Skin: No rashes, lesions or ulcers Psychiatry: Judgement and insight appear normal. Mood & affect appropriate.     Data Reviewed: I have personally reviewed following labs and imaging studies  CBC: Recent Labs  Lab 10/23/20 1210 10/25/20 0619 10/26/20 0501 10/29/20 0300  WBC 6.3 5.7 5.3 6.8  NEUTROABS 3.3  --   --   --   HGB 13.6 13.3 13.3 14.0  HCT 42.8 40.1 39.7 42.8  MCV 86.3 83.9 83.6 84.8  PLT 217 210 221 481   Basic Metabolic Panel: Recent Labs  Lab 10/23/20 1210 10/25/20 0619 10/26/20 0501 10/28/20 1659 10/29/20 0300  NA 136 136 137 135 136  K 3.3* 3.8 4.6 4.6 4.5  CL 106 101 104 100 101  CO2 25 26 26 26 26   GLUCOSE 94 98 96 129* 107*  BUN 18 19 20  29* 32*  CREATININE 0.98 0.99 1.20 1.16 1.26*  CALCIUM 7.8* 8.9 8.9 9.1 8.7*  MG  --  2.0 1.9 1.9 1.9  PHOS  --  3.6 3.7 4.4 4.3   GFR: CrCl cannot be calculated (Unknown ideal weight.). Liver Function Tests: Recent Labs  Lab 10/23/20 1210  AST 25  ALT 15  ALKPHOS 56  BILITOT 0.4  PROT 6.6  ALBUMIN 2.8*   No results for input(s): LIPASE, AMYLASE in the last 168 hours. No results for  input(s): AMMONIA in the last 168 hours. Coagulation Profile: Recent Labs  Lab 10/23/20 1210  INR 1.1   Cardiac Enzymes: No results for input(s): CKTOTAL, CKMB, CKMBINDEX, TROPONINI in the last 168 hours. BNP (last 3 results) No results for input(s): PROBNP in the last 8760 hours. HbA1C: No results for input(s): HGBA1C in the last 72 hours. CBG: Recent Labs  Lab 10/23/20 1125  GLUCAP 83   Lipid Profile: No results for input(s): CHOL, HDL, LDLCALC, TRIG, CHOLHDL, LDLDIRECT in the last 72 hours. Thyroid Function Tests: No results for input(s): TSH, T4TOTAL, FREET4, T3FREE, THYROIDAB in the last 72 hours. Anemia Panel: No results for input(s): VITAMINB12, FOLATE, FERRITIN, TIBC, IRON, RETICCTPCT in the last 72 hours. Sepsis Labs: No results for input(s): PROCALCITON, LATICACIDVEN in the last 168 hours.  Recent Results (from the past 240 hour(s))  Resp Panel by RT-PCR (Flu  A&B, Covid) Nasopharyngeal Swab     Status: None   Collection Time: 10/23/20 12:10 PM   Specimen: Nasopharyngeal Swab; Nasopharyngeal(NP) swabs in vial transport medium  Result Value Ref Range Status   SARS Coronavirus 2 by RT PCR NEGATIVE NEGATIVE Final    Comment: (NOTE) SARS-CoV-2 target nucleic acids are NOT DETECTED.  The SARS-CoV-2 RNA is generally detectable in upper respiratory specimens during the acute phase of infection. The lowest concentration of SARS-CoV-2 viral copies this assay can detect is 138 copies/mL. A negative result does not preclude SARS-Cov-2 infection and should not be used as the sole basis for treatment or other patient management decisions. A negative result may occur with  improper specimen collection/handling, submission of specimen other than nasopharyngeal swab, presence of viral mutation(s) within the areas targeted by this assay, and inadequate number of viral copies(<138 copies/mL). A negative result must be combined with clinical observations, patient history, and  epidemiological information. The expected result is Negative.  Fact Sheet for Patients:  EntrepreneurPulse.com.au  Fact Sheet for Healthcare Providers:  IncredibleEmployment.be  This test is no t yet approved or cleared by the [place] FDA and  has been authorized for detection and/or diagnosis of SARS-CoV-2 by FDA under an Emergency Use Authorization (EUA). This EUA will remain  in effect (meaning this test can be used) for the duration of the COVID-19 declaration under Section 564(b)(1) of the Act, 21 U.S.C.section 360bbb-3(b)(1), unless the authorization is terminated  or revoked sooner.       Influenza A by PCR NEGATIVE NEGATIVE Final   Influenza B by PCR NEGATIVE NEGATIVE Final    Comment: (NOTE) The Xpert Xpress SARS-CoV-2/FLU/RSV plus assay is intended as an aid in the diagnosis of influenza from Nasopharyngeal swab specimens and should not be used as a sole basis for treatment. Nasal washings and aspirates are unacceptable for Xpert Xpress SARS-CoV-2/FLU/RSV testing.  Fact Sheet for Patients: EntrepreneurPulse.com.au  Fact Sheet for Healthcare Providers: IncredibleEmployment.be  This test is not yet approved or cleared by the [place] FDA and has been authorized for detection and/or diagnosis of SARS-CoV-2 by FDA under an Emergency Use Authorization (EUA). This EUA will remain in effect (meaning this test can be used) for the duration of the COVID-19 declaration under Section 564(b)(1) of the Act, 21 U.S.C. section 360bbb-3(b)(1), unless the authorization is terminated or revoked.  Performed at [facility], [address] [place], [place] [address]          Radiology Studies: No results found. Scheduled Meds: .  stroke: mapping our early stages of recovery book   Does not apply Once  . amLODipine  10 mg Oral Daily  . aspirin EC  325 mg Oral Daily  . enoxaparin  (LOVENOX) injection  40 mg Subcutaneous Q24H  . gabapentin  300 mg Oral TID  . loratadine  10 mg Oral Daily  . pantoprazole  40 mg Oral Daily  . senna-docusate  1 tablet Oral QHS  . tamsulosin  0.8 mg Oral Daily   Continuous Infusions:    LOS: 4 days    Time spent: 25 mins.    Shawna Clamp, MD [facility]   If 7PM-7AM, please contact night-coverage

## 2020-10-30 DIAGNOSIS — I10 Essential (primary) hypertension: Secondary | ICD-10-CM | POA: Diagnosis not present

## 2020-10-30 LAB — BASIC METABOLIC PANEL
Anion gap: 9 (ref 5–15)
BUN: 31 mg/dL — ABNORMAL HIGH (ref 8–23)
CO2: 25 mmol/L (ref 22–32)
Calcium: 9 mg/dL (ref 8.9–10.3)
Chloride: 102 mmol/L (ref 98–111)
Creatinine, Ser: 1.24 mg/dL (ref 0.61–1.24)
GFR, Estimated: >60 mL/min (ref >60)
Glucose, Bld: 99 mg/dL (ref 70–99)
Potassium: 4.7 mmol/L (ref 3.5–5.1)
Sodium: 136 mmol/L (ref 135–145)

## 2020-10-30 LAB — PHOSPHORUS: Phosphorus: 4.3 mg/dL (ref 2.5–4.6)

## 2020-10-30 LAB — MAGNESIUM: Magnesium: 2.1 mg/dL (ref 1.7–2.4)

## 2020-10-30 MED ORDER — ATORVASTATIN CALCIUM 40 MG PO TABS: 40.0000 mg | ORAL_TABLET | Freq: Every day | ORAL | 1 refills | Status: AC

## 2020-10-30 MED ORDER — ASPIRIN 81 MG PO TBEC: 81.0000 mg | DELAYED_RELEASE_TABLET | Freq: Every day | ORAL | 11 refills | Status: DC

## 2020-10-30 MED ORDER — ASPIRIN EC 81 MG PO TBEC: 81.0000 mg | DELAYED_RELEASE_TABLET | Freq: Every day | ORAL | Status: DC

## 2020-10-30 NOTE — Progress Notes (Signed)
Physical Therapy Treatment Patient Details Name: Timothy Matthews MRN: [id number] DOB: [birth year] Today's Date: 10/30/2020    History of Present Illness Per MD notes: Pt is a 75 y.o. male with medical history significant of HTN, Hep C, cervical spine Sx, prostate cancer, emphysema, cocaine abuse, PTSD who presents to the ED with acute onset of vertigo, gait unsteadiness and double vision.  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    PT Comments    Pt in and OOB without assist.  Stated he is using urinal on his own and occasionally walking to bathroom on his own without difficulty.  Pt is able to ambulate 4 times around unit with RW and min guard/supervision.  He does report some L hip discomfort and stretches leg out while walking.  Pt is progressing well with mobility and meeting goal.  During discharge discussion with pt, pt stated he lives at a local hotel and that his belongings are there now.  He voices that he does not want to go to a "nursing home" and prefers to go back to the motel.  Pt is demonstrating safe mobility with RW at this time.  Discussed with TOC.  If pt has a safe discharge plan at motel, it would be reasonable to return there with supports available to him.  If pt does not have reliable housing, SNF would be appropriate to improve mobility/safey and facilitate appropriate discharge plan.    Follow Up Recommendations  SNF;Supervision for mobility/OOB;Other (comment)  See above.     Equipment Recommendations  Rolling walker with 5" wheels (may be able to progress to [facility])    Recommendations for Other Services       Precautions / Restrictions Precautions Precautions: Fall Restrictions Weight Bearing Restrictions: No    Mobility  Bed Mobility Overal bed mobility: Independent                Transfers Overall transfer level: Independent Equipment used: None Transfers: Sit to/from  Stand Sit to Stand: Independent            Ambulation/Gait Ambulation/Gait assistance: Min guard Gait Distance (Feet): 600 Feet Assistive device: Rolling walker (2 wheeled)   Gait velocity: decreased   General Gait Details: no LOB or buckling   Stairs             Wheelchair Mobility    Modified Rankin (Stroke Patients Only)       Balance Overall balance assessment: Needs assistance Sitting-balance support: Feet supported;No upper extremity supported Sitting balance-Leahy Scale: Normal     Standing balance support: During functional activity;Bilateral upper extremity supported Standing balance-Leahy Scale: Good                              Cognition Arousal/Alertness: Awake/alert Behavior During Therapy: WFL for tasks assessed/performed Overall Cognitive Status: Within Functional Limits for tasks assessed                                        Exercises      General Comments        Pertinent Vitals/Pain Pain Assessment: No/denies pain    Home Living                      Prior Function  PT Goals (current goals can now be found in the care plan section) Progress towards PT goals: Progressing toward goals    Frequency    Min 2X/week      PT Plan Current plan remains appropriate    Co-evaluation              AM-PAC PT "6 Clicks" Mobility   Outcome Measure  Help needed turning from your back to your side while in a flat bed without using bedrails?: None Help needed moving from lying on your back to sitting on the side of a flat bed without using bedrails?: None Help needed moving to and from a bed to a chair (including a wheelchair)?: None Help needed standing up from a chair using your arms (e.g., wheelchair or bedside chair)?: None Help needed to walk in hospital room?: A Little Help needed climbing 3-5 steps with a railing? : A Little 6 Click Score: 22    End of Session  Equipment Utilized During Treatment: Gait belt Activity Tolerance: Patient tolerated treatment well Patient left: in bed;with call bell/phone within reach;Other (comment) Nurse Communication: Mobility status       Time: 8338-2505 PT Time Calculation (min) (ACUTE ONLY): 8 min  Charges:  $Gait Training: 8-22 mins                    Chesley Noon, PTA 10/30/20, 10:11 AM

## 2020-10-30 NOTE — Discharge Instructions (Signed)
Advised to follow-up with neurology Dr. Cheral Marker in 2 weeks. Advised to follow-up with cardiology for outpatient Holter or loop recorder.  Advised to take aspirin 81 mg daily and Lipitor 40 mg daily. Advised to refrain from cocaine use which has caused this stroke.

## 2020-10-30 NOTE — Progress Notes (Signed)
Patient discharged home as per order, discharge information and follow up appoint reviewed with patient at the time of discharge

## 2020-10-30 NOTE — Discharge Summary (Signed)
Physician Discharge Summary  KATRINA KRAUSER [id number] DOB: November 07, 1945 DOA: 10/23/2020  PCP: Will Bonnet, MD  Admit date: 10/23/2020   Discharge date: 10/30/2020  Admitted From: Home.  Disposition: Home.  Recommendations for Outpatient Follow-up:  1. Follow up with PCP in 1-2 weeks. 2. Please obtain BMP/CBC in one week. 3.   Advised to follow-up with Neurology Dr. Cheral Marker in 2 weeks. 4.   Advised to follow-up with Cardiology for outpatient Holter or loop recorder.  5.   Advised to take aspirin 81 mg daily and Lipitor 40 mg daily. 6.   Advised to refrain from cocaine use which has caused this stroke.  Home Health: None. Equipment/Devices: DME Cane  Discharge Condition: Stable CODE STATUS:Full code Diet recommendation: Heart Healthy   Brief Summary / Hospital course: This 75 years old male with PMH significant for hypertension, prostate cancer, emphysema, cocaine abuse, PTSD who presented to the ED with acute onset of vertigo, unsteadiness and double vision.  Patient was admitted to rule out a stroke.  CT head without acute finding.  CTA head and neck without Large Vessel Occlusion,  mild segmental irregularity.  Patient was evaluated by neurology, He was offered tPA but patient declined.  MRI head without any acute abnormalities. Patient continues to have double vision, Neurology recommended TEE to rule out mural emboli. Cardiology was consulted, Patient underwent TEE which was unremarkable with no source of cardiac emboli noted. Patient continues to have double vision and unsteadiness. Neurology recommended repeat MRI brain which confirms the presence of punctate stroke involving the right MLF( Brainstem CVA). Of note the patient has a longstanding history of cocaine abuse.  Vasospasm secondary to effect of cocaine or not however the etiology of his presentation as he states that his last cocaine use was 4 to 5 days prior to the onset of stroke symptoms. Patient continues to  have double vision but which is improving.  Neurology recommended PT and OT to specifically address the compensation strategies to ameliorate the gait instability as a result of double vision.  Advised to continue aspirin 81 mg and outpatient neurology follow-up.  Patient will follow-up with cardiology for outpatient loop recorder or Holter monitor to possibly assess possible paroxysmal A. fib over a longer period of time. PT recommended skilled nursing facility but patient declined,  Patient is being discharged home.  He was managed for below problems.  Discharge Diagnoses:  Active Problems:   CVA (cerebral vascular accident) ([place])   Cocaine abuse ([place])   HTN (hypertension)   Emphysema lung ([facility])   GERD (gastroesophageal reflux disease)  Vertigo, unsteadiness and double vision could be secondary to  Brainstem CVA -  Patient was admitted for stroke work-up.  Neurology consulted. most likely brainstem event vs cerebellar event. Pt offered and declined TPA. Continue telemetry PT/OT and speech evaluation. MRI: no acute abnormalities noted, patient continues to have double vision. TTE: LVEF 60 to 65%, no regional wall motion abnormality. Neurology recommended TEE to rule out mural emboli. Cardiology consulted,  scheduled to have TEE 1/14. TEE completed, no cardiac source of emboli. Had no evidence of right to left shunt. No vegetation or thrombi of left heart valves. Etiology of diplopia is unclear but does not appear to be secondary to cardiac abnormalities as per cardiology. Continue aspirin and statin Smoking cessation, cocaine cessation. Repeat MRI brain yesterday confirms the presence of a punctate stroke involving the right MLF. Neurology recommended PT and OT to specifically address therapy for compensatory strategies to ameliorate the  gait instability patient is experiencing as a result of double vision. Continue Telemetry, may need loop recorder or Holter monitor to check for  arrhythmias. Will need outpatient neurology follow-up.  HTN - Controlled. permissive hypertension during first 24 hours. Will resume home meds - list from [place] currently pending  Emphysema - established problem Continue Smoking cessation Continue albuterol MDI prn  Cocaine abuse - long standing problem. Social worker consult. Urine drug screen negative for cocaine For agitation/restlessness Ativan q4  GERD - long standing problem. NO report of esophagitis. Continue PPI tx  Discharge Instructions  Discharge Instructions    Call MD for:  difficulty breathing, headache or visual disturbances   Complete by: As directed    Call MD for:  persistant dizziness or light-headedness   Complete by: As directed    Call MD for:  persistant nausea and vomiting   Complete by: As directed    Diet - low sodium heart healthy   Complete by: As directed    Diet Carb Modified   Complete by: As directed    Discharge instructions   Complete by: As directed    Advised to follow-up with primary care physician in 1 week. Advised to follow-up with neurology Dr. Cheral Marker in 2 weeks. Advised to follow-up with cardiology for outpatient Holter or loop recorder.  Advised to take aspirin 81 mg daily and Lipitor 40 mg daily.   Increase activity slowly   Complete by: As directed      Allergies as of 10/30/2020   No Known Allergies     Medication List    STOP taking these medications   ibuprofen 800 MG tablet Commonly known as: ADVIL     TAKE these medications   albuterol 108 (90 Base) MCG/ACT inhaler Commonly known as: VENTOLIN HFA Inhale 2 puffs into the lungs every 6 (six) hours as needed for wheezing or shortness of breath.   amLODipine 10 MG tablet Commonly known as: NORVASC Take 10 mg by mouth daily.   aspirin 81 MG EC tablet Take 1 tablet (81 mg total) by mouth daily. Swallow whole.   atorvastatin 40 MG tablet Commonly known as: Lipitor Take 1 tablet (40 mg total) by mouth  daily.   cetirizine 10 MG tablet Commonly known as: ZYRTEC Take 10 mg by mouth daily.   cholecalciferol 25 MCG (1000 UNIT) tablet Commonly known as: VITAMIN D3 Take 2,000 Units by mouth daily.   gabapentin 300 MG capsule Commonly known as: NEURONTIN Take 300 mg by mouth 3 (three) times daily.   sildenafil 100 MG tablet Commonly known as: VIAGRA Take 100 mg by mouth daily as needed for erectile dysfunction.   tamsulosin 0.4 MG Caps capsule Commonly known as: FLOMAX Take 0.8 mg by mouth.            Durable Medical Equipment  (From admission, onward)         Start     Ordered   10/30/20 1142  DME Cane  Once        10/30/20 1142          Follow-up Information    Will Bonnet, MD Follow up in 1 week(s).   Specialty: Internal Medicine Contact information: [place] [address] [phone]        Kerney Elbe, MD Follow up in 2 week(s).   Specialty: Neurology       Teodoro Spray, MD Follow up in 1 week(s).   Specialty: Cardiology Contact information: [id number] [place] [address] [phone]              No Known Allergies  Consultations:  Neurology and cardiology   Procedures/Studies: CT ANGIO HEAD W OR WO CONTRAST  Result Date: 10/23/2020 CLINICAL DATA:  Dizziness.  Blurred vision.  Unsteady gait. EXAM: CT ANGIOGRAPHY HEAD AND NECK TECHNIQUE: Multidetector CT imaging of the head and neck was performed using the standard protocol during bolus administration of intravenous contrast. Multiplanar CT image reconstructions and MIPs were obtained to evaluate the vascular anatomy. Carotid stenosis measurements (when applicable) are obtained utilizing NASCET criteria, using the distal internal carotid diameter as the denominator. CONTRAST:  65mL OMNIPAQUE IOHEXOL 350 MG/ML SOLN COMPARISON:  CT head without contrast 10/23/2020 FINDINGS: CTA NECK FINDINGS Aortic arch: Minimal atherosclerotic changes are present  within the aortic arch. Three vessel arch configuration is present. No aneurysm stenosis is present. Right carotid system: Right common carotid artery is mildly tortuous without significant stenosis. Bifurcation is unremarkable. Cervical right ICA is normal. Left carotid system: The left common carotid artery within normal limits. Bifurcation is unremarkable. Cervical left ICA is within limits. Vertebral arteries: The right vertebral artery is the dominant vessel. Both vertebral arteries originate from the subclavian arteries without significant stenosis. Is no significant stenosis in either vertebral artery in the neck. Skeleton: Anterior fusion is noted C3-7. Posterior hardware extends to the T2 level. Posterior fusion is present through at least T1. There is probable posterior fusion at T1-2. Hardware is intact. No focal lytic or blastic lesions are present. Other neck: Soft tissues the neck are unremarkable. Upper chest: Paraseptal emphysematous changes are noted at the lung apices. Centrilobular emphysematous changes are present as well. No focal nodule or mass lesion is present. The thoracic inlet is within normal limits. Review of the MIP images confirms the above findings CTA HEAD FINDINGS Anterior circulation: The internal carotid arteries are within limits the skull base through the ICA termini. The A1 segments are. The anterior communicating artery is not definitively seen. MCA bifurcations are intact. Is some segmental irregularity without a discrete proximal stenosis branch vessel occlusion. Posterior circulation: The right vertebral artery is dominant. PICA origins are visualized and within normal limits. The vertebrobasilar junction is normal. Basilar artery is normal. Both posterior cerebral arteries originate basilar tip. Right posterior communicating artery contributes. PCA branch vessels are within normal. Venous sinuses: The dural sinuses are patent. Straight sinus deep cerebral veins are intact.  Cortical veins are within normal limits. No vascular malformation present. Anatomic variants: None Review of the MIP images confirms the above findings IMPRESSION: 1. No emergent large vessel occlusion. 2. Mild segmental irregularity of the circle-of-Willis without a discrete proximal stenosis, aneurysm, or branch vessel occlusion. 3. Minimal atherosclerotic changes at the aortic arch without significant stenosis. 4. Mild tortuosity of the cervical vasculature without significant stenosis in the neck. 5. Postoperative changes of the cervical spine as described. 6. Emphysema (ICD10-J43.9). Electronically Signed   By: Marin Roberts M.D.   On: 10/23/2020 12:37   DG Chest 2 View  Result Date: 10/23/2020 CLINICAL DATA:  Left shoulder pain Dizziness Blurry vision EXAM: CHEST - 2 VIEW COMPARISON:  10/23/2020 FINDINGS: Cardiomediastinal silhouette and pulmonary vasculature are within normal limits. Cervical fusion hardware partially visualized. Increased interstitial opacities seen throughout both lungs, unchanged from prior examination suggestive of chronic interstitial pneumonitis. IMPRESSION: No acute cardiopulmonary process. Unchanged increased interstitial opacity seen throughout both lungs. Electronically Signed   By: Acquanetta Belling M.D.   On: 10/23/2020  12:44   CT ANGIO NECK W OR WO CONTRAST  Result Date: 10/23/2020 CLINICAL DATA:  Dizziness.  Blurred vision.  Unsteady gait. EXAM: CT ANGIOGRAPHY HEAD AND NECK TECHNIQUE: Multidetector CT imaging of the head and neck was performed using the standard protocol during bolus administration of intravenous contrast. Multiplanar CT image reconstructions and MIPs were obtained to evaluate the vascular anatomy. Carotid stenosis measurements (when applicable) are obtained utilizing NASCET criteria, using the distal internal carotid diameter as the denominator. CONTRAST:  12mL OMNIPAQUE IOHEXOL 350 MG/ML SOLN COMPARISON:  CT head without contrast 10/23/2020 FINDINGS:  CTA NECK FINDINGS Aortic arch: Minimal atherosclerotic changes are present within the aortic arch. Three vessel arch configuration is present. No aneurysm stenosis is present. Right carotid system: Right common carotid artery is mildly tortuous without significant stenosis. Bifurcation is unremarkable. Cervical right ICA is normal. Left carotid system: The left common carotid artery within normal limits. Bifurcation is unremarkable. Cervical left ICA is within limits. Vertebral arteries: The right vertebral artery is the dominant vessel. Both vertebral arteries originate from the subclavian arteries without significant stenosis. Is no significant stenosis in either vertebral artery in the neck. Skeleton: Anterior fusion is noted C3-7. Posterior hardware extends to the T2 level. Posterior fusion is present through at least T1. There is probable posterior fusion at T1-2. Hardware is intact. No focal lytic or blastic lesions are present. Other neck: Soft tissues the neck are unremarkable. Upper chest: Paraseptal emphysematous changes are noted at the lung apices. Centrilobular emphysematous changes are present as well. No focal nodule or mass lesion is present. The thoracic inlet is within normal limits. Review of the MIP images confirms the above findings CTA HEAD FINDINGS Anterior circulation: The internal carotid arteries are within limits the skull base through the ICA termini. The A1 segments are. The anterior communicating artery is not definitively seen. MCA bifurcations are intact. Is some segmental irregularity without a discrete proximal stenosis branch vessel occlusion. Posterior circulation: The right vertebral artery is dominant. PICA origins are visualized and within normal limits. The vertebrobasilar junction is normal. Basilar artery is normal. Both posterior cerebral arteries originate basilar tip. Right posterior communicating artery contributes. PCA branch vessels are within normal. Venous sinuses:  The dural sinuses are patent. Straight sinus deep cerebral veins are intact. Cortical veins are within normal limits. No vascular malformation present. Anatomic variants: None Review of the MIP images confirms the above findings IMPRESSION: 1. No emergent large vessel occlusion. 2. Mild segmental irregularity of the circle-of-Willis without a discrete proximal stenosis, aneurysm, or branch vessel occlusion. 3. Minimal atherosclerotic changes at the aortic arch without significant stenosis. 4. Mild tortuosity of the cervical vasculature without significant stenosis in the neck. 5. Postoperative changes of the cervical spine as described. 6. Emphysema (ICD10-J43.9). Electronically Signed   By: San Morelle M.D.   On: 10/23/2020 12:37   MR BRAIN WO CONTRAST  Result Date: 10/26/2020 CLINICAL DATA:  Stroke follow-up. EXAM: MRI HEAD WITHOUT CONTRAST TECHNIQUE: Multiplanar, multiecho pulse sequences of the brain and surrounding structures were obtained without intravenous contrast. COMPARISON:  MRI of the brain October 23, 2020. FINDINGS: Brain: Two punctate foci of restricted diffusion are seen in the brainstem, one within the mesencephalic tegmentum on the right (series 5, image 19) and the second within the dorsal pons at the level of the right facial colliculus (series 5, image 15). No other focus of restricted diffusion. No hemorrhage, hydrocephalus, extra-axial collection or mass lesion. Scattered foci of T2 hyperintensity are seen within the  white matter of the cerebral hemispheres, nonspecific, most likely related to chronic small vessel ischemia. Vascular: Normal flow voids. Skull and upper cervical spine: Postsurgical changes from spinal fusion. No focal marrow lesion identified. Sinuses/Orbits: Mild mucosal thickening of the ethmoid cells. The orbits are maintained. IMPRESSION: 1. Two punctate foci of restricted diffusion in the brainstem, one within the mesencephalic tegmentum on the right and the  second within the dorsal pons at the level of the right facial colliculus. 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 2. Mild chronic small vessel ischemia. Electronically Signed   By: Pedro Earls M.D.   On: 10/26/2020 19:27   MR BRAIN WO CONTRAST  Result Date: 10/23/2020 CLINICAL DATA:  Acute neuro deficit. Blurred vision and dizziness. History of prostate cancer. EXAM: MRI HEAD WITHOUT CONTRAST TECHNIQUE: Multiplanar, multiecho pulse sequences of the brain and surrounding structures were obtained without intravenous contrast. COMPARISON:  None. FINDINGS: Brain: Negative for acute infarct. Mild white matter changes bilaterally. Brainstem and cerebellum normal. Negative for hemorrhage or mass. Ventricle size normal. Vascular: Normal arterial flow voids Skull and upper cervical spine: No focal skeletal lesion. Surgical hardware in the posterior cervical spine. Sinuses/Orbits: Paranasal sinuses clear.  Negative orbit Other: None IMPRESSION: Negative for acute infarct. Mild chronic white matter changes bilaterally. Electronically Signed   By: Franchot Gallo M.D.   On: 10/23/2020 15:00   ECHOCARDIOGRAM COMPLETE  Result Date: 10/24/2020    ECHOCARDIOGRAM REPORT   Patient Name:   ALPER NOEL Date of Exam: 10/24/2020 Medical Rec #:  [id number]         Height:       70.0 in Accession #:    [id number]        Weight:       180.0 lb Date of Birth:  14-Dec-1945          BSA:          1.996 m Patient Age:    46 years          BP:           157/89 mmHg Patient Gender: M                 HR:           58 bpm. Exam Location:  [facility] Procedure: 2D Echo, Color Doppler and Cardiac Doppler Indications:     I163.9 Stroke  History:         Patient has no prior history of Echocardiogram examinations.                  Emphysema; Risk Factors:Hypertension.  Sonographer:     Charmayne Sheer RDCS (AE) Referring Phys:  [place] Diagnosing Phys: Kate Sable MD  Sonographer Comments: Suboptimal subcostal  window. Image acquisition challenging due to respiratory motion. IMPRESSIONS  1. Left ventricular ejection fraction, by estimation, is 60 to 65%. The left ventricle has normal function. The left ventricle has no regional wall motion abnormalities. Left ventricular diastolic parameters were normal.  2. Right ventricular systolic function is normal. The right ventricular size is normal.  3. The mitral valve is normal in structure. No evidence of mitral valve regurgitation.  4. The aortic valve is tricuspid. Aortic valve regurgitation is not visualized. Mild to moderate aortic valve sclerosis/calcification is present, without any evidence of aortic stenosis. FINDINGS  Left Ventricle: Left ventricular ejection fraction, by estimation, is 60 to 65%. The left ventricle has normal function. The left ventricle has no  regional wall motion abnormalities. The left ventricular internal cavity size was normal in size. There is  no left ventricular hypertrophy. Left ventricular diastolic parameters were normal. Right Ventricle: The right ventricular size is normal. No increase in right ventricular wall thickness. Right ventricular systolic function is normal. Left Atrium: Left atrial size was normal in size. Right Atrium: Right atrial size was normal in size. Pericardium: There is no evidence of pericardial effusion. Mitral Valve: The mitral valve is normal in structure. No evidence of mitral valve regurgitation. MV peak gradient, 3.2 mmHg. The mean mitral valve gradient is 1.0 mmHg. Tricuspid Valve: The tricuspid valve is normal in structure. Tricuspid valve regurgitation is not demonstrated. Aortic Valve: The aortic valve is tricuspid. Aortic valve regurgitation is not visualized. Mild to moderate aortic valve sclerosis/calcification is present, without any evidence of aortic stenosis. Aortic valve mean gradient measures 5.0 mmHg. Aortic valve peak gradient measures 10.6 mmHg. Aortic valve area, by VTI measures 2.94 cm.  Pulmonic Valve: The pulmonic valve was normal in structure. Pulmonic valve regurgitation is not visualized. Aorta: The aortic root is normal in size and structure. IAS/Shunts: No atrial level shunt detected by color flow Doppler.  LEFT VENTRICLE PLAX 2D LVIDd:         4.40 cm  Diastology LVIDs:         2.60 cm  LV e' medial:    5.44 cm/s LV PW:         1.00 cm  LV E/e' medial:  16.9 LV IVS:        1.00 cm  LV e' lateral:   9.36 cm/s LVOT diam:     2.20 cm  LV E/e' lateral: 9.8 LV SV:         88 LV SV Index:   44 LVOT Area:     3.80 cm  RIGHT VENTRICLE RV Basal diam:  3.60 cm TAPSE (M-mode): 1.8 cm LEFT ATRIUM             Index       RIGHT ATRIUM           Index LA diam:        4.10 cm 2.05 cm/m  RA Area:     20.50 cm LA Vol (A2C):   25.6 ml 12.83 ml/m RA Volume:   62.50 ml  31.31 ml/m LA Vol (A4C):   55.7 ml 27.91 ml/m LA Biplane Vol: 41.9 ml 20.99 ml/m  AORTIC VALVE                    PULMONIC VALVE AV Area (Vmax):    2.71 cm     PV Vmax:       1.55 m/s AV Area (Vmean):   2.92 cm     PV Vmean:      107.000 cm/s AV Area (VTI):     2.94 cm     PV VTI:        0.287 m AV Vmax:           163.00 cm/s  PV Peak grad:  9.6 mmHg AV Vmean:          102.000 cm/s PV Mean grad:  5.0 mmHg AV VTI:            0.300 m AV Peak Grad:      10.6 mmHg AV Mean Grad:      5.0 mmHg LVOT Vmax:         116.00 cm/s LVOT Vmean:  78.400 cm/s LVOT VTI:          0.232 m LVOT/AV VTI ratio: 0.77  AORTA Ao Root diam: 3.60 cm MITRAL VALVE MV Area (PHT): 2.63 cm    SHUNTS MV Area VTI:   2.34 cm    Systemic VTI:  0.23 m MV Peak grad:  3.2 mmHg    Systemic Diam: 2.20 cm MV Mean grad:  1.0 mmHg MV Vmax:       0.89 m/s MV Vmean:      49.0 cm/s MV Decel Time: 288 msec MV E velocity: 92.10 cm/s MV A velocity: 87.00 cm/s MV E/A ratio:  1.06 Kate Sable MD Electronically signed by Kate Sable MD Signature Date/Time: 10/24/2020/3:39:51 PM    Final    ECHO TEE  Result Date: 10/26/2020    TRANSESOPHOGEAL ECHO REPORT   Patient  Name:   BULMARO TALBOTT Date of Exam: 10/26/2020 Medical Rec #:  [id number]         Height:       70.0 in Accession #:    [id number]        Weight:       187.1 lb Date of Birth:  04-Apr-1946          BSA:          2.029 m Patient Age:    36 years          BP:           146/72 mmHg Patient Gender: M                 HR:           50 bpm. Exam Location:  [facility] Procedure: Transesophageal Echo, Cardiac Doppler, Color Doppler and [place]ine            Contrast Bubble Study Indications:     TIA 435.9/ G 45.9  History:         Patient has prior history of Echocardiogram examinations, most                  recent 10/24/2020. Risk Factors:Hypertension.  Sonographer:     Sherrie Sport RDCS (AE) Referring Phys:  [place] Diagnosing Phys: Bartholome Bill MD PROCEDURE: TEE procedure time was 11 minutes. The transesophogeal probe was passed without difficulty through the esophogus of the patient. Imaged were obtained with the patient in a left lateral decubitus position. Sedation performed by different physician. The patient was monitored while under deep sedation. Anesthestetic sedation was provided intravenously by Anesthesiology: 120mg  of Propofol. Image quality was excellent. The patient's vital signs; including heart rate, blood pressure, and oxygen saturation; remained stable throughout the procedure. The patient developed no complications during the procedure. IMPRESSIONS  1. Left ventricular ejection fraction, by estimation, is 50 to 55%. The left ventricle has low normal function. The left ventricle has no regional wall motion abnormalities. Left ventricular diastolic parameters were normal.  2. Right ventricular systolic function is normal. The right ventricular size is normal.  3. No left atrial/left atrial appendage thrombus was detected.  4. The mitral valve is grossly normal. Trivial mitral valve regurgitation.  5. The aortic valve is tricuspid. Aortic valve regurgitation is trivial. FINDINGS  Left Ventricle: Left  ventricular ejection fraction, by estimation, is 50 to 55%. The left ventricle has low normal function. The left ventricle has no regional wall motion abnormalities. The left ventricular internal cavity size was normal in size. There is no left ventricular hypertrophy. Left ventricular  diastolic parameters were normal. Right Ventricle: The right ventricular size is normal. No increase in right ventricular wall thickness. Right ventricular systolic function is normal. Left Atrium: Left atrial size was normal in size. No left atrial/left atrial appendage thrombus was detected. Right Atrium: Right atrial size was normal in size. Pericardium: There is no evidence of pericardial effusion. Mitral Valve: The mitral valve is grossly normal. Trivial mitral valve regurgitation. There is no evidence of mitral valve vegetation. Tricuspid Valve: The tricuspid valve is grossly normal. Tricuspid valve regurgitation is mild. There is no evidence of tricuspid valve vegetation. Aortic Valve: The aortic valve is tricuspid. Aortic valve regurgitation is trivial. There is no evidence of aortic valve vegetation. Pulmonic Valve: The pulmonic valve was not well visualized. Pulmonic valve regurgitation is trivial. Aorta: The aortic root is normal in size and structure. IAS/Shunts: No atrial level shunt detected by color flow Doppler. Agitated saline contrast was given intravenously to evaluate for intracardiac shunting. Bartholome Bill MD Electronically signed by Bartholome Bill MD Signature Date/Time: 10/26/2020/12:46:56 PM    Final    CT HEAD CODE STROKE WO CONTRAST  Addendum Date: 10/23/2020   ADDENDUM REPORT: 10/23/2020 11:47 ADDENDUM: Study discussed by telephone with Dr. Cherylann Banas in the ED on 10/23/2020 at 1144 hours. Electronically Signed   By: Genevie Ann M.D.   On: 10/23/2020 11:47   Result Date: 10/23/2020 CLINICAL DATA:  Code stroke. 75 year old male with blurred vision and dizziness. Hypertensive. EXAM: CT HEAD WITHOUT CONTRAST  TECHNIQUE: Contiguous axial images were obtained from the base of the skull through the vertex without intravenous contrast. COMPARISON:  None. FINDINGS: Brain: Cerebral volume is within normal limits for age. No midline shift, ventriculomegaly, mass effect, evidence of mass lesion, intracranial hemorrhage or evidence of cortically based acute infarction. Patchy and confluent bilateral cerebral white matter hypodensity. Deep gray nuclei appear spared. No cortical encephalomalacia identified. Vascular: No suspicious intracranial vascular hyperdensity. Skull: Negative. Partially visible cervical spine fusion changes on the scout view. Sinuses/Orbits: Visible paranasal sinuses are clear. Tympanic cavities also appear clear. Some chronic sclerotic changes are noted to the mastoids which are relatively well pneumatized. Other: Visualized orbits and scalp soft tissues are within normal limits. ASPECTS [facility] Stroke Program Early CT Score) Total score (0-10 with 10 being normal): 10 IMPRESSION: 1. No acute cortically based infarct or acute intracranial hemorrhage identified. ASPECTS 10. 2. 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 Electronically Signed: By: Genevie Ann M.D. On: 10/23/2020 11:40    Echocardiogram, TEE, MRI, CT head.   Subjective: Patient was seen and examined at bedside.  Overnight events noted.  Patient reports slight improvement in double vision but is still persistent.  He is able to walk with a cane,  patient feels fine and want to be discharged.  Cleared from neurology and cardiology to be discharged.  Discharge Exam: Vitals:   10/30/20 0427 10/30/20 0810  BP: 134/81 140/70  Pulse: (!) 59 60  Resp: 17 18  Temp: 98.6 F (37 C) 98.3 F (36.8 C)  SpO2: 97% 98%   Vitals:   10/29/20 0808 10/29/20 2016 10/30/20 0427 10/30/20 0810  BP: (!) 119/59 (!) 142/78 134/81 140/70  Pulse: (!) 56 62 (!) 59 60  Resp: 20 16 17 18   Temp: 97.7  F (36.5 C) 98.4 F (36.9 C) 98.6 F (37 C) 98.3 F (36.8 C)  TempSrc: Oral     SpO2: 99% 96% 97% 98%  Weight:  General: Pt is alert, awake, not in acute distress Cardiovascular: RRR, S1/S2 +, no rubs, no gallops Respiratory: CTA bilaterally, no wheezing, no rhonchi Abdominal: Soft, NT, ND, bowel sounds + Extremities: no edema, no cyanosis    The results of significant diagnostics from this hospitalization (including imaging, microbiology, ancillary and laboratory) are listed below for reference.     Microbiology: Recent Results (from the past 240 hour(s))  Resp Panel by RT-PCR (Flu A&B, Covid) Nasopharyngeal Swab     Status: None   Collection Time: 10/23/20 12:10 PM   Specimen: Nasopharyngeal Swab; Nasopharyngeal(NP) swabs in vial transport medium  Result Value Ref Range Status   SARS Coronavirus 2 by RT PCR NEGATIVE NEGATIVE Final    Comment: (NOTE) SARS-CoV-2 target nucleic acids are NOT DETECTED.  The SARS-CoV-2 RNA is generally detectable in upper respiratory specimens during the acute phase of infection. The lowest concentration of SARS-CoV-2 viral copies this assay can detect is 138 copies/mL. A negative result does not preclude SARS-Cov-2 infection and should not be used as the sole basis for treatment or other patient management decisions. A negative result may occur with  improper specimen collection/handling, submission of specimen other than nasopharyngeal swab, presence of viral mutation(s) within the areas targeted by this assay, and inadequate number of viral copies(<138 copies/mL). A negative result must be combined with clinical observations, patient history, and epidemiological information. The expected result is Negative.  Fact Sheet for Patients:  EntrepreneurPulse.com.au  Fact Sheet for Healthcare Providers:  IncredibleEmployment.be  This test is no t yet approved or cleared by the [place] FDA and   has been authorized for detection and/or diagnosis of SARS-CoV-2 by FDA under an Emergency Use Authorization (EUA). This EUA will remain  in effect (meaning this test can be used) for the duration of the COVID-19 declaration under Section 564(b)(1) of the Act, 21 U.S.C.section 360bbb-3(b)(1), unless the authorization is terminated  or revoked sooner.       Influenza A by PCR NEGATIVE NEGATIVE Final   Influenza B by PCR NEGATIVE NEGATIVE Final    Comment: (NOTE) The Xpert Xpress SARS-CoV-2/FLU/RSV plus assay is intended as an aid in the diagnosis of influenza from Nasopharyngeal swab specimens and should not be used as a sole basis for treatment. Nasal washings and aspirates are unacceptable for Xpert Xpress SARS-CoV-2/FLU/RSV testing.  Fact Sheet for Patients: EntrepreneurPulse.com.au  Fact Sheet for Healthcare Providers: IncredibleEmployment.be  This test is not yet approved or cleared by the [place] FDA and has been authorized for detection and/or diagnosis of SARS-CoV-2 by FDA under an Emergency Use Authorization (EUA). This EUA will remain in effect (meaning this test can be used) for the duration of the COVID-19 declaration under Section 564(b)(1) of the Act, 21 U.S.C. section 360bbb-3(b)(1), unless the authorization is terminated or revoked.  Performed at [facility], [address] [place], [place] [address]      Labs: BNP (last 3 results) No results for input(s): BNP in the last 8760 hours. Basic Metabolic Panel: Recent Labs  Lab 10/25/20 0619 10/26/20 0501 10/28/20 1659 10/29/20 0300 10/30/20 0559  NA 136 137 135 136 136  K 3.8 4.6 4.6 4.5 4.7  CL 101 104 100 101 102  CO2 26 26 26 26 25   GLUCOSE 98 96 129* 107* 99  BUN 19 20 29* 32* 31*  CREATININE 0.99 1.20 1.16 1.26* 1.24  CALCIUM 8.9 8.9 9.1 8.7* 9.0  MG 2.0 1.9 1.9 1.9 2.1  PHOS 3.6 3.7 4.4 4.3 4.3  Liver Function Tests: Recent Labs  Lab  10/23/20 1210  AST 25  ALT 15  ALKPHOS 56  BILITOT 0.4  PROT 6.6  ALBUMIN 2.8*   No results for input(s): LIPASE, AMYLASE in the last 168 hours. No results for input(s): AMMONIA in the last 168 hours. CBC: Recent Labs  Lab 10/23/20 1210 10/25/20 0619 10/26/20 0501 10/29/20 0300  WBC 6.3 5.7 5.3 6.8  NEUTROABS 3.3  --   --   --   HGB 13.6 13.3 13.3 14.0  HCT 42.8 40.1 39.7 42.8  MCV 86.3 83.9 83.6 84.8  PLT 217 210 221 218   Cardiac Enzymes: No results for input(s): CKTOTAL, CKMB, CKMBINDEX, TROPONINI in the last 168 hours. BNP: Invalid input(s): POCBNP CBG: No results for input(s): GLUCAP in the last 168 hours. D-Dimer No results for input(s): DDIMER in the last 72 hours. Hgb A1c No results for input(s): HGBA1C in the last 72 hours. Lipid Profile No results for input(s): CHOL, HDL, LDLCALC, TRIG, CHOLHDL, LDLDIRECT in the last 72 hours. Thyroid function studies No results for input(s): TSH, T4TOTAL, T3FREE, THYROIDAB in the last 72 hours.  Invalid input(s): FREET3 Anemia work up No results for input(s): VITAMINB12, FOLATE, FERRITIN, TIBC, IRON, RETICCTPCT in the last 72 hours. Urinalysis    Component Value Date/Time   COLORURINE YELLOW (A) 03/20/2020 1209   APPEARANCEUR CLEAR (A) 03/20/2020 1209   APPEARANCEUR Clear 09/21/2014 1319   LABSPEC 1.017 03/20/2020 1209   LABSPEC 1.021 09/21/2014 1319   PHURINE 5.0 03/20/2020 1209   GLUCOSEU NEGATIVE 03/20/2020 1209   GLUCOSEU Negative 09/21/2014 1319   HGBUR SMALL (A) 03/20/2020 1209   BILIRUBINUR NEGATIVE 03/20/2020 1209   BILIRUBINUR Negative 09/21/2014 1319   KETONESUR NEGATIVE 03/20/2020 1209   PROTEINUR NEGATIVE 03/20/2020 1209   NITRITE NEGATIVE 03/20/2020 1209   LEUKOCYTESUR NEGATIVE 03/20/2020 1209   LEUKOCYTESUR Negative 09/21/2014 1319   Sepsis Labs Invalid input(s): PROCALCITONIN,  WBC,  LACTICIDVEN Microbiology Recent Results (from the past 240 hour(s))  Resp Panel by RT-PCR (Flu A&B, Covid)  Nasopharyngeal Swab     Status: None   Collection Time: 10/23/20 12:10 PM   Specimen: Nasopharyngeal Swab; Nasopharyngeal(NP) swabs in vial transport medium  Result Value Ref Range Status   SARS Coronavirus 2 by RT PCR NEGATIVE NEGATIVE Final    Comment: (NOTE) SARS-CoV-2 target nucleic acids are NOT DETECTED.  The SARS-CoV-2 RNA is generally detectable in upper respiratory specimens during the acute phase of infection. The lowest concentration of SARS-CoV-2 viral copies this assay can detect is 138 copies/mL. A negative result does not preclude SARS-Cov-2 infection and should not be used as the sole basis for treatment or other patient management decisions. A negative result may occur with  improper specimen collection/handling, submission of specimen other than nasopharyngeal swab, presence of viral mutation(s) within the areas targeted by this assay, and inadequate number of viral copies(<138 copies/mL). A negative result must be combined with clinical observations, patient history, and epidemiological information. The expected result is Negative.  Fact Sheet for Patients:  EntrepreneurPulse.com.au  Fact Sheet for Healthcare Providers:  IncredibleEmployment.be  This test is no t yet approved or cleared by the [place] FDA and  has been authorized for detection and/or diagnosis of SARS-CoV-2 by FDA under an Emergency Use Authorization (EUA). This EUA will remain  in effect (meaning this test can be used) for the duration of the COVID-19 declaration under Section 564(b)(1) of the Act, 21 U.S.C.section 360bbb-3(b)(1), unless the authorization is terminated  or revoked  sooner.       Influenza A by PCR NEGATIVE NEGATIVE Final   Influenza B by PCR NEGATIVE NEGATIVE Final    Comment: (NOTE) The Xpert Xpress SARS-CoV-2/FLU/RSV plus assay is intended as an aid in the diagnosis of influenza from Nasopharyngeal swab specimens and should not be  used as a sole basis for treatment. Nasal washings and aspirates are unacceptable for Xpert Xpress SARS-CoV-2/FLU/RSV testing.  Fact Sheet for Patients: EntrepreneurPulse.com.au  Fact Sheet for Healthcare Providers: IncredibleEmployment.be  This test is not yet approved or cleared by the [place] FDA and has been authorized for detection and/or diagnosis of SARS-CoV-2 by FDA under an Emergency Use Authorization (EUA). This EUA will remain in effect (meaning this test can be used) for the duration of the COVID-19 declaration under Section 564(b)(1) of the Act, 21 U.S.C. section 360bbb-3(b)(1), unless the authorization is terminated or revoked.  Performed at [facility], [address], [place], [place] [address]      Time coordinating discharge: Over 30 minutes  SIGNED:   Shawna Clamp, MD  [facility] 10/30/2020, 11:44 AM Pager   If 7PM-7AM, please contact night-coverage [email or web address]

## 2020-10-30 NOTE — TOC Transition Note (Addendum)
Transition of Care [facility]) - CM/SW Discharge Note   Patient Details  Name: Timothy Matthews MRN: [id number] Date of Birth: September 11, 1946  [facility] CM/SW Contact:  Kerin Salen, RN Phone Number: 10/30/2020, 12:15 PM   Clinical Narrative:   Patient to be discharged home by Clovis Fredrickson, with Kasandra Knudsen. Patient lives in the [place] states he is paid up for the month. Refused SNF and Rolling walker as recommended by PT. Patient able to walk safely on the unit hallway states he only wants a Cane for balance. [employer] notified and will provide patient with Cane. No further TOC needs.   Patient states his wife will come to discharge him, to cancel the Taxi. [employer] cancelled.   Final next level of care: Home/Self Care Barriers to Discharge: Barriers Resolved   Patient Goals and CMS Choice Patient states their goals for this hospitalization and ongoing recovery are:: To return home.   Choice offered to / list presented to : NA  Discharge Placement                  Name of family member notified: NA Patient and family notified of of transfer: 10/30/20  Discharge Plan and Services                DME Arranged: Kasandra Knudsen DME Agency: [employer] Date DME Agency Contacted: 10/30/20   Representative spoke with at DME Agency: [facility] Arranged: NA [place] Agency: NA        Social Determinants of Health ([place]) Interventions     Readmission Risk Interventions No flowsheet data found.

## 2022-03-17 ENCOUNTER — Emergency Department: Payer: No Typology Code available for payment source

## 2022-03-17 ENCOUNTER — Emergency Department
Admission: EM | Admit: 2022-03-17 | Discharge: 2022-03-17 | Disposition: A | Discharge status: Home / Self Care | Payer: No Typology Code available for payment source | Attending: Student in an Organized Health Care Education/Training Program | Admitting: Student in an Organized Health Care Education/Training Program

## 2022-03-17 ENCOUNTER — Other Ambulatory Visit: Payer: Self-pay

## 2022-03-17 DIAGNOSIS — R319 Hematuria, unspecified: Secondary | ICD-10-CM | POA: Insufficient documentation

## 2022-03-17 DIAGNOSIS — Z8546 Personal history of malignant neoplasm of prostate: Secondary | ICD-10-CM | POA: Diagnosis not present

## 2022-03-17 LAB — URINALYSIS, ROUTINE W REFLEX MICROSCOPIC
Bilirubin Urine: NEGATIVE
Glucose, UA: NEGATIVE mg/dL
Ketones, ur: NEGATIVE mg/dL
Nitrite: NEGATIVE
Protein, ur: 100 mg/dL — AB
RBC / HPF: >50 RBC/hpf — ABNORMAL HIGH (ref 0–5)
Specific Gravity, Urine: 1.021 (ref 1.005–1.030)
WBC, UA: >50 WBC/hpf — ABNORMAL HIGH (ref 0–5)
pH: 5 (ref 5.0–8.0)

## 2022-03-17 LAB — CBC
HCT: 43.4 % (ref 39.0–52.0)
Hemoglobin: 13.9 g/dL (ref 13.0–17.0)
MCH: 27.6 pg (ref 26.0–34.0)
MCHC: 32 g/dL (ref 30.0–36.0)
MCV: 86.3 fL (ref 80.0–100.0)
Platelets: 198 10*3/uL (ref 150–400)
RBC: 5.03 MIL/uL (ref 4.22–5.81)
RDW: 14.5 % (ref 11.5–15.5)
WBC: 9.7 10*3/uL (ref 4.0–10.5)
nRBC: 0 % (ref 0.0–0.2)

## 2022-03-17 LAB — BASIC METABOLIC PANEL
Anion gap: 6 (ref 5–15)
BUN: 22 mg/dL (ref 8–23)
CO2: 23 mmol/L (ref 22–32)
Calcium: 8.7 mg/dL — ABNORMAL LOW (ref 8.9–10.3)
Chloride: 107 mmol/L (ref 98–111)
Creatinine, Ser: 0.99 mg/dL (ref 0.61–1.24)
GFR, Estimated: >60 mL/min (ref >60)
Glucose, Bld: 141 mg/dL — ABNORMAL HIGH (ref 70–99)
Potassium: 3.2 mmol/L — ABNORMAL LOW (ref 3.5–5.1)
Sodium: 136 mmol/L (ref 135–145)

## 2022-03-17 MED ORDER — CEPHALEXIN 500 MG PO CAPS: 500.0000 mg | ORAL_CAPSULE | Freq: Two times a day (BID) | ORAL | 0 refills | Status: AC

## 2022-03-17 MED ORDER — CEPHALEXIN 500 MG PO CAPS
500.0000 mg | ORAL_CAPSULE | Freq: Once | ORAL | Status: AC
  Administered 2022-03-17: 500 mg via ORAL
  Filled 2022-03-17: qty 1

## 2022-03-17 NOTE — ED Triage Notes (Signed)
Pt comes with c/o blood in urine. Pt states this started this am. Pt states pain with urination and not able to fully empty bladder. Pt is not on thinners.

## 2022-03-17 NOTE — Discharge Instructions (Addendum)
Call your Urologist at the [facility] for an appointment.  Antibiotics have been sent to your pharmacy.  Return for worsening symptoms, pain, or fevers.  IMPRESSION: 1. Asymmetric bladder wall thickening on the right. Anterior right bladder extends to the fascial defect of a right groin hernia. Right bladder wall thickening may be related to the distortion from the near herniation of the bladder into the right groin fascial defect, but urothelial lesion cannot be excluded. Consider follow-up hematuria protocol CT to further evaluate. 2. No evidence for urinary stone disease. No secondary changes in either kidney or ureter. 3. Left colonic diverticulosis without diverticulitis. 4. 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 5. Aortic Atherosclerosis (ICD10-I70.0).

## 2022-03-17 NOTE — ED Provider Notes (Signed)
[facility] Provider Note    Event Date/Time   First MD Initiated Contact with Patient 03/17/22 1500     (approximate)   History   Hematuria   HPI  Timothy Matthews is a 76 y.o. male with a history of prostate cancer presents to the ER for evaluation of hematuria and some flank pain started 24 to 48 hours ago.  Is never had pain or discomfort quite like this.  Does take Flomax.  Does have some burning with urination.  Denies any nausea vomiting no measured fevers.     Physical Exam   Triage Vital Signs: ED Triage Vitals  Enc Vitals Group     BP 03/17/22 1328 (!) 161/103     Pulse Rate 03/17/22 1328 66     Resp 03/17/22 1328 18     Temp 03/17/22 1328 98 F (36.7 C)     Temp src --      SpO2 03/17/22 1328 100 %     Weight 03/17/22 1359 186 lb 8.2 oz (84.6 kg)     Height 03/17/22 1359 '5\' 10"'$  (1.778 m)     Head Circumference --      Peak Flow --      Pain Score 03/17/22 1326 8     Pain Loc --      Pain Edu? --      Excl. in [place]? --     Most recent vital signs: Vitals:   03/17/22 1328  BP: (!) 161/103  Pulse: 66  Resp: 18  Temp: 98 F (36.7 C)  SpO2: 100%     Constitutional: Alert  Eyes: Conjunctivae are normal.  Head: Atraumatic. Nose: No congestion/rhinnorhea. Mouth/Throat: Mucous membranes are moist.   Neck: Painless ROM.  Cardiovascular:   Good peripheral circulation. Respiratory: Normal respiratory effort.  No retractions.  Gastrointestinal: Soft and nontender.  Musculoskeletal:  no deformity Neurologic:  MAE spontaneously. No gross focal neurologic deficits are appreciated.  Skin:  Skin is warm, dry and intact. No rash noted. Psychiatric: Mood and affect are normal. Speech and behavior are normal.    ED Results / Procedures / Treatments   Labs (all labs ordered are listed, but only abnormal results are displayed) Labs Reviewed  URINALYSIS, ROUTINE W REFLEX MICROSCOPIC - Abnormal; Notable for the following components:       Result Value   Color, Urine YELLOW (*)    APPearance CLOUDY (*)    Hgb urine dipstick LARGE (*)    Protein, ur 100 (*)    Leukocytes,Ua LARGE (*)    RBC / HPF >50 (*)    WBC, UA >50 (*)    Bacteria, UA FEW (*)    All other components within normal limits  BASIC METABOLIC PANEL - Abnormal; Notable for the following components:   Potassium 3.2 (*)    Glucose, Bld 141 (*)    Calcium 8.7 (*)    All other components within normal limits  CBC     EKG     RADIOLOGY Please see ED Course for my review and interpretation.  I personally reviewed all radiographic images ordered to evaluate for the above acute complaints and reviewed radiology reports and findings.  These findings were personally discussed with the patient.  Please see medical record for radiology report.    PROCEDURES:  Critical Care performed: No  Procedures   MEDICATIONS ORDERED IN ED: Medications  cephALEXin (KEFLEX) capsule 500 mg (has no administration in time range)  IMPRESSION / MDM / ASSESSMENT AND PLAN / ED COURSE  I reviewed the triage vital signs and the nursing notes.                              Differential diagnosis includes, but is not limited to, ureterolithiasis, cystitis, pyelonephritis, BPH, prostatitis, mass  Patient presented to the ER for evaluation of hematuria and flank pain as described above.  Does have gross hematuria urinalysis no significant white count renal function is normal.  Will order CT imaging to evaluate for the but differential.   Clinical Course as of 03/17/22 1600  Mon Mar 17, 2022  1540 CT imaging on my interpretation does not show any evidence of kidney stone or bladder retention. [PR]  4982 CT imaging results discussed with patient.  States that he follows up with urology at the [facility] and I encouraged him to call and make appointment for urgent follow-up.  Will be placed on oral antibiotic and concern for cystitis.  He is not septic he is tolerating p.o.  ambulating with steady gait does appear appropriate for trial of outpatient management we discussed strict return precautions. [PR]    Clinical Course User Index [PR] Merlyn Lot, MD    Patient's presentation is most consistent with acute complicated illness / injury requiring diagnostic workup.   FINAL CLINICAL IMPRESSION(S) / ED DIAGNOSES   Final diagnoses:  Hematuria, unspecified type     Rx / DC Orders   ED Discharge Orders     None        Note:  This document was prepared using Dragon voice recognition software and may include unintentional dictation errors.    Merlyn Lot, MD 03/17/22 1600

## 2022-05-08 ENCOUNTER — Other Ambulatory Visit: Payer: Self-pay

## 2022-05-08 ENCOUNTER — Encounter: Payer: Self-pay | Admitting: Emergency Medicine

## 2022-05-08 ENCOUNTER — Emergency Department: Payer: No Typology Code available for payment source

## 2022-05-08 ENCOUNTER — Emergency Department
Admission: EM | Admit: 2022-05-08 | Discharge: 2022-05-08 | Disposition: A | Discharge status: Home / Self Care | Payer: No Typology Code available for payment source | Attending: Emergency Medicine | Admitting: Emergency Medicine

## 2022-05-08 DIAGNOSIS — I10 Essential (primary) hypertension: Secondary | ICD-10-CM | POA: Insufficient documentation

## 2022-05-08 DIAGNOSIS — M25522 Pain in left elbow: Secondary | ICD-10-CM | POA: Insufficient documentation

## 2022-05-08 DIAGNOSIS — Y92009 Unspecified place in unspecified non-institutional (private) residence as the place of occurrence of the external cause: Secondary | ICD-10-CM | POA: Insufficient documentation

## 2022-05-08 DIAGNOSIS — S32010A Wedge compression fracture of first lumbar vertebra, initial encounter for closed fracture: Secondary | ICD-10-CM | POA: Insufficient documentation

## 2022-05-08 DIAGNOSIS — W010XXA Fall on same level from slipping, tripping and stumbling without subsequent striking against object, initial encounter: Secondary | ICD-10-CM | POA: Insufficient documentation

## 2022-05-08 DIAGNOSIS — S3992XA Unspecified injury of lower back, initial encounter: Secondary | ICD-10-CM | POA: Diagnosis present

## 2022-05-08 MED ORDER — HYDROCODONE-ACETAMINOPHEN 5-325 MG PO TABS
1.0000 | ORAL_TABLET | Freq: Once | ORAL | Status: AC
  Administered 2022-05-08: 1 via ORAL
  Filled 2022-05-08: qty 1

## 2022-05-08 MED ORDER — HYDROCODONE-ACETAMINOPHEN 5-325 MG PO TABS: 1.0000 | ORAL_TABLET | Freq: Four times a day (QID) | ORAL | 0 refills | Status: AC | PRN

## 2022-05-08 NOTE — ED Triage Notes (Signed)
Pt reports fall 4 days ago. Pt states he lost his balance and fell flat on his back. Denies hitting head. Reports left elbow pain and swelling. Denies blood thinner use.

## 2022-05-08 NOTE — ED Provider Notes (Signed)
[facility] Provider Note    Event Date/Time   First MD Initiated Contact with Patient 05/08/22 0915     (approximate)   History   Fall   HPI  Timothy Matthews is a 76 y.o. male   presents to the ED with complaint of left elbow pain and low back pain after falling 4 days ago.  Patient states that he slipped out of his slipper causing him to lose his balance and he fell flat on his back.  He denies any head injury or loss of consciousness.  Patient has not taken any over-the-counter medication.  Has continued to ambulate without any assistance.  Patient has a history of hypertension but currently has not taken his medication in "months".  He also has history of emphysema, GERD, hepatitis C, cocaine abuse, PTSD.      Physical Exam   Triage Vital Signs: ED Triage Vitals  Enc Vitals Group     BP 05/08/22 0908 (!) 165/105     Pulse Rate 05/08/22 0908 65     Resp 05/08/22 0908 17     Temp 05/08/22 0908 98 F (36.7 C)     Temp Source 05/08/22 0908 Oral     SpO2 05/08/22 0908 94 %     Weight --      Height --      Head Circumference --      Peak Flow --      Pain Score 05/08/22 0907 9     Pain Loc --      Pain Edu? --      Excl. in [place]? --     Most recent vital signs: Vitals:   05/08/22 0908 05/08/22 1320  BP: (!) 165/105 (!) 160/98  Pulse: 65 68  Resp: 17 16  Temp: 98 F (36.7 C)   SpO2: 94% 95%     General: Awake, no distress.  CV:  Good peripheral perfusion.  Resp:  Normal effort.  Abd:  No distention.  Other:  Generalized tenderness noted on palpation of the thoracic and lumbar spine with upper lumbar more tender.  No step-offs are appreciated.  No active muscle spasms noted.  No skin discoloration or abrasions are seen.  Patient is ambulatory without any assistance.  Also there is edema noted to the left elbow posteriorly but patient has full range of motion without any restriction.  No abrasions or discoloration noted.  Radial pulses  present.  Good grip strength and patient is able move digits without any difficulty.   ED Results / Procedures / Treatments   Labs (all labs ordered are listed, but only abnormal results are displayed) Labs Reviewed - No data to display   RADIOLOGY  X-ray of left elbow interpreted by myself independent of the radiologist is negative for fracture. Lumbar spine x-ray radiology report is questionable for a possible compression fracture of L1 that may be new versus old.  Patient also has had surgery with a surgical fusion at L4-L5. CT scan lumbar spine shows findings consistent with an  acute15% acute superior endplate compression fracture at L1 in comparison with images from prior lumbar and CT abdomen images.  PROCEDURES:  Critical Care performed:   Procedures   MEDICATIONS ORDERED IN ED: Medications  HYDROcodone-acetaminophen (NORCO/VICODIN) 5-325 MG per tablet 1 tablet (1 tablet Oral Given 05/08/22 1249)     IMPRESSION / MDM / ASSESSMENT AND PLAN / ED COURSE  I reviewed the triage vital signs and the nursing notes.  Differential diagnosis includes, but is not limited to, contusion lumbar spine, fracture, compression fracture, degenerative disc disease, lumbosacral strain.  76 year old male presents to the ED after he fell 4 days ago with an injury to his back.  There was no head injury or loss of consciousness and patient has been ambulatory since that time.  Patient was made aware that his elbow was negative for fracture and that there was a possibility of a new compression fracture noted on his lumbar spine.  CT lumbar spine did show what appears to be a new compression fracture of approximately 15% of L1.  Patient was given hydrocodone while in the ED and a prescription for the same was sent to his pharmacy.  At the time of discharge patient was disgruntled because he was not getting a back brace.  He was made aware that this would come from an outside vendor and that he may be  here for 4 to 6 hours.  Patient states that he will wait until he has a proper back brace before leaving.  ----------------------------------------- 2:54 PM on 05/08/2022 ----------------------------------------- Patient has been fitted for a back brace.  He was given discharge instructions same as before to follow-up with his neurosurgeon in [place] or his PCP and that pain medication was sent to the pharmacy.    Patient's presentation is most consistent with acute complicated illness / injury requiring diagnostic workup.  FINAL CLINICAL IMPRESSION(S) / ED DIAGNOSES   Final diagnoses:  Compression fracture of L1 lumbar vertebra, closed, initial encounter ([place])  Fall in home, initial encounter     Rx / DC Orders   ED Discharge Orders          Ordered    HYDROcodone-acetaminophen (NORCO/VICODIN) 5-325 MG tablet  Every 6 hours PRN        05/08/22 1248             Note:  This document was prepared using Dragon voice recognition software and may include unintentional dictation errors.   Johnn Hai, PA-C 05/08/22 1455    Naaman Plummer, MD 05/09/22 [phone]

## 2022-05-08 NOTE — ED Notes (Signed)
See triage note  Presents with back pain s/p fall   states fell back onto his back .The patient has a history of back problems

## 2022-05-08 NOTE — Discharge Instructions (Signed)
Follow-up with your neurosurgeon about any continued back problems.  A prescription for hydrocodone was sent to the pharmacy to take as needed for pain.  You may use ice or heat to your back as needed for discomfort.  Continue with your regular medications.

## 2022-05-08 NOTE — Progress Notes (Signed)
Orthopedic Tech Progress Note Patient Details:  Timothy Matthews Aug 28, 1946 [id number] LSO Brace has been ordered from [facility] Patient ID: Timothy Matthews, male   DOB: 07-08-1946, 76 y.o.   MRN: [id number]  [name] 05/08/2022, 1:36 PM

## 2022-09-02 ENCOUNTER — Emergency Department
Admission: EM | Admit: 2022-09-02 | Discharge: 2022-09-03 | Disposition: A | Discharge status: Home / Self Care | Payer: Medicare PPO | Attending: Emergency Medicine | Admitting: Emergency Medicine

## 2022-09-02 DIAGNOSIS — F411 Generalized anxiety disorder: Secondary | ICD-10-CM | POA: Diagnosis present

## 2022-09-02 DIAGNOSIS — T50905A Adverse effect of unspecified drugs, medicaments and biological substances, initial encounter: Secondary | ICD-10-CM | POA: Diagnosis not present

## 2022-09-02 DIAGNOSIS — F419 Anxiety disorder, unspecified: Secondary | ICD-10-CM | POA: Insufficient documentation

## 2022-09-02 DIAGNOSIS — F4310 Post-traumatic stress disorder, unspecified: Secondary | ICD-10-CM | POA: Diagnosis present

## 2022-09-02 DIAGNOSIS — F418 Other specified anxiety disorders: Secondary | ICD-10-CM | POA: Diagnosis not present

## 2022-09-02 DIAGNOSIS — T398X5A Adverse effect of other nonopioid analgesics and antipyretics, not elsewhere classified, initial encounter: Secondary | ICD-10-CM | POA: Diagnosis not present

## 2022-09-02 DIAGNOSIS — T887XXA Unspecified adverse effect of drug or medicament, initial encounter: Secondary | ICD-10-CM | POA: Insufficient documentation

## 2022-09-02 DIAGNOSIS — F1410 Cocaine abuse, uncomplicated: Secondary | ICD-10-CM | POA: Diagnosis not present

## 2022-09-02 MED ORDER — LORAZEPAM 1 MG PO TABS
1.0000 mg | ORAL_TABLET | Freq: Every day | ORAL | Status: DC
Start: 2022-09-02 — End: 2022-09-03
  Administered 2022-09-02: 1 mg via ORAL
  Filled 2022-09-02: qty 1

## 2022-09-02 NOTE — Consult Note (Signed)
Telepsych Consultation   Reason for Consult:  medication reaction Referring Physician:  EDP Location of Patient: Emergency Department Location of Provider: Other: home office  Patient Identification: Timothy Matthews MRN:  [id number] Principal Diagnosis: Medication reaction Diagnosis:  Active Problems:   Anxiety state   Medication reaction   Total Time spent with patient: 30 minutes  Subjective:   Timothy Matthews is a 76 y.o. male patient admitted with medication reaction, anxiety and difficulty sleeping since starting Cymbalta 2 days ago.  HPI:  76 yo Timothy Matthews who presents to the ED for anxiety and difficulty sleeping after starting Cymbalta a couple of days ago.  He denies being on any antidepressant before this one.  [facility] his [facility]ic pain and depression.  Mild to moderate depression with no suicidal ideations.  Moderate to high anxiety for the past couple of days.  No current substance use, psychosis, paranoia, or homicidal ideations.  Instructed him to discontinue the Cymbalta and provided a one time dose of Ativan to help with his anxiety.  He needs to leave by 10 am to sign a contract with his new apartment so he has a place to live.  Psych cleared.  Past Psychiatric History: depression, anxiety, PTSD  Risk to Self:  none Risk to Others:  none Prior Inpatient Therapy:  none [facility]  Past Medical History:  Past Medical History:  Diagnosis Date   Cancer ([place])    Cocaine abuse ([place])    last ED encounter 04/07/20   Emphysema lung ([place])    pan-lobar by CT 2017   GERD (gastroesophageal reflux disease)    Hepatitis C    Hypertension    Prostate cancer ([place]) BPH   PTSD (post-traumatic stress disorder)     Past Surgical History:  Procedure Laterality Date   CERVICAL SPINE SURGERY  2017   Done at [facility]     TEE WITHOUT CARDIOVERSION N/A 10/26/2020   Procedure: TRANSESOPHAGEAL ECHOCARDIOGRAM (TEE);   Surgeon: Teodoro Spray, MD;  Location: [facility] ORS;  Service: Cardiovascular;  Laterality: N/A;   TONSILLECTOMY     TRANSURETHRAL RESECTION OF PROSTATE     Family History:  Family History  Problem Relation Age of Onset   Breast cancer Mother    Diabetes Mother    Glaucoma Mother    Alcohol abuse Father    Pancreatic cancer Father    Family Psychiatric  History: none Social History:  Social History   Substance and Sexual Activity  Alcohol Use Not Currently     Social History   Substance and Sexual Activity  Drug Use Yes   Types: Cocaine, Marijuana    Social History   Socioeconomic History   Marital status: Married    Spouse name: Not on file   Number of children: Not on file   Years of education: Not on file   Highest education level: Not on file  Occupational History   Not on file  Tobacco Use   Smoking status: Every Day    Types: Cigarettes   Smokeless tobacco: Never  Substance and Sexual Activity   Alcohol use: Not Currently   Drug use: Yes    Types: Cocaine, Marijuana   Sexual activity: Not on file  Other Topics Concern   Not on file  Social History Narrative   Not on file   Social Determinants of Health   Financial Resource Strain: Not on file  Food Insecurity:  Not on file  Transportation Needs: Not on file  Physical Activity: Not on file  Stress: Not on file  Social Connections: Not on file   Additional Social History:  going to sign a contract for an apartment    Allergies:   Allergies  Allergen Reactions   Trospium     Other Reaction(s): Blurring of visual image    Labs: No results found for this or any previous visit (from the past 48 hour(s)).  Medications:  Current Facility-Administered Medications  Medication Dose Route Frequency Provider Last Rate Last Admin   LORazepam (ATIVAN) tablet 1 mg  1 mg Oral QHS Patrecia Pour, NP   1 mg at 09/02/22 2116   Current Outpatient Medications  Medication Sig Dispense Refill   amLODipine  (NORVASC) 10 MG tablet Take 10 mg by mouth daily.     aspirin EC 81 MG EC tablet Take 1 tablet (81 mg total) by mouth daily. Swallow whole. 30 tablet 11   atorvastatin (LIPITOR) 40 MG tablet Take 1 tablet (40 mg total) by mouth daily. 30 tablet 1   cholecalciferol (VITAMIN D3) 25 MCG (1000 UNIT) tablet Take 2,000 Units by mouth daily.     DULoxetine (CYMBALTA) 30 MG capsule Take 30 mg by mouth daily.     omeprazole (PRILOSEC) 20 MG capsule Take 20 mg by mouth daily.     tamsulosin (FLOMAX) 0.4 MG CAPS capsule Take 0.8 mg by mouth.     terazosin (HYTRIN) 10 MG capsule Take 10 mg by mouth at bedtime.     albuterol (VENTOLIN HFA) 108 (90 Base) MCG/ACT inhaler Inhale 2 puffs into the lungs every 6 (six) hours as needed for wheezing or shortness of breath. 8 g 1   cetirizine (ZYRTEC) 10 MG tablet Take 10 mg by mouth daily.     gabapentin (NEURONTIN) 300 MG capsule Take 300 mg by mouth 3 (three) times daily. (Patient not taking: Reported on 09/02/2022)     HYDROcodone-acetaminophen (NORCO/VICODIN) 5-325 MG tablet Take 1 tablet by mouth every 6 (six) hours as needed for moderate pain. (Patient not taking: Reported on 09/02/2022) 15 tablet 0   sildenafil (VIAGRA) 100 MG tablet Take 100 mg by mouth daily as needed for erectile dysfunction.      Musculoskeletal: Strength & Muscle Tone:  denies issue Gait & Station: denies issues Patient leans: denies issues  Psychiatric Specialty Exam: Physical Exam Vitals and nursing note reviewed.  Psychiatric:        Attention and Perception: Attention and perception normal.        Mood and Affect: Mood is anxious.        Speech: Speech normal.        Behavior: Behavior normal. Behavior is cooperative.        Thought Content: Thought content normal.        Cognition and Memory: Cognition and memory normal.        Judgment: Judgment normal.     Review of Systems  Psychiatric/Behavioral:  The patient is nervous/anxious.   All other systems reviewed and are  negative.   Blood pressure (!) 143/94, pulse 66, temperature 98.1 F (36.7 C), temperature source Oral, resp. rate 16, height '5\' 10"'$  (1.778 m), weight 85.7 kg, SpO2 98 %.Body mass index is 27.12 kg/m.  General Appearance: UTA, telepsych  Eye Contact:  UTA  Speech:  Clear and Coherent  Volume:  Normal  Mood:  Anxious  Affect:  UTA  Thought Process:  Coherent  Orientation:  Full (Time, Place, and Person)  Thought Content:  Logical  Suicidal Thoughts:  No  Homicidal Thoughts:  No  Memory:  Immediate;   Good Recent;   Good Remote;   Good  Judgement:  Good  Insight:  Good  Psychomotor Activity:  UTA  Concentration:  Concentration: Good and Attention Span: Good  Recall:  Good  Fund of Knowledge:  Good  Language:  Good  Akathisia:  No  Handed:  Right  AIMS (if indicated):     Assets:  Leisure Time Resilience Social Support  ADL's:  Intact  Cognition:  WNL  Sleep:        Physical Exam: Physical Exam Vitals and nursing note reviewed.  Psychiatric:        Attention and Perception: Attention and perception normal.        Mood and Affect: Mood is anxious.        Speech: Speech normal.        Behavior: Behavior normal. Behavior is cooperative.        Thought Content: Thought content normal.        Cognition and Memory: Cognition and memory normal.        Judgment: Judgment normal.    Review of Systems  Psychiatric/Behavioral:  The patient is nervous/anxious.   All other systems reviewed and are negative.  Blood pressure (!) 143/94, pulse 66, temperature 98.1 F (36.7 C), temperature source Oral, resp. rate 16, height '5\' 10"'$  (1.778 m), weight 85.7 kg, SpO2 98 %. Body mass index is 27.12 kg/m.  Treatment Plan Summary: Medication reaction: Discontinue Cymbalta Ativan 1 mg once [facility]  Disposition: No evidence of imminent risk to self or others at present.   Patient does not meet criteria for psychiatric inpatient admission. Supportive therapy provided  about ongoing stressors.  This service was provided via telemedicine using a 2-way, interactive audio and video technology.  Names of all persons participating in this telemedicine service and their role in this encounter. Name: Waylan Boga Role: [place]  Name: Jefm Bryant Role: Client    Waylan Boga, NP 09/02/2022 9:44 PM

## 2022-09-02 NOTE — ED Triage Notes (Signed)
Pt sts that he was prescribed cymbalta for PTSD however his pharmacy did not have it, so it will be arriving in the mail. Pt is tearful in triage with a flat effect.

## 2022-09-02 NOTE — ED Provider Notes (Signed)
[facility] Provider Note    Event Date/Time   First MD Initiated Contact with Patient 09/02/22 1926     (approximate)   History   Post-Traumatic Stress Disorder   HPI  Timothy Matthews is a 76 y.o. male  who presents to the emergency department today because of concern for PTSD. The patient states that he has been feeling "weird" all day. He says he was seen at the [facility] for this issue a few days ago and was diagnosed cymbalta which he does not feel is helping. He denies any SI.        Physical Exam   Triage Vital Signs: ED Triage Vitals  Enc Vitals Group     BP 09/02/22 1831 (!) 143/94     Pulse Rate 09/02/22 1831 66     Resp 09/02/22 1831 16     Temp 09/02/22 1831 98.1 F (36.7 C)     Temp Source 09/02/22 1831 Oral     SpO2 09/02/22 1831 98 %     Weight 09/02/22 1832 190 lb (86.2 kg)     Height 09/02/22 1832 '5\' 10"'$  (1.778 m)     Head Circumference --      Peak Flow --      Pain Score 09/02/22 1834 8     Pain Loc --      Pain Edu? --      Excl. in [place]? --     Most recent vital signs: Vitals:   09/02/22 1831  BP: (!) 143/94  Pulse: 66  Resp: 16  Temp: 98.1 F (36.7 C)  SpO2: 98%   General: Awake, alert, oriented. CV:  Good peripheral perfusion. Regular rate and rhythm. Resp:  Normal effort. Lungs clear. Abd:  No distention.     ED Results / Procedures / Treatments   Labs (all labs ordered are listed, but only abnormal results are displayed) Labs Reviewed - No data to display   EKG  None   RADIOLOGY None   PROCEDURES:  Critical Care performed: No  Procedures   MEDICATIONS ORDERED IN ED: Medications - No data to display   IMPRESSION / MDM / [place] / ED COURSE  I reviewed the triage vital signs and the nursing notes.                              Differential diagnosis includes, but is not limited to, PTSD  Patient's presentation is most consistent with acute presentation with potential  threat to life or bodily function.  Patient presents to the emergency department today with complaints of PTSD. States he was seen recently at the [place] for the same. Denies any SI on my exam. Will have psychiatry evaluate. Do not feel patient requires IVC at this time.   The patient has been placed in psychiatric observation due to the need to provide a safe environment for the patient while obtaining psychiatric consultation and evaluation, as well as ongoing medical and medication management to treat the patient's condition.  The patient has not been placed under full IVC at this time.  Patient has been seen by psychiatry. Feel patient is safe for discharge.    FINAL CLINICAL IMPRESSION(S) / ED DIAGNOSES   Final diagnoses:  PTSD (post-traumatic stress disorder)      Note:  This document was prepared using Dragon voice recognition software and may include unintentional dictation errors.    Nance Pear,  MD 09/02/22 2211

## 2022-09-02 NOTE — BH Assessment (Signed)
Comprehensive Clinical Assessment (CCA) Note  09/02/2022 Timothy Matthews [id number]  Chief Complaint: Patient is a 76 year old male presenting to [facility] ED voluntarily. Per triage note Pt sts that he was prescribed cymbalta for PTSD however his pharmacy did not have it, so it will be arriving in the mail. Pt is tearful in triage with a flat effect. During assessment patient appears alert and oriented x4, calm and cooperative. Patient reports having a "weird feeling" he reports since taking his Cymbalta "2-3 days ago I've been feeling nervous, shaky, I can't sleep and I've been feeling worthless." Patient [facility] due [facility] symptoms where he was started on the Cymbalta and reports that the medication makes him feel worse. Patient reports [facility] outpatient psychiatrist with [facility] but reports that he hasn't seen the provider in a while. Patient is currently in between living situations but has plans on meeting with a rental property tomorrow and doesn't desire another inpatient stay at this time. Patient denies current SI/HI/AH/VH and has the support of his wife.   Per Psyc NP Waylan Boga patient to remain in observation overnight and to discharge in the morning Chief Complaint  Patient presents with   Post-Traumatic Stress Disorder   Visit Diagnosis: PTSD    CCA Screening, Triage and Referral (STR)  Patient Reported Information How did you hear about [place]? Self  Referral name: No data recorded Referral phone number: No data recorded  Whom do you see for routine medical problems? No data recorded Practice/Facility Name: No data recorded Practice/Facility Phone Number: No data recorded Name of Contact: No data recorded Contact Number: No data recorded Contact Fax Number: No data recorded Prescriber Name: No data recorded Prescriber Address (if known): No data recorded  What Is the Reason for Your Visit/Call Today? Pt sts that he was prescribed  cymbalta for PTSD however his pharmacy did not have it, so it will be arriving in the mail. Pt is tearful in triage with a flat effect.  How Long Has This Been Causing You Problems? > than 6 months  What Do You Feel Would Help You the Most Today? Treatment for Depression or other mood problem   Have You Recently Been in Any Inpatient Treatment (Hospital/Detox/Crisis Center/28-Day Program)? No data recorded Name/Location of Program/Hospital:No data recorded How Long Were You There? No data recorded When Were You Discharged? No data recorded  Have You Ever Received Services From [facility] Before? No data recorded Who Do You See at [facility]? No data recorded  Have You Recently Had Any Thoughts About Hurting Yourself? No  Are You Planning to Commit Suicide/Harm Yourself At This time? No   Have you Recently Had Thoughts About [place]? No  Explanation: No data recorded  Have You Used Any Alcohol or Drugs in the Past 24 Hours? No  How Long Ago Did You Use Drugs or Alcohol? No data recorded What Did You Use and How Much? No data recorded  Do You Currently Have a Therapist/Psychiatrist? Yes  Name of Therapist/Psychiatrist: Patient has a psychiatrist at the [facility] Recently Discharged From Any Office Practice or Programs? No  Explanation of Discharge From Practice/Program: No data recorded    CCA Screening Triage Referral Assessment Type of Contact: Face-to-Face  Is this Initial or Reassessment? No data recorded Date Telepsych consult ordered in [facility]:  No data recorded Time Telepsych consult ordered in [facility]:  No data recorded  Patient  Reported Information Reviewed? No data recorded Patient Left Without Being Seen? No data recorded Reason for Not Completing Assessment: No data recorded  Collateral Involvement: No data recorded  Does Patient Have a Wall? No data recorded Name and Contact of Legal Guardian: No data  recorded If Minor and Not Living with Parent(s), Who has Custody? No data recorded Is CPS involved or ever been involved? Never  Is APS involved or ever been involved? Never   Patient Determined To Be At Risk for Harm To Self or Others Based on Review of Patient Reported Information or Presenting Complaint? No  Method: No data recorded Availability of Means: No data recorded Intent: No data recorded Notification Required: No data recorded Additional Information for Danger to Others Potential: No data recorded Additional Comments for Danger to Others Potential: No data recorded Are There Guns or Other Weapons in Your Home? No  Types of Guns/Weapons: No data recorded Are These Weapons Safely Secured?                            No data recorded Who Could Verify You Are Able To Have These Secured: No data recorded Do You Have any Outstanding Charges, Pending Court Dates, Parole/Probation? No data recorded Contacted To Inform of Risk of Harm To Self or Others: No data recorded  Location of Assessment: [facility] ED   Does Patient Present under Involuntary Commitment? No  IVC Papers Initial File Date: No data recorded  [place] of Residence: [place]   Patient Currently Receiving the Following Services: No data recorded  Determination of Need: Emergent (2 hours)   Options For Referral: No data recorded    CCA Biopsychosocial Intake/Chief Complaint:  No data recorded Current Symptoms/Problems: No data recorded  Patient Reported Schizophrenia/Schizoaffective Diagnosis in Past: No   Strengths: Patient is able to communicate his needs  Preferences: No data recorded Abilities: No data recorded  Type of Services Patient Feels are Needed: No data recorded  Initial Clinical Notes/Concerns: No data recorded  Mental Health Symptoms Depression:   Difficulty Concentrating; Worthlessness   Duration of Depressive symptoms:  Greater than two weeks   Mania:   None   Anxiety:     Difficulty concentrating; Fatigue; Restlessness   Psychosis:   None   Duration of Psychotic symptoms: No data recorded  Trauma:   Avoids reminders of event; Difficulty staying/falling asleep; Re-experience of traumatic event   Obsessions:   None   Compulsions:   None   Inattention:   None   Hyperactivity/Impulsivity:   None   Oppositional/Defiant Behaviors:   None   Emotional Irregularity:   None   Other Mood/Personality Symptoms:  No data recorded   Mental Status Exam Appearance and self-care  Stature:   Average   Weight:   Average weight   Clothing:   Casual   Grooming:   Normal   Cosmetic use:   None   Posture/gait:   Normal   Motor activity:   Not Remarkable   Sensorium  Attention:   Normal   Concentration:   Normal   Orientation:   X5   Recall/memory:   Normal   Affect and Mood  Affect:   Appropriate   Mood:   Depressed   Relating  Eye contact:   Normal   Facial expression:   Responsive   Attitude toward examiner:   Cooperative   Thought and Language  Speech flow:  Clear and Coherent  Thought content:   Appropriate to Mood and Circumstances   Preoccupation:   None   Hallucinations:   None   Organization:  No data recorded  [employer] of Knowledge:   Fair   Intelligence:   Average   Abstraction:   Normal   Judgement:   Good   Reality Testing:   Realistic   Insight:   Good   Decision Making:   Normal   Social Functioning  Social Maturity:   Responsible   Social Judgement:   Normal   Stress  Stressors:   Housing; Teacher, music Ability:   Normal   Skill Deficits:   None   Supports:   Family     Religion: Religion/Spirituality Are You A Religious Person?: No  Leisure/Recreation: Leisure / Recreation Do You Have Hobbies?: No  Exercise/Diet: Exercise/Diet Do You Exercise?: No Have You Gained or Lost A Significant Amount of Weight in the Past Six  Months?: No Do You Follow a Special Diet?: No Do You Have Any Trouble Sleeping?: No   CCA Employment/Education Employment/Work Situation: Employment / Work Nurse, children's Situation: Retired Social research officer, government has Been Impacted by Current Illness: No 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 (Describe in comment) 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 Type of Psychiatric Treatment/Services in [employer]: Patient has received treatment for his PTSD in the past  Education: Education Is Patient Currently Attending School?: No Did You Have An Individualized Education Program (IIEP): No Did You Have Any Difficulty At [employer]?: No Patient's Education Has Been Impacted by Current Illness: No   CCA Family/Childhood History Family and Relationship History: Family history Marital status: Separated Separated, when?: Patient reports being separated from his wife for 13 years What types of issues is patient dealing with in the relationship?: Unknown Additional relationship information: None reported Does patient have children?: No  Childhood History:  Childhood History By whom was/is the patient raised?: Both parents Did patient suffer any verbal/emotional/physical/sexual abuse as a child?: No Did patient suffer from severe childhood neglect?: No Has patient ever been sexually abused/assaulted/raped as an adolescent or adult?: No Was the patient ever a victim of a crime or a disaster?: No Witnessed domestic violence?: No Has patient been affected by domestic violence as an adult?: No  Child/Adolescent Assessment:     CCA Substance Use Alcohol/Drug Use: Alcohol / Drug Use Pain Medications: See MAR Prescriptions: See MAR Over the Counter: See MAR History of alcohol / drug use?: No history of alcohol / drug abuse                         ASAM's:  Six Dimensions of Multidimensional Assessment  Dimension 1:  Acute Intoxication  and/or Withdrawal Potential:      Dimension 2:  Biomedical Conditions and Complications:      Dimension 3:  Emotional, Behavioral, or Cognitive Conditions and Complications:     Dimension 4:  Readiness to Change:     Dimension 5:  Relapse, Continued use, or Continued Problem Potential:     Dimension 6:  Recovery/Living Environment:     ASAM Severity Score:    ASAM Recommended Level of Treatment:     Substance use Disorder (SUD)    Recommendations for Services/Supports/Treatments:    DSM5 Diagnoses: Patient Active Problem List   Diagnosis Date Noted   Anxiety state 09/02/2022   Medication reaction 09/02/2022   CVA (cerebral vascular accident) ([place]) 10/23/2020  Cocaine abuse ([place]) 10/23/2020   HTN (hypertension) 10/23/2020   Emphysema lung ([place]) 10/23/2020   GERD (gastroesophageal reflux disease) 10/23/2020    Patient Centered Plan: Patient is on the following Treatment Plan(s):  Post Traumatic Stress Disorder   Referrals to Alternative Service(s): Referred to Alternative Service(s):   Place:   Date:   Time:    Referred to Alternative Service(s):   Place:   Date:   Time:    Referred to Alternative Service(s):   Place:   Date:   Time:    Referred to Alternative Service(s):   Place:   Date:   Time:      '@BHCOLLABOFCARE'$ @  [employer], LCAS-A

## 2022-09-02 NOTE — Discharge Instructions (Addendum)
Please seek medical attention and help for any thoughts about wanting to harm yourself, harm others, any concerning change in behavior, severe depression, inappropriate drug use or any other new or concerning symptoms. ° °

## 2022-09-03 ENCOUNTER — Other Ambulatory Visit: Payer: Self-pay

## 2022-09-03 ENCOUNTER — Emergency Department
Admission: EM | Admit: 2022-09-03 | Discharge: 2022-09-05 | Disposition: A | Discharge status: Home / Self Care | Payer: No Typology Code available for payment source | Attending: Emergency Medicine | Admitting: Emergency Medicine

## 2022-09-03 DIAGNOSIS — F4310 Post-traumatic stress disorder, unspecified: Secondary | ICD-10-CM | POA: Insufficient documentation

## 2022-09-03 DIAGNOSIS — Z8546 Personal history of malignant neoplasm of prostate: Secondary | ICD-10-CM | POA: Insufficient documentation

## 2022-09-03 DIAGNOSIS — F1414 Cocaine abuse with cocaine-induced mood disorder: Secondary | ICD-10-CM | POA: Insufficient documentation

## 2022-09-03 DIAGNOSIS — F1410 Cocaine abuse, uncomplicated: Secondary | ICD-10-CM | POA: Diagnosis present

## 2022-09-03 DIAGNOSIS — I10 Essential (primary) hypertension: Secondary | ICD-10-CM | POA: Insufficient documentation

## 2022-09-03 DIAGNOSIS — Z046 Encounter for general psychiatric examination, requested by authority: Secondary | ICD-10-CM | POA: Diagnosis present

## 2022-09-03 DIAGNOSIS — F1910 Other psychoactive substance abuse, uncomplicated: Secondary | ICD-10-CM

## 2022-09-03 DIAGNOSIS — F17210 Nicotine dependence, cigarettes, uncomplicated: Secondary | ICD-10-CM | POA: Diagnosis not present

## 2022-09-03 LAB — URINE DRUG SCREEN, QUALITATIVE (ARMC ONLY)
Amphetamines, Ur Screen: NOT DETECTED
Barbiturates, Ur Screen: NOT DETECTED
Benzodiazepine, Ur Scrn: NOT DETECTED
Cannabinoid 50 Ng, Ur ~~LOC~~: NOT DETECTED
Cocaine Metabolite,Ur ~~LOC~~: POSITIVE — AB
MDMA (Ecstasy)Ur Screen: NOT DETECTED
Methadone Scn, Ur: NOT DETECTED
Opiate, Ur Screen: NOT DETECTED
Phencyclidine (PCP) Ur S: NOT DETECTED
Tricyclic, Ur Screen: NOT DETECTED

## 2022-09-03 MED ORDER — TAMSULOSIN HCL 0.4 MG PO CAPS: 0.8000 mg | ORAL_CAPSULE | Freq: Every day | ORAL | Status: DC

## 2022-09-03 MED ORDER — ASPIRIN 81 MG PO TBEC
81.0000 mg | DELAYED_RELEASE_TABLET | Freq: Every day | ORAL | Status: DC
  Administered 2022-09-03 – 2022-09-05 (×3): 81 mg via ORAL
  Filled 2022-09-03 (×3): qty 1

## 2022-09-03 MED ORDER — IBUPROFEN 400 MG PO TABS
400.0000 mg | ORAL_TABLET | Freq: Once | ORAL | Status: AC
  Administered 2022-09-03: 400 mg via ORAL
  Filled 2022-09-03: qty 1

## 2022-09-03 MED ORDER — TAMSULOSIN HCL 0.4 MG PO CAPS
0.8000 mg | ORAL_CAPSULE | Freq: Every day | ORAL | Status: DC
  Administered 2022-09-03 – 2022-09-04 (×2): 0.8 mg via ORAL
  Filled 2022-09-03 (×2): qty 2

## 2022-09-03 MED ORDER — MELATONIN 5 MG PO TABS
5.0000 mg | ORAL_TABLET | Freq: Every day | ORAL | Status: DC
  Administered 2022-09-03 – 2022-09-04 (×2): 5 mg via ORAL
  Filled 2022-09-03 (×2): qty 1

## 2022-09-03 MED ORDER — PANTOPRAZOLE SODIUM 40 MG PO TBEC
40.0000 mg | DELAYED_RELEASE_TABLET | Freq: Every day | ORAL | Status: DC
  Administered 2022-09-03 – 2022-09-05 (×3): 40 mg via ORAL
  Filled 2022-09-03 (×3): qty 1

## 2022-09-03 MED ORDER — ATORVASTATIN CALCIUM 20 MG PO TABS
40.0000 mg | ORAL_TABLET | Freq: Every day | ORAL | Status: DC
  Administered 2022-09-03 – 2022-09-05 (×3): 40 mg via ORAL
  Filled 2022-09-03 (×3): qty 2

## 2022-09-03 MED ORDER — AMLODIPINE BESYLATE 5 MG PO TABS
10.0000 mg | ORAL_TABLET | Freq: Every day | ORAL | Status: DC
  Administered 2022-09-03 – 2022-09-05 (×3): 10 mg via ORAL
  Filled 2022-09-03 (×3): qty 2

## 2022-09-03 MED ORDER — DOXYLAMINE SUCCINATE (SLEEP) 25 MG PO TABS: 25.0000 mg | ORAL_TABLET | Freq: Every evening | ORAL | Status: DC | PRN

## 2022-09-03 NOTE — BH Assessment (Addendum)
Referral information sent to;   [facility] [phone] ext [phone]) Facility reports that there are no staff available tonight to review referral and to contact after 8am today 09/04/22

## 2022-09-03 NOTE — BH Assessment (Signed)
TTS and Psych consults have been completed. Patient was seen less than 24hrs ago at 10:05pm on 09/02/22. Per York Cerise, NP, patient does not meet criteria for inpatient psychiatric admission.

## 2022-09-03 NOTE — ED Notes (Signed)
Lunch tray and ginger ale given.

## 2022-09-03 NOTE — ED Notes (Signed)
This pt has home medications in secure bag located in pharmacy.

## 2022-09-03 NOTE — ED Notes (Signed)
Pt requested sleep medication, home nighttime med tamsulosin, and ibuprofen for gum pain.  Pt states that he had a recent tooth extraction and his gums have been bothering him. EDP Jacelyn Grip notified.

## 2022-09-03 NOTE — Consult Note (Signed)
[facility] Face-to-Face Psychiatry Consult   Reason for Consult:  homelessness/ substance abuse Referring Physician:  Quale Patient Identification: Timothy Matthews MRN:  [id number] Principal Diagnosis: Cocaine abuse ([place]) Diagnosis:  Principal Problem:   Cocaine abuse ([place])   Total Time spent with patient: 30 minutes  Subjective:   Timothy Matthews is a 76 y.o. male patient admitted with substance use/depression. Marland Kitchen  HPI: Patient was in the ED last evening and was evaluated by Waylan Boga over the phone and Harold Hedge, TTS counselor in person; assessed as not meeting criteria for admission and he was released this morning.   Patient says he was discharged this morning and reports he "never left the property." He says he has a lot to do but worries about it getting all done. He said he had an appointment to sign for a lease on a new apartment, but he "never made it." On evaluation, patient is somewhat irritable. He is vague about circumstances,  just saying he has PTSD and worries a lot. He declines for [place] to reach out to his wife, stating that they "are not together, but she tries to help him out."  He endorses passive suicidal thoughts without a plan, but these certainly appear to be more related to not being able to get back to [facility] and being homeless. He does not have a plan or intent for suicide. Patient says he wants to get back to the [place] where he can enter the substance use rehab program again. He says that is why he was last admitted, not for PTSD. Patient states he feels safe in the hospital because he is homeless, feels worthless, but he says he  "doesn't think I need to stay." He denies active suicidal thoughts, denies previous attempts. Denies homicidal thoughts, paranoia, auditory or visual hallucinations. He does not appear to be responding to internal stimuli.   Patient denies illicit drug use since before he was last in the [place] hospital. His UDS is positive for cocaine.   Writer  asked TTS if they are able to refer patient to the [place]. If he does not get a bed there, he can likely be discharged tomorrow, after re-assessment for suicidal thoughts.   Past Psychiatric History: PTSD  Risk to Self:   Risk to Others:   Prior Inpatient Therapy:   Prior Outpatient Therapy:    Past Medical History:  Past Medical History:  Diagnosis Date   Cancer ([place])    Cocaine abuse ([place])    last ED encounter 04/07/20   Emphysema lung ([place])    pan-lobar by CT 2017   GERD (gastroesophageal reflux disease)    Hepatitis C    Hypertension    Prostate cancer ([place]) BPH   PTSD (post-traumatic stress disorder)     Past Surgical History:  Procedure Laterality Date   CERVICAL SPINE SURGERY  2017   Done at [place]     TEE WITHOUT CARDIOVERSION N/A 10/26/2020   Procedure: TRANSESOPHAGEAL ECHOCARDIOGRAM (TEE);  Surgeon: Teodoro Spray, MD;  Location: [facility] ORS;  Service: Cardiovascular;  Laterality: N/A;   TONSILLECTOMY     TRANSURETHRAL RESECTION OF PROSTATE     Family History:  Family History  Problem Relation Age of Onset   Breast cancer Mother    Diabetes Mother    Glaucoma Mother    Alcohol abuse Father    Pancreatic cancer Father    Family Psychiatric  History:  Social History:  Social History  Substance and Sexual Activity  Alcohol Use Not Currently     Social History   Substance and Sexual Activity  Drug Use Yes   Types: Cocaine, Marijuana    Social History   Socioeconomic History   Marital status: Married    Spouse name: Not on file   Number of children: Not on file   Years of education: Not on file   Highest education level: Not on file  Occupational History   Not on file  Tobacco Use   Smoking status: Every Day    Types: Cigarettes   Smokeless tobacco: Never  Vaping Use   Vaping Use: Never used  Substance and Sexual Activity   Alcohol use: Not Currently   Drug use: Yes    Types: Cocaine, Marijuana   Sexual activity: Not on  file  Other Topics Concern   Not on file  Social History Narrative   Not on file   Social Determinants of Health   Financial Resource Strain: Not on file  Food Insecurity: Not on file  Transportation Needs: Not on file  Physical Activity: Not on file  Stress: Not on file  Social Connections: Not on file   Additional Social History:    Allergies:   Allergies  Allergen Reactions   Trospium     Other Reaction(s): Blurring of visual image    Labs:  Results for orders placed or performed during the hospital encounter of 09/03/22 (from the past 48 hour(s))  Urine Drug Screen, Qualitative ([place] only)     Status: Abnormal   Collection Time: 09/03/22  3:39 PM  Result Value Ref Range   Tricyclic, Ur Screen NONE DETECTED NONE DETECTED   Amphetamines, Ur Screen NONE DETECTED NONE DETECTED   MDMA (Ecstasy)Ur Screen NONE DETECTED NONE DETECTED   Cocaine Metabolite,Ur [place] POSITIVE (A) NONE DETECTED   Opiate, Ur Screen NONE DETECTED NONE DETECTED   Phencyclidine (PCP) Ur S NONE DETECTED NONE DETECTED   Cannabinoid 50 Ng, Ur [place] NONE DETECTED NONE DETECTED   Barbiturates, Ur Screen NONE DETECTED NONE DETECTED   Benzodiazepine, Ur Scrn NONE DETECTED NONE DETECTED   Methadone Scn, Ur NONE DETECTED NONE DETECTED    Comment: (NOTE) Tricyclics + metabolites, urine    Cutoff 1000 ng/mL Amphetamines + metabolites, urine  Cutoff 1000 ng/mL MDMA (Ecstasy), urine              Cutoff 500 ng/mL Cocaine Metabolite, urine          Cutoff 300 ng/mL Opiate + metabolites, urine        Cutoff 300 ng/mL Phencyclidine (PCP), urine         Cutoff 25 ng/mL Cannabinoid, urine                 Cutoff 50 ng/mL Barbiturates + metabolites, urine  Cutoff 200 ng/mL Benzodiazepine, urine              Cutoff 200 ng/mL Methadone, urine                   Cutoff 300 ng/mL  The urine drug screen provides only a preliminary, unconfirmed analytical test result and should not be used for non-medical purposes. Clinical  consideration and professional judgment should be applied to any positive drug screen result due to possible interfering substances. A more specific alternate chemical method must be used in order to obtain a confirmed analytical result. Gas chromatography / mass spectrometry (GC/MS) is the preferred confirm atory method. Performed at [employer]  [facility], [address] [place], [place] [address]     Current Facility-Administered Medications  Medication Dose Route Frequency Provider Last Rate Last Admin   amLODipine (NORVASC) tablet 10 mg  10 mg Oral Daily Lucillie Garfinkel, MD   10 mg at 09/03/22 1810   aspirin EC tablet 81 mg  81 mg Oral Daily Lucillie Garfinkel, MD   81 mg at 09/03/22 1810   atorvastatin (LIPITOR) tablet 40 mg  40 mg Oral Daily Lucillie Garfinkel, MD   40 mg at 09/03/22 1810   pantoprazole (PROTONIX) EC tablet 40 mg  40 mg Oral Daily Lucillie Garfinkel, MD   40 mg at 09/03/22 1810   Current Outpatient Medications  Medication Sig Dispense Refill   amLODipine (NORVASC) 10 MG tablet Take 10 mg by mouth daily.     aspirin EC 81 MG EC tablet Take 1 tablet (81 mg total) by mouth daily. Swallow whole. 30 tablet 11   atorvastatin (LIPITOR) 40 MG tablet Take 1 tablet (40 mg total) by mouth daily. 30 tablet 1   cholecalciferol (VITAMIN D3) 25 MCG (1000 UNIT) tablet Take 2,000 Units by mouth daily.     omeprazole (PRILOSEC) 20 MG capsule Take 20 mg by mouth daily.     tamsulosin (FLOMAX) 0.4 MG CAPS capsule Take 0.8 mg by mouth.     albuterol (VENTOLIN HFA) 108 (90 Base) MCG/ACT inhaler Inhale 2 puffs into the lungs every 6 (six) hours as needed for wheezing or shortness of breath. (Patient not taking: Reported on 09/03/2022) 8 g 1   cetirizine (ZYRTEC) 10 MG tablet Take 10 mg by mouth daily.     DULoxetine (CYMBALTA) 30 MG capsule Take 30 mg by mouth daily.     gabapentin (NEURONTIN) 300 MG capsule Take 300 mg by mouth 3 (three) times daily. (Patient not taking: Reported on 09/02/2022)      HYDROcodone-acetaminophen (NORCO/VICODIN) 5-325 MG tablet Take 1 tablet by mouth every 6 (six) hours as needed for moderate pain. (Patient not taking: Reported on 09/02/2022) 15 tablet 0   sildenafil (VIAGRA) 100 MG tablet Take 100 mg by mouth daily as needed for erectile dysfunction.     terazosin (HYTRIN) 10 MG capsule Take 10 mg by mouth at bedtime. (Patient not taking: Reported on 09/03/2022)      Musculoskeletal: Strength & Muscle Tone: within normal limits Gait & Station: normal Patient leans: N/A    Psychiatric Specialty Exam:  Presentation  General Appearance: Disheveled  Eye Contact:Fair  Speech:Garbled  Speech Volume:Normal  Handedness:No data recorded  Mood and Affect  Mood:Irritable  Affect:Blunt   Thought Process  Thought Processes:Coherent  Descriptions of Associations:Intact  Orientation:Full (Time, Place and Person)  Thought Content:WDL  History of Schizophrenia/Schizoaffective disorder:No  Duration of Psychotic Symptoms:No data recorded Hallucinations:Hallucinations: None  Ideas of Reference:None  Suicidal Thoughts:Suicidal Thoughts: Yes, Passive SI Passive Intent and/or Plan: Without Intent; Without Plan  Homicidal Thoughts:Homicidal Thoughts: No   Sensorium  Memory:Immediate Fair  Judgment:Poor  Insight:Poor   Executive Functions  Concentration:Fair  Attention Span:Fair  [place]   Psychomotor Activity  Psychomotor Activity:Psychomotor Activity: Normal   Assets  Assets:Financial Resources/Insurance; Desire for Improvement; Resilience; Physical Health   Sleep  Sleep:Sleep: Fair   Physical Exam: Physical Exam Vitals and nursing note reviewed.  HENT:     Head: Normocephalic.     Nose: No congestion or rhinorrhea.  Eyes:     General:        Right eye: No  discharge.        Left eye: No discharge.  Cardiovascular:     Rate and Rhythm: Normal rate.  Pulmonary:      Effort: Pulmonary effort is normal.  Musculoskeletal:        General: Normal range of motion.     Cervical back: Normal range of motion.  Skin:    General: Skin is dry.  Neurological:     Mental Status: He is alert and oriented to person, place, and time.  Psychiatric:        Attention and Perception: Attention normal.        Mood and Affect: Affect is blunt.        Speech: Speech normal.        Behavior: Behavior is cooperative.        Thought Content: Thought content normal.        Cognition and Memory: Cognition normal.        Judgment: Judgment is impulsive.    Review of Systems  Constitutional: Negative.   Respiratory: Negative.    Cardiovascular: Negative.   Musculoskeletal: Negative.   Neurological: Negative.   Psychiatric/Behavioral:  Positive for depression (chronic/stable) and substance abuse. Negative for hallucinations, memory loss and suicidal ideas. The patient is not nervous/anxious and does not have insomnia.        PTSD    Blood pressure (!) 157/83, pulse (!) 54, temperature 98.2 F (36.8 C), temperature source Oral, resp. rate 18, height '5\' 10"'$  (1.778 m), weight 85.7 kg, SpO2 99 %. Body mass index is 27.12 kg/m.  Treatment Plan Summary: Plan Refer to TTS to check for bed availability for substance use at the [facility]. If they do not have a bed, patient can be re-evaluated tomorrow and can most likely discharge. Patient is voluntary and does not meet criteria for involuntary commitment at this time. Reviewed with Dr. Jacelyn Grip  Disposition: No evidence of imminent risk to self or others at present.   Supportive therapy provided about ongoing stressors. Discussed crisis plan, support from social network, calling 911, coming to the Emergency Department, and calling Suicide Hotline. If patient is not offered a bed at the [facility] for substance use he can be re-evaluated for suicidal thoughts  tomorrow. At time of this writing he does not ,meet criteria for IVC or  inpatient psychiatric hospitalization.    Sherlon Handing, NP 09/03/2022 6:33 PM

## 2022-09-03 NOTE — ED Notes (Signed)
Pt given graham crackers/peanut butter/gingerale per his request.

## 2022-09-03 NOTE — ED Triage Notes (Signed)
Reports his PSTD is causing racing thoughts and thoughts of SI but no plan.  Reports he has a fear of leaving.  Patient denies A/V hallucinations.

## 2022-09-03 NOTE — ED Provider Notes (Signed)
[facility] Provider Note    Event Date/Time   First MD Initiated Contact with Patient 09/03/22 1242     (approximate)   History   Psychiatric Evaluation   HPI  Timothy Matthews is a 76 y.o. male with a history of documented cocaine abuse prior stroke and emphysema.  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  At that time he reports that he had relapsed with drugs in his system, but now he does not have any drugs in his system and he still having concerns about disruptive PTSD.  He voices thoughts of suicide but tells me he has no plan.  Currently reports he is happy to have a meal and feels like he needs to be seen and evaluated by mental health professional.  No recent illness no fevers no chills no pains or discomforts.  Currently reports he is enjoying his sandwich and would also like for [place] to find something he could watch on television     Physical Exam   Triage Vital Signs: ED Triage Vitals  Enc Vitals Group     BP 09/03/22 1141 (!) 157/83     Pulse Rate 09/03/22 1141 (!) 54     Resp 09/03/22 1141 18     Temp 09/03/22 1141 98.2 F (36.8 C)     Temp Source 09/03/22 1141 Oral     SpO2 09/03/22 1141 99 %     Weight 09/03/22 1138 189 lb (85.7 kg)     Height 09/03/22 1138 '5\' 10"'$  (1.778 m)     Head Circumference --      [place] --      Pain Score 09/03/22 1138 8     Pain Loc --      Pain Edu? --      Excl. in [place]? --     Most recent vital signs: Vitals:   09/03/22 1141  BP: (!) 157/83  Pulse: (!) 54  Resp: 18  Temp: 98.2 F (36.8 C)  SpO2: 99%     General: Awake, no distress.  Very pleasant.  Has eaten about half a meal tray currently eating a sandwich without difficulty. CV:  Good peripheral perfusion.  Resp:  Normal effort.  Abd:  No distention.  Other:  Very pleasant well-oriented.   Reports suicidal thoughts but no plan.  He is very motivated and would like to see a psychiatrist today.  He was not able to see a psychiatrist yesterday when he came in   ED Results / Procedures / Treatments   Labs (all labs ordered are listed, but only abnormal results are displayed) Labs Reviewed  URINE DRUG SCREEN, QUALITATIVE ([place])     EKG     RADIOLOGY     PROCEDURES:  Critical Care performed: No  Procedures   MEDICATIONS ORDERED IN ED: Medications - No data to display   IMPRESSION / MDM / [place] / ED COURSE  I reviewed the triage vital signs and the nursing notes.                              Differential diagnosis includes, but is not limited to, PTSD, depression, polysubstance abuse, need for mental health evaluation etc.  Patient denies any concerns for acute medical illness.  Was here yesterday as well, presenting voluntarily and seems motivated to see psychiatry.  He reports some passive suicidal thoughts but no active plan ingestion overdose or attempt on his own life.  At this juncture I think it is reasonable to have him remain voluntary as he seems motivated to see a psychiatrist and was not able to see them yesterday  I confirmed with Delrae Sawyers hold, nurse practitioner our psychiatrist or nurse practitioner will be able to see him today and he is agreeable with this plan.  Patient's presentation is most consistent with acute complicated illness / injury requiring diagnostic workup.  ----------------------------------------- 3:27 PM on 09/03/2022 ----------------------------------------- Psychiatry team is seen the patient, and Delrae Sawyers hold request we obtain a drug screen to help with their planning and disposition at this time.  Ongoing care assigned to Dr. Lucillie Garfinkel    FINAL CLINICAL IMPRESSION(S) / ED DIAGNOSES   Final diagnoses:  PTSD (post-traumatic stress disorder)     Rx / DC Orders   ED Discharge Orders      None        Note:  This document was prepared using Dragon voice recognition software and may include unintentional dictation errors.   Delman Kitten, MD 09/03/22 1527    The patient has been placed in psychiatric observation due to the need to provide a safe environment for the patient while obtaining psychiatric consultation and evaluation, as well as ongoing medical and medication management to treat the patient's condition.  The patient has not been placed under full IVC at this time.     Delman Kitten, MD 09/04/22 1650

## 2022-09-04 DIAGNOSIS — F1414 Cocaine abuse with cocaine-induced mood disorder: Secondary | ICD-10-CM | POA: Diagnosis not present

## 2022-09-04 MED ORDER — PAROXETINE HCL 10 MG PO TABS
10.0000 mg | ORAL_TABLET | Freq: Every day | ORAL | Status: DC
  Administered 2022-09-04 – 2022-09-05 (×2): 10 mg via ORAL
  Filled 2022-09-04 (×2): qty 1

## 2022-09-04 MED ORDER — BENZOCAINE 10 % MT GEL
Freq: Two times a day (BID) | OROMUCOSAL | Status: DC | PRN
  Filled 2022-09-04: qty 9

## 2022-09-04 NOTE — ED Notes (Signed)
Pt belonging bag 1 of 1 placed in locked storage unit in [place] in bin appropriately labeled with Rm 23 by this RN.

## 2022-09-04 NOTE — ED Notes (Signed)
Pt notified will give med once received from pharm. Pt requested door be closed so he can go to sleep; blinds open.

## 2022-09-04 NOTE — ED Provider Notes (Signed)
Emergency Medicine Observation Re-evaluation Note  Timothy Matthews is a 76 y.o. male, seen on rounds today.  Pt initially presented to the ED for complaints of Psychiatric Evaluation Currently, the patient is resting, voices no medical complaints.  Physical Exam  BP (!) 146/85 (BP Location: Left Arm)   Pulse 60   Temp 98.4 F (36.9 C) (Oral)   Resp 18   Ht '5\' 10"'$  (1.778 m)   Wt 85.7 kg   SpO2 93%   BMI 27.12 kg/m  Physical Exam General: Resting in no acute distress Cardiac: No cyanosis Lungs: Equal rise and fall Psych: Not agitated  ED Course / MDM  EKG:   I have reviewed the labs performed to date as well as medications administered while in observation.  Recent changes in the last 24 hours include no events overnight.  Plan  Current plan is for psychiatric disposition.    Paulette Blanch, MD 09/04/22 [phone]

## 2022-09-04 NOTE — ED Notes (Signed)
Report received by previous RN. Pt currently sleeping.

## 2022-09-04 NOTE — Consult Note (Addendum)
[facility] Face-to-Face Psychiatry Consult   Reason for Consult:  cocaine abuse with suicidal ideations Referring Physician:  EDP Patient Identification: Timothy Matthews MRN:  [id number] Principal Diagnosis: Cocaine abuse ([place]) Diagnosis:  Principal Problem:   Cocaine abuse ([place])   Total Time spent with patient: 15 minutes  Subjective:   Timothy Matthews is a 76 y.o. male patient admitted with cocaine abuse with suicidal ideations.  HPI:  76 yo male who presented to the ED after using cocaine with suicidal ideations.  "I had some frightfulness."  He reports he was not able to get to the apartment place on Wednesday to sign papers for his apartment.  He was able to use cocaine and returned to the ED with suicidal ideations, no plan.  No prior attempts.  "I want to get to the [facility] to get into a PTSD program."  He is not interested in rehab.  Anxiety is minimal, lying on his bed watching television with no distress noted.  He reports PTSD, no symptoms present.  It appears he is experiencing secondary gain of housing issues with his ED visits, will continue to monitor and attempt to get him to the [place].  Past Psychiatric History: depression, anxiety, cocaine use d/o, PTSD  Risk to Self:none Risk to Others:  none [facility]  Past Medical History:  Past Medical History:  Diagnosis Date   Cancer ([place])    Cocaine abuse ([place])    last ED encounter 04/07/20   Emphysema lung ([place])    pan-lobar by CT 2017   GERD (gastroesophageal reflux disease)    Hepatitis C    Hypertension    Prostate cancer ([place]) BPH   PTSD (post-traumatic stress disorder)     Past Surgical History:  Procedure Laterality Date   CERVICAL SPINE SURGERY  2017   Done at [place]     TEE WITHOUT CARDIOVERSION N/A 10/26/2020   Procedure: TRANSESOPHAGEAL ECHOCARDIOGRAM (TEE);  Surgeon: Teodoro Spray, MD;  Location: [facility] ORS;  Service: Cardiovascular;  Laterality:  N/A;   TONSILLECTOMY     TRANSURETHRAL RESECTION OF PROSTATE     Family History:  Family History  Problem Relation Age of Onset   Breast cancer Mother    Diabetes Mother    Glaucoma Mother    Alcohol abuse Father    Pancreatic cancer Father    Family Psychiatric  History: see above Social History:  Social History   Substance and Sexual Activity  Alcohol Use Not Currently     Social History   Substance and Sexual Activity  Drug Use Yes   Types: Cocaine, Marijuana    Social History   Socioeconomic History   Marital status: Married    Spouse name: Not on file   Number of children: Not on file   Years of education: Not on file   Highest education level: Not on file  Occupational History   Not on file  Tobacco Use   Smoking status: Every Day    Types: Cigarettes   Smokeless tobacco: Never  Vaping Use   Vaping Use: Never used  Substance and Sexual Activity   Alcohol use: Not Currently   Drug use: Yes    Types: Cocaine, Marijuana   Sexual activity: Not on file  Other Topics Concern   Not on file  Social History Narrative   Not on file   Social Determinants of Health   Financial Resource Strain: Not  on file  Food Insecurity: Not on file  Transportation Needs: Not on file  Physical Activity: Not on file  Stress: Not on file  Social Connections: Not on file   Additional Social History:    Allergies:   Allergies  Allergen Reactions   Trospium     Other Reaction(s): Blurring of visual image    Labs:  Results for orders placed or performed during the hospital encounter of 09/03/22 (from the past 48 hour(s))  Urine Drug Screen, Qualitative ([place] only)     Status: Abnormal   Collection Time: 09/03/22  3:39 PM  Result Value Ref Range   Tricyclic, Ur Screen NONE DETECTED NONE DETECTED   Amphetamines, Ur Screen NONE DETECTED NONE DETECTED   MDMA (Ecstasy)Ur Screen NONE DETECTED NONE DETECTED   Cocaine Metabolite,Ur [place] POSITIVE (A) NONE DETECTED   Opiate,  Ur Screen NONE DETECTED NONE DETECTED   Phencyclidine (PCP) Ur S NONE DETECTED NONE DETECTED   Cannabinoid 50 Ng, Ur [place] NONE DETECTED NONE DETECTED   Barbiturates, Ur Screen NONE DETECTED NONE DETECTED   Benzodiazepine, Ur Scrn NONE DETECTED NONE DETECTED   Methadone Scn, Ur NONE DETECTED NONE DETECTED    Comment: (NOTE) Tricyclics + metabolites, urine    Cutoff 1000 ng/mL Amphetamines + metabolites, urine  Cutoff 1000 ng/mL MDMA (Ecstasy), urine              Cutoff 500 ng/mL Cocaine Metabolite, urine          Cutoff 300 ng/mL Opiate + metabolites, urine        Cutoff 300 ng/mL Phencyclidine (PCP), urine         Cutoff 25 ng/mL Cannabinoid, urine                 Cutoff 50 ng/mL Barbiturates + metabolites, urine  Cutoff 200 ng/mL Benzodiazepine, urine              Cutoff 200 ng/mL Methadone, urine                   Cutoff 300 ng/mL  The urine drug screen provides only a preliminary, unconfirmed analytical test result and should not be used for non-medical purposes. Clinical consideration and professional judgment should be applied to any positive drug screen result due to possible interfering substances. A more specific alternate chemical method must be used in order to obtain a confirmed analytical result. Gas chromatography / mass spectrometry (GC/MS) is the preferred confirm atory method. Performed at [facility], [address] [place], [place] [address]     Current Facility-Administered Medications  Medication Dose Route Frequency Provider Last Rate Last Admin   amLODipine (NORVASC) tablet 10 mg  10 mg Oral Daily Lucillie Garfinkel, MD   10 mg at 09/04/22 3536   aspirin EC tablet 81 mg  81 mg Oral Daily Lucillie Garfinkel, MD   81 mg at 09/04/22 0917   atorvastatin (LIPITOR) tablet 40 mg  40 mg Oral Daily Lucillie Garfinkel, MD   40 mg at 09/04/22 1443   doxylamine (Sleep) (UNISOM) tablet 25 mg  25 mg Oral QHS PRN Lucillie Garfinkel, MD       melatonin tablet 5 mg  5 mg Oral QHS Lucillie Garfinkel,  MD   5 mg at 09/03/22 2313   pantoprazole (PROTONIX) EC tablet 40 mg  40 mg Oral Daily Lucillie Garfinkel, MD   40 mg at 09/04/22 0917   tamsulosin (FLOMAX) capsule 0.8 mg  0.8 mg Oral QPC supper Jacelyn Grip,  Purcell Nails, MD   0.8 mg at 09/03/22 2313   Current Outpatient Medications  Medication Sig Dispense Refill   amLODipine (NORVASC) 10 MG tablet Take 10 mg by mouth daily.     aspirin EC 81 MG EC tablet Take 1 tablet (81 mg total) by mouth daily. Swallow whole. 30 tablet 11   atorvastatin (LIPITOR) 40 MG tablet Take 1 tablet (40 mg total) by mouth daily. 30 tablet 1   cholecalciferol (VITAMIN D3) 25 MCG (1000 UNIT) tablet Take 2,000 Units by mouth daily.     omeprazole (PRILOSEC) 20 MG capsule Take 20 mg by mouth daily.     tamsulosin (FLOMAX) 0.4 MG CAPS capsule Take 0.8 mg by mouth.     albuterol (VENTOLIN HFA) 108 (90 Base) MCG/ACT inhaler Inhale 2 puffs into the lungs every 6 (six) hours as needed for wheezing or shortness of breath. (Patient not taking: Reported on 09/03/2022) 8 g 1   cetirizine (ZYRTEC) 10 MG tablet Take 10 mg by mouth daily.     DULoxetine (CYMBALTA) 30 MG capsule Take 30 mg by mouth daily.     gabapentin (NEURONTIN) 300 MG capsule Take 300 mg by mouth 3 (three) times daily. (Patient not taking: Reported on 09/02/2022)     HYDROcodone-acetaminophen (NORCO/VICODIN) 5-325 MG tablet Take 1 tablet by mouth every 6 (six) hours as needed for moderate pain. (Patient not taking: Reported on 09/02/2022) 15 tablet 0   sildenafil (VIAGRA) 100 MG tablet Take 100 mg by mouth daily as needed for erectile dysfunction.     terazosin (HYTRIN) 10 MG capsule Take 10 mg by mouth at bedtime. (Patient not taking: Reported on 09/03/2022)      Musculoskeletal: Strength & Muscle Tone: within normal limits Gait & Station: normal Patient leans: N/A  Psychiatric Specialty Exam: Physical Exam Vitals and nursing note reviewed.  Constitutional:      Appearance: Normal appearance.  HENT:     Head:  Normocephalic.     Nose: Nose normal.  Pulmonary:     Effort: Pulmonary effort is normal.  Musculoskeletal:        General: Normal range of motion.     Cervical back: Normal range of motion.  Neurological:     General: No focal deficit present.     Mental Status: He is alert and oriented to person, place, and time.  Psychiatric:        Attention and Perception: Attention and perception normal.        Mood and Affect: Mood is anxious and depressed.        Speech: Speech normal.        Behavior: Behavior normal. Behavior is cooperative.        Thought Content: Thought content includes suicidal ideation.        Cognition and Memory: Cognition and memory normal.        Judgment: Judgment normal.     Review of Systems  Psychiatric/Behavioral:  Positive for depression, substance abuse and suicidal ideas. The patient is nervous/anxious.   All other systems reviewed and are negative.   Blood pressure (!) 143/70, pulse (!) 58, temperature 97.9 F (36.6 C), temperature source Oral, resp. rate 16, height '5\' 10"'$  (1.778 m), weight 85.7 kg, SpO2 95 %.Body mass index is 27.12 kg/m.  General Appearance: Casual  Eye Contact:  Good  Speech:  Normal Rate  Volume:  Normal  Mood:  Anxious and Depressed  Affect:  Non-Congruent  Thought Process:  Coherent  Orientation:  Full (Time,  Place, and Person)  Thought Content:  WDL and Logical  Suicidal Thoughts:  Yes.  without intent/plan  Homicidal Thoughts:  No  Memory:  Immediate;   Good Recent;   Good Remote;   Good  Judgement:  Fair  Insight:  Fair  Psychomotor Activity:  Normal  Concentration:  Concentration: Good and Attention Span: Good  Recall:  Good  Fund of Knowledge:  Good  Language:  Good  Akathisia:  No  Handed:  Right  AIMS (if indicated):     Assets:  Leisure Time Physical Health Resilience  ADL's:  Intact  Cognition:  WNL  Sleep:         Physical Exam: Physical Exam Vitals and nursing note reviewed.  Constitutional:       Appearance: Normal appearance.  HENT:     Head: Normocephalic.     Nose: Nose normal.  Pulmonary:     Effort: Pulmonary effort is normal.  Musculoskeletal:        General: Normal range of motion.     Cervical back: Normal range of motion.  Neurological:     General: No focal deficit present.     Mental Status: He is alert and oriented to person, place, and time.  Psychiatric:        Attention and Perception: Attention and perception normal.        Mood and Affect: Mood is anxious and depressed.        Speech: Speech normal.        Behavior: Behavior normal. Behavior is cooperative.        Thought Content: Thought content includes suicidal ideation.        Cognition and Memory: Cognition and memory normal.        Judgment: Judgment normal.    Review of Systems  Psychiatric/Behavioral:  Positive for depression, substance abuse and suicidal ideas. The patient is nervous/anxious.   All other systems reviewed and are negative.  Blood pressure (!) 143/70, pulse (!) 58, temperature 97.9 F (36.6 C), temperature source Oral, resp. rate 16, height '5\' 10"'$  (1.778 m), weight 85.7 kg, SpO2 95 %. Body mass index is 27.12 kg/m.  Treatment Plan Summary: Daily contact with patient to assess and evaluate symptoms and progress in treatment, Medication management, and Plan : Cocaine abuse with cocaine induced mood disorder: [facility] Started Paxil 10 mg daily  Disposition: Recommend psychiatric Inpatient admission when medically cleared. Supportive therapy provided about ongoing stressors.  Waylan Boga, NP 09/04/2022 10:17 AM

## 2022-09-04 NOTE — BH Assessment (Addendum)
Spoke with [facility] [phone] ext. [phone]) and received additional form for possible bed. Patient and ER MD (Dr. Cherylann Banas) signed the form and faxed back. Updated the patient's nurse Metta Clines) and ER Sect ([facility]). On the fax cover sheet, put the ER Sect number for return phone calls, because of no TTS tonight.  [facility] and let [facility] know the information was [facility]ed to them and the ER sect. Number is on the cover sheet for return phone calls.

## 2022-09-04 NOTE — ED Notes (Signed)
Pt given pm snack and drink

## 2022-09-04 NOTE — ED Notes (Signed)
[facility] paperwork faxed by Kerry Dory for possible bed assignment

## 2022-09-05 NOTE — ED Notes (Signed)
Pt resting in bed with lights off. Pt in no signs of distress

## 2022-09-05 NOTE — ED Provider Notes (Signed)
Emergency Medicine Observation Re-evaluation Note  Timothy Matthews is a 76 y.o. male, seen on rounds today.  Pt initially presented to the ED for complaints of Psychiatric Evaluation  Currently, the patient is is no acute distress. Denies any concerns at this time.  Physical Exam  Blood pressure 136/83, pulse 63, temperature 98.4 F (36.9 C), temperature source Oral, resp. rate 16, height '5\' 10"'$  (1.778 m), weight 85.7 kg, SpO2 93 %.  Physical Exam: General: No apparent distress Pulm: Normal WOB Neuro: Moving all extremities Psych: Resting comfortably     ED Course / MDM     I have reviewed the labs performed to date as well as medications administered while in observation.  Recent changes in the last 24 hours include: No acute events overnight.  Plan   Current plan: Patient awaiting psychiatric disposition. Patient is not under full IVC at this time.    Timothy Matthews, Timothy [place], DO 09/05/22 0700

## 2022-09-05 NOTE — BH Assessment (Signed)
[facility] called [facility] but was unable to reach anyone at this time. Will try again.

## 2022-09-05 NOTE — ED Notes (Signed)
Pt received medications from pharmacy and confirmed to this RN the bag contained all his medications that he brought to the ED.

## 2022-09-05 NOTE — ED Notes (Signed)
Pt given breakfast tray and beverage.  

## 2022-09-05 NOTE — ED Provider Notes (Signed)
-----------------------------------------   10:01 AM on 09/05/2022 -----------------------------------------   Blood pressure 136/83, pulse 63, temperature 98.4 F (36.9 C), temperature source Oral, resp. rate 16, height '5\' 10"'$  (1.778 m), weight 85.7 kg, SpO2 93 %.  The patient is calm and cooperative at this time.  There have been no acute events since the last update.  Awaiting disposition plan from case management/social work.  Patient stable for discharge home after dialysis today.  Patient's wife working on getting an apartment.  Stable for discharge home.    Nathaniel Man, MD 09/05/22 1002

## 2022-09-05 NOTE — ED Notes (Signed)
Pt walked to bathroom with steady gait 

## 2022-09-05 NOTE — Consult Note (Signed)
[facility] Face-to-Face Psychiatry Consult   Reason for Consult:  cocaine abuse with suicidal ideations Referring Physician:  EDP Patient Identification: Timothy Matthews MRN:  [id number] Principal Diagnosis: Cocaine abuse with cocaine-induced mood disorder ([place]) Diagnosis:  Principal Problem:   Cocaine abuse with cocaine-induced mood disorder ([place]) Active Problems:   Cocaine abuse ([place])   Total Time spent with patient: 30 minutes  Subjective:   Timothy Matthews is a 76 y.o. male patient admitted with cocaine abuse with suicidal ideations.  Today, the client denies suicidal ideations and wants to discharge so his wife can take him to sign the lease for his apartment after she finishes dialysis.  He has been calm and cooperative in the ED with more focus on food than anything.  Sleep and appetite are "good", depression is low and anxiety is moderate regarding housing.  [facility]  His main concern is housing and psych stable to discharge to obtain housing.  No homicidal ideations, hallucinations, or withdrawal symptoms.  Caveat: Hospitalization should not be used as a substitute for social services, substance abuse treatment, and legal assistance for patients who make contingent suicide threats. (Characteristics and six-month outcome of patients who use suicide threats to seek hospital admission. (1996). Psychiatric Services, 47(8), [phone]. Doi:10.1176/ps.47.8.871)  More recently, in a 2021 study in the Journal of Emergency Medicine examining outcomes of patients being discharged from Psychiatric emergency services, no patients diagnosed as malingering died of suicide within 365 days of discharge. [employer], Loh R, Goans CRR, Ryall K, [place], Dalton A. Suicide and Self-Harm Outcomes Among Psychiatric Emergency Service Patients Diagnosed As Malingering. Ginger Blue Oct;61(4):381-386. doi: 10.1016/j.jemermed.2021.05.016. Epub 2021 Jun 29. PMID:  03009233.  Patient's treatment goals of housing is unobtainable in the short-term inpatient setting. Any safety benefit of hospitalization is mitigated by patient's lack of collaboration and engagement with the treatment team. Continued treatment in the hospital is delaying patient's engagement with outpatient services and the period during which patient must demonstrate outpatient stability to be eligible for housing. Continued hospitalization is no longer benefiting the patient and may be unnecessarily delaying effective intervention.  HPI on admission:  76 yo male who presented to the ED after using cocaine with suicidal ideations.  "I had some frightfulness."  He reports he was not able to get to the apartment place on Wednesday to sign papers for his apartment.  He was able to use cocaine and returned to the ED with suicidal ideations, no plan.  No prior attempts.  "I want to get to the [place] to get into a PTSD program."  He is not interested in rehab.  Anxiety is minimal, lying on his bed watching television with no distress noted.  He reports PTSD, no symptoms present.  It appears he is experiencing secondary gain of housing issues with his ED visits, will continue to monitor and attempt to get him to the [place].  Past Psychiatric History: depression, anxiety, cocaine use d/o, PTSD  Risk to Self:none Risk to Others:  none [facility]  Past Medical History:  Past Medical History:  Diagnosis Date   Cancer ([place])    Cocaine abuse ([place])    last ED encounter 04/07/20   Emphysema lung ([place])    pan-lobar by CT 2017   GERD (gastroesophageal reflux disease)    Hepatitis C    Hypertension    Prostate cancer ([place]) BPH   PTSD (post-traumatic  stress disorder)     Past Surgical History:  Procedure Laterality Date   CERVICAL SPINE SURGERY  2017   Done at [place]     TEE WITHOUT CARDIOVERSION N/A 10/26/2020   Procedure: TRANSESOPHAGEAL  ECHOCARDIOGRAM (TEE);  Surgeon: Teodoro Spray, MD;  Location: [facility] ORS;  Service: Cardiovascular;  Laterality: N/A;   TONSILLECTOMY     TRANSURETHRAL RESECTION OF PROSTATE     Family History:  Family History  Problem Relation Age of Onset   Breast cancer Mother    Diabetes Mother    Glaucoma Mother    Alcohol abuse Father    Pancreatic cancer Father    Family Psychiatric  History: see above Social History:  Social History   Substance and Sexual Activity  Alcohol Use Not Currently     Social History   Substance and Sexual Activity  Drug Use Yes   Types: Cocaine, Marijuana    Social History   Socioeconomic History   Marital status: Married    Spouse name: Not on file   Number of children: Not on file   Years of education: Not on file   Highest education level: Not on file  Occupational History   Not on file  Tobacco Use   Smoking status: Every Day    Types: Cigarettes   Smokeless tobacco: Never  Vaping Use   Vaping Use: Never used  Substance and Sexual Activity   Alcohol use: Not Currently   Drug use: Yes    Types: Cocaine, Marijuana   Sexual activity: Not on file  Other Topics Concern   Not on file  Social History Narrative   Not on file   Social Determinants of Health   Financial Resource Strain: Not on file  Food Insecurity: Not on file  Transportation Needs: Not on file  Physical Activity: Not on file  Stress: Not on file  Social Connections: Not on file   Additional Social History: going to sign an apartment lease today    Allergies:   Allergies  Allergen Reactions   Trospium     Other Reaction(s): Blurring of visual image    Labs:  Results for orders placed or performed during the hospital encounter of 09/03/22 (from the past 48 hour(s))  Urine Drug Screen, Qualitative ([place] only)     Status: Abnormal   Collection Time: 09/03/22  3:39 PM  Result Value Ref Range   Tricyclic, Ur Screen NONE DETECTED NONE DETECTED   Amphetamines, Ur  Screen NONE DETECTED NONE DETECTED   MDMA (Ecstasy)Ur Screen NONE DETECTED NONE DETECTED   Cocaine Metabolite,Ur [place] POSITIVE (A) NONE DETECTED   Opiate, Ur Screen NONE DETECTED NONE DETECTED   Phencyclidine (PCP) Ur S NONE DETECTED NONE DETECTED   Cannabinoid 50 Ng, Ur [place] NONE DETECTED NONE DETECTED   Barbiturates, Ur Screen NONE DETECTED NONE DETECTED   Benzodiazepine, Ur Scrn NONE DETECTED NONE DETECTED   Methadone Scn, Ur NONE DETECTED NONE DETECTED    Comment: (NOTE) Tricyclics + metabolites, urine    Cutoff 1000 ng/mL Amphetamines + metabolites, urine  Cutoff 1000 ng/mL MDMA (Ecstasy), urine              Cutoff 500 ng/mL Cocaine Metabolite, urine          Cutoff 300 ng/mL Opiate + metabolites, urine        Cutoff 300 ng/mL Phencyclidine (PCP), urine         Cutoff 25 ng/mL Cannabinoid, urine  Cutoff 50 ng/mL Barbiturates + metabolites, urine  Cutoff 200 ng/mL Benzodiazepine, urine              Cutoff 200 ng/mL Methadone, urine                   Cutoff 300 ng/mL  The urine drug screen provides only a preliminary, unconfirmed analytical test result and should not be used for non-medical purposes. Clinical consideration and professional judgment should be applied to any positive drug screen result due to possible interfering substances. A more specific alternate chemical method must be used in order to obtain a confirmed analytical result. Gas chromatography / mass spectrometry (GC/MS) is the preferred confirm atory method. Performed at [facility], [address], [place], [place] [address]     Current Facility-Administered Medications  Medication Dose Route Frequency Provider Last Rate Last Admin   amLODipine (NORVASC) tablet 10 mg  10 mg Oral Daily Lucillie Garfinkel, MD   10 mg at 09/05/22 1007   aspirin EC tablet 81 mg  81 mg Oral Daily Lucillie Garfinkel, MD   81 mg at 09/05/22 1007   atorvastatin (LIPITOR) tablet 40 mg  40 mg Oral Daily Lucillie Garfinkel, MD   40  mg at 09/05/22 1007   benzocaine (ORAJEL) 10 % mucosal gel   Mouth/Throat BID PRN Arta Silence, MD       doxylamine (Sleep) (UNISOM) tablet 25 mg  25 mg Oral QHS PRN Lucillie Garfinkel, MD       melatonin tablet 5 mg  5 mg Oral QHS Lucillie Garfinkel, MD   5 mg at 09/04/22 2130   pantoprazole (PROTONIX) EC tablet 40 mg  40 mg Oral Daily Lucillie Garfinkel, MD   40 mg at 09/05/22 1006   PARoxetine (PAXIL) tablet 10 mg  10 mg Oral Daily Patrecia Pour, NP   10 mg at 09/05/22 1010   tamsulosin (FLOMAX) capsule 0.8 mg  0.8 mg Oral QPC supper Lucillie Garfinkel, MD   0.8 mg at 09/04/22 1726   Current Outpatient Medications  Medication Sig Dispense Refill   amLODipine (NORVASC) 10 MG tablet Take 10 mg by mouth daily.     aspirin EC 81 MG EC tablet Take 1 tablet (81 mg total) by mouth daily. Swallow whole. 30 tablet 11   atorvastatin (LIPITOR) 40 MG tablet Take 1 tablet (40 mg total) by mouth daily. 30 tablet 1   cholecalciferol (VITAMIN D3) 25 MCG (1000 UNIT) tablet Take 2,000 Units by mouth daily.     omeprazole (PRILOSEC) 20 MG capsule Take 20 mg by mouth daily.     tamsulosin (FLOMAX) 0.4 MG CAPS capsule Take 0.8 mg by mouth.     albuterol (VENTOLIN HFA) 108 (90 Base) MCG/ACT inhaler Inhale 2 puffs into the lungs every 6 (six) hours as needed for wheezing or shortness of breath. (Patient not taking: Reported on 09/03/2022) 8 g 1   cetirizine (ZYRTEC) 10 MG tablet Take 10 mg by mouth daily.     DULoxetine (CYMBALTA) 30 MG capsule Take 30 mg by mouth daily.     gabapentin (NEURONTIN) 300 MG capsule Take 300 mg by mouth 3 (three) times daily. (Patient not taking: Reported on 09/02/2022)     HYDROcodone-acetaminophen (NORCO/VICODIN) 5-325 MG tablet Take 1 tablet by mouth every 6 (six) hours as needed for moderate pain. (Patient not taking: Reported on 09/02/2022) 15 tablet 0   sildenafil (VIAGRA) 100 MG tablet Take 100 mg by mouth daily as needed for erectile  dysfunction.     terazosin (HYTRIN) 10 MG capsule Take 10 mg  by mouth at bedtime. (Patient not taking: Reported on 09/03/2022)      Musculoskeletal: Strength & Muscle Tone: within normal limits Gait & Station: normal Patient leans: N/A  Psychiatric Specialty Exam: Physical Exam Vitals and nursing note reviewed.  Constitutional:      Appearance: Normal appearance.  HENT:     Head: Normocephalic.     Nose: Nose normal.  Pulmonary:     Effort: Pulmonary effort is normal.  Musculoskeletal:        General: Normal range of motion.     Cervical back: Normal range of motion.  Neurological:     General: No focal deficit present.     Mental Status: He is alert and oriented to person, place, and time.  Psychiatric:        Attention and Perception: Attention and perception normal.        Mood and Affect: Mood is anxious and depressed.        Speech: Speech normal.        Behavior: Behavior normal. Behavior is cooperative.        Thought Content: Thought content normal.        Cognition and Memory: Cognition and memory normal.        Judgment: Judgment normal.     Review of Systems  Psychiatric/Behavioral:  Positive for depression and substance abuse. The patient is nervous/anxious.   All other systems reviewed and are negative.   Blood pressure 136/83, pulse 63, temperature 98.4 F (36.9 C), temperature source Oral, resp. rate 16, height _0  (1.778 m), weight 85.7 kg, SpO2 93 %.Body mass index is 27.12 kg/m.  General Appearance: Casual  Eye Contact:  Good  Speech:  Normal Rate  Volume:  Normal  Mood:  Anxious and Depressed  Affect:  Non-Congruent  Thought Process:  Coherent  Orientation:  Full (Time, Place, and Person)  Thought Content:  WDL and Logical  Suicidal Thoughts:  No  Homicidal Thoughts:  No  Memory:  Immediate;   Good Recent;   Good Remote;   Good  Judgement:  Fair  Insight:  Fair  Psychomotor Activity:  Normal  Concentration:  Concentration: Good and Attention Span: Good  Recall:  Good  Fund of Knowledge:  Good   Language:  Good  Akathisia:  No  Handed:  Right  AIMS (if indicated):     Assets:  Leisure Time Physical Health Resilience  ADL's:  Intact  Cognition:  WNL  Sleep:         Physical Exam: Physical Exam Vitals and nursing note reviewed.  Constitutional:      Appearance: Normal appearance.  HENT:     Head: Normocephalic.     Nose: Nose normal.  Pulmonary:     Effort: Pulmonary effort is normal.  Musculoskeletal:        General: Normal range of motion.     Cervical back: Normal range of motion.  Neurological:     General: No focal deficit present.     Mental Status: He is alert and oriented to person, place, and time.  Psychiatric:        Attention and Perception: Attention and perception normal.        Mood and Affect: Mood is anxious and depressed.        Speech: Speech normal.        Behavior: Behavior normal. Behavior is cooperative.  Thought Content: Thought content normal.        Cognition and Memory: Cognition and memory normal.        Judgment: Judgment normal.    Review of Systems  Psychiatric/Behavioral:  Positive for depression and substance abuse. The patient is nervous/anxious.   All other systems reviewed and are negative.  Blood pressure 136/83, pulse 63, temperature 98.4 F (36.9 C), temperature source Oral, resp. rate 16, height _0  (1.778 m), weight 85.7 kg, SpO2 93 %. Body mass index is 27.12 kg/m.  Treatment Plan Summary: Cocaine abuse with cocaine induced mood disorder: Started Paxil 10 mg daily  Disposition: Discharge home  Waylan Boga, NP 09/05/2022 10:48 AM

## 2022-09-08 ENCOUNTER — Other Ambulatory Visit: Payer: Self-pay

## 2022-09-08 ENCOUNTER — Encounter: Payer: Self-pay | Admitting: *Deleted

## 2022-09-08 DIAGNOSIS — Z1339 Encounter for screening examination for other mental health and behavioral disorders: Secondary | ICD-10-CM | POA: Diagnosis not present

## 2022-09-08 DIAGNOSIS — Z8546 Personal history of malignant neoplasm of prostate: Secondary | ICD-10-CM | POA: Insufficient documentation

## 2022-09-08 DIAGNOSIS — Z859 Personal history of malignant neoplasm, unspecified: Secondary | ICD-10-CM | POA: Insufficient documentation

## 2022-09-08 DIAGNOSIS — F1410 Cocaine abuse, uncomplicated: Secondary | ICD-10-CM | POA: Insufficient documentation

## 2022-09-08 DIAGNOSIS — Z79899 Other long term (current) drug therapy: Secondary | ICD-10-CM | POA: Insufficient documentation

## 2022-09-08 DIAGNOSIS — R45851 Suicidal ideations: Secondary | ICD-10-CM | POA: Diagnosis not present

## 2022-09-08 DIAGNOSIS — I10 Essential (primary) hypertension: Secondary | ICD-10-CM | POA: Diagnosis not present

## 2022-09-08 DIAGNOSIS — Z7982 Long term (current) use of aspirin: Secondary | ICD-10-CM | POA: Diagnosis not present

## 2022-09-08 LAB — COMPREHENSIVE METABOLIC PANEL
ALT: 24 U/L (ref 0–44)
AST: 37 U/L (ref 15–41)
Albumin: 3.5 g/dL (ref 3.5–5.0)
Alkaline Phosphatase: 66 U/L (ref 38–126)
Anion gap: 8 (ref 5–15)
BUN: 29 mg/dL — ABNORMAL HIGH (ref 8–23)
CO2: 22 mmol/L (ref 22–32)
Calcium: 9.2 mg/dL (ref 8.9–10.3)
Chloride: 106 mmol/L (ref 98–111)
Creatinine, Ser: 1.03 mg/dL (ref 0.61–1.24)
GFR, Estimated: >60 mL/min (ref >60)
Glucose, Bld: 105 mg/dL — ABNORMAL HIGH (ref 70–99)
Potassium: 4.8 mmol/L (ref 3.5–5.1)
Sodium: 136 mmol/L (ref 135–145)
Total Bilirubin: 0.5 mg/dL (ref 0.3–1.2)
Total Protein: 7.8 g/dL (ref 6.5–8.1)

## 2022-09-08 LAB — URINE DRUG SCREEN, QUALITATIVE (ARMC ONLY)
Amphetamines, Ur Screen: NOT DETECTED
Barbiturates, Ur Screen: NOT DETECTED
Benzodiazepine, Ur Scrn: NOT DETECTED
Cannabinoid 50 Ng, Ur ~~LOC~~: NOT DETECTED
Cocaine Metabolite,Ur ~~LOC~~: POSITIVE — AB
MDMA (Ecstasy)Ur Screen: NOT DETECTED
Methadone Scn, Ur: NOT DETECTED
Opiate, Ur Screen: NOT DETECTED
Phencyclidine (PCP) Ur S: NOT DETECTED
Tricyclic, Ur Screen: NOT DETECTED

## 2022-09-08 LAB — ETHANOL: Alcohol, Ethyl (B): <10 mg/dL (ref <10)

## 2022-09-08 LAB — ACETAMINOPHEN LEVEL: Acetaminophen (Tylenol), Serum: <10 ug/mL — ABNORMAL LOW (ref 10–30)

## 2022-09-08 LAB — SALICYLATE LEVEL: Salicylate Lvl: <7 mg/dL — ABNORMAL LOW (ref 7.0–30.0)

## 2022-09-08 LAB — CBC
HCT: 44.7 % (ref 39.0–52.0)
Hemoglobin: 14.3 g/dL (ref 13.0–17.0)
MCH: 27.5 pg (ref 26.0–34.0)
MCHC: 32 g/dL (ref 30.0–36.0)
MCV: 86 fL (ref 80.0–100.0)
Platelets: 194 10*3/uL (ref 150–400)
RBC: 5.2 MIL/uL (ref 4.22–5.81)
RDW: 14.6 % (ref 11.5–15.5)
WBC: 9.6 10*3/uL (ref 4.0–10.5)
nRBC: 0 % (ref 0.0–0.2)

## 2022-09-08 NOTE — ED Triage Notes (Signed)
Pt reports he PTSD and is having thoughts of hurting himself.   Pt denies etoh use.  Pt reports drug use.  Pt eating crackers and drinking water on arrival to ER.

## 2022-09-08 NOTE — ED Notes (Signed)
Red jacket  Blue shirt Blue hat Blue sneakers Gray underwear White socks Blue pants

## 2022-09-09 ENCOUNTER — Emergency Department
Admission: EM | Admit: 2022-09-09 | Discharge: 2022-09-09 | Disposition: A | Discharge status: Home / Self Care | Payer: No Typology Code available for payment source | Attending: Emergency Medicine | Admitting: Emergency Medicine

## 2022-09-09 DIAGNOSIS — F1410 Cocaine abuse, uncomplicated: Secondary | ICD-10-CM

## 2022-09-09 NOTE — ED Provider Notes (Signed)
[facility] Provider Note    Event Date/Time   First MD Initiated Contact with Patient 09/09/22 [phone]     (approximate)   History   Psychiatric Evaluation   HPI  Timothy Matthews is a 76 y.o. male with cocaine abuse, hepatitis C, hypertension who presents the emergency department for the third time in a week with complaints of suicidal thoughts.  Has been seen and cleared by psychiatry several times.  Continues to use cocaine.  Requesting something to eat.  No plan.  No HI.  No hallucinations.   History provided by patient.    Past Medical History:  Diagnosis Date   Cancer [facility])    Cocaine abuse ([place])    last ED encounter 04/07/20   Emphysema lung ([facility])    pan-lobar by CT 2017   GERD (gastroesophageal reflux disease)    Hepatitis C    Hypertension    Prostate cancer ([place]) BPH   PTSD (post-traumatic stress disorder)     Past Surgical History:  Procedure Laterality Date   CERVICAL SPINE SURGERY  2017   Done at [place]     TEE WITHOUT CARDIOVERSION N/A 10/26/2020   Procedure: TRANSESOPHAGEAL ECHOCARDIOGRAM (TEE);  Surgeon: Teodoro Spray, MD;  Location: [facility] ORS;  Service: Cardiovascular;  Laterality: N/A;   TONSILLECTOMY     TRANSURETHRAL RESECTION OF PROSTATE      MEDICATIONS:  Prior to Admission medications   Medication Sig Start Date End Date Taking? Authorizing Provider  albuterol (VENTOLIN HFA) 108 (90 Base) MCG/ACT inhaler Inhale 2 puffs into the lungs every 6 (six) hours as needed for wheezing or shortness of breath. Patient not taking: Reported on 09/03/2022 08/03/19   Rudene Re, MD  amLODipine (NORVASC) 10 MG tablet Take 10 mg by mouth daily.    [provider]  aspirin EC 81 MG EC tablet Take 1 tablet (81 mg total) by mouth daily. Swallow whole. 10/30/20   Shawna Clamp, MD  atorvastatin (LIPITOR) 40 MG tablet Take 1 tablet (40 mg total) by mouth daily. 10/30/20 09/03/22  Shawna Clamp, MD   cetirizine (ZYRTEC) 10 MG tablet Take 10 mg by mouth daily.    [provider]  cholecalciferol (VITAMIN D3) 25 MCG (1000 UNIT) tablet Take 2,000 Units by mouth daily.    [provider]  DULoxetine (CYMBALTA) 30 MG capsule Take 30 mg by mouth daily. 09/01/22   [provider]  gabapentin (NEURONTIN) 300 MG capsule Take 300 mg by mouth 3 (three) times daily. Patient not taking: Reported on 09/02/2022    [provider]  HYDROcodone-acetaminophen (NORCO/VICODIN) 5-325 MG tablet Take 1 tablet by mouth every 6 (six) hours as needed for moderate pain. Patient not taking: Reported on 09/02/2022 05/08/22 05/08/23  Johnn Hai, PA-C  omeprazole (PRILOSEC) 20 MG capsule Take 20 mg by mouth daily. 09/01/22   [provider]  sildenafil (VIAGRA) 100 MG tablet Take 100 mg by mouth daily as needed for erectile dysfunction.    [provider]  tamsulosin (FLOMAX) 0.4 MG CAPS capsule Take 0.8 mg by mouth.    [provider]  terazosin (HYTRIN) 10 MG capsule Take 10 mg by mouth at bedtime. Patient not taking: Reported on 09/03/2022 12/26/08   [provider]    Physical Exam   Triage Vital Signs: ED Triage Vitals  Enc Vitals Group     BP 09/08/22 1849 134/81     Pulse Rate 09/08/22 1849 68  Resp 09/08/22 1849 18     Temp 09/08/22 1849 98.1 F (36.7 C)     Temp Source 09/08/22 1849 Oral     SpO2 09/08/22 1849 96 %     Weight 09/08/22 1846 187 lb 6.3 oz (85 kg)     Height 09/08/22 1846 '5\' 10"'$  (1.778 m)     Head Circumference --      Peak Flow --      Pain Score 09/08/22 1846 0     Pain Loc --      Pain Edu? --      Excl. in [place]? --     Most recent vital signs: Vitals:   09/08/22 1849 09/09/22 0321  BP: 134/81 126/76  Pulse: 68 69  Resp: 18 18  Temp: 98.1 F (36.7 C) 97.7 F (36.5 C)  SpO2: 96% 97%    CONSTITUTIONAL: Alert and oriented and responds appropriately to questions.  Really, chronically  ill-appearing HEAD: Normocephalic, atraumatic EYES: Conjunctivae clear, pupils appear equal, sclera nonicteric ENT: normal nose; moist mucous membranes NECK: Supple, normal ROM CARD: RRR; S1 and S2 appreciated; no murmurs, no clicks, no rubs, no gallops RESP: Normal chest excursion without splinting or tachypnea; breath sounds clear and equal bilaterally; no wheezes, no rhonchi, no rales, no hypoxia or respiratory distress, speaking full sentences ABD/GI: Normal bowel sounds; non-distended; soft, non-tender, no rebound, no guarding, no peritoneal signs BACK: The back appears normal EXT: Normal ROM in all joints; no deformity noted, no edema; no cyanosis SKIN: Normal color for age and race; warm; no rash on exposed skin NEURO: Moves all extremities equally, normal speech, ambulates without difficulty PSYCH: The patient's mood and manner are appropriate.   ED Results / Procedures / Treatments   LABS: (all labs ordered are listed, but only abnormal results are displayed) Labs Reviewed  COMPREHENSIVE METABOLIC PANEL - Abnormal; Notable for the following components:      Result Value   Glucose, Bld 105 (*)    BUN 29 (*)    All other components within normal limits  SALICYLATE LEVEL - Abnormal; Notable for the following components:   Salicylate Lvl <0.3 (*)    All other components within normal limits  ACETAMINOPHEN LEVEL - Abnormal; Notable for the following components:   Acetaminophen (Tylenol), Serum <10 (*)    All other components within normal limits  URINE DRUG SCREEN, QUALITATIVE ([facility] ONLY) - Abnormal; Notable for the following components:   Cocaine Metabolite,Ur [place] POSITIVE (*)    All other components within normal limits  ETHANOL  CBC     EKG:  RADIOLOGY: My personal review and interpretation of imaging:    I have personally reviewed all radiology reports.   No results found.   PROCEDURES:  Critical Care performed: No    Procedures    IMPRESSION / MDM /  ASSESSMENT AND PLAN / ED COURSE  I reviewed the triage vital signs and the nursing notes.    Patient here with suicidal thoughts in the setting of continued cocaine abuse.     DIFFERENTIAL DIAGNOSIS (includes but not limited to):   Substance-induced mood disorder, doubt severe depression, intoxication and malingering also in the differential   Patient's presentation is most consistent with exacerbation of chronic illness.   PLAN: Patient here for chronic suicidal thoughts in the setting of cocaine abuse.  Labs and urine obtained from triage.  No leukocytosis, normal hemoglobin, normal electrolytes, renal function, LFTs.  Negative ethanol, tonsil slight level.  Cocaine positive.  Patient  is medically cleared.  At this time I do not feel there is any indication for emergent psychiatric evaluation or psychiatric admission as I feel they would be low utility to inpatient admission given patient has been seen and cleared by psychiatry several times for the same, has no active plan and symptoms seem to be related to his cocaine abuse.  Have instructed him once again to stop using cocaine and provided him with outpatient resources.  I feel he can be discharged.   MEDICATIONS GIVEN IN ED: Medications - No data to display   ED COURSE:  At this time, I do not feel there is any life-threatening condition present. I reviewed all nursing notes, vitals, pertinent previous records.  All lab and urine results, EKGs, imaging ordered have been independently reviewed and interpreted by myself.  I reviewed all available radiology reports from any imaging ordered this visit.  Based on my assessment, I feel the patient is safe to be discharged home without further emergent workup and can continue workup as an outpatient as needed. Discussed all findings, treatment plan as well as usual and customary return precautions.  They verbalize understanding and are comfortable with this plan.  Outpatient follow-up has been  provided as needed.  All questions have been answered.    CONSULTS:  none   OUTSIDE RECORDS REVIEWED: Reviewed patient's multiple previous psychiatric notes.       FINAL CLINICAL IMPRESSION(S) / ED DIAGNOSES   Final diagnoses:  Cocaine abuse ([place])     Rx / DC Orders   ED Discharge Orders     None        Note:  This document was prepared using Dragon voice recognition software and may include unintentional dictation errors.   Arnesia Vincelette, Delice Bison, DO 09/09/22 [phone]

## 2022-09-09 NOTE — ED Notes (Signed)
Pt belongings returned to pt and pt changed out of hospital-provided scrubs. This RN did note that pt threw away personal pair of socks. Pt given warm blanket, [place] sandwich tray, and discharge paperwork.

## 2022-09-09 NOTE — ED Notes (Signed)
Pt making verbal threats to harm staff at this time. As pt is marked for discharge and meets no criteria for continued medical assessments, [place] sandwith tray was offered to pt in attempt to console and satisfy pts unreasonable demands. This RN will call security to escort pt out with discharge paperwork.

## 2023-10-11 ENCOUNTER — Emergency Department
Admission: EM | Admit: 2023-10-11 | Discharge: 2023-10-12 | Disposition: A | Discharge status: Other Acute Inpatient Hospital | Payer: No Typology Code available for payment source | Attending: Emergency Medicine | Admitting: Emergency Medicine

## 2023-10-11 ENCOUNTER — Emergency Department: Payer: No Typology Code available for payment source

## 2023-10-11 ENCOUNTER — Other Ambulatory Visit: Payer: Self-pay

## 2023-10-11 ENCOUNTER — Emergency Department: Payer: Non-veteran care

## 2023-10-11 DIAGNOSIS — Z20822 Contact with and (suspected) exposure to covid-19: Secondary | ICD-10-CM | POA: Insufficient documentation

## 2023-10-11 DIAGNOSIS — C3490 Malignant neoplasm of unspecified part of unspecified bronchus or lung: Secondary | ICD-10-CM | POA: Diagnosis not present

## 2023-10-11 DIAGNOSIS — R0602 Shortness of breath: Secondary | ICD-10-CM | POA: Diagnosis present

## 2023-10-11 DIAGNOSIS — J90 Pleural effusion, not elsewhere classified: Secondary | ICD-10-CM | POA: Diagnosis not present

## 2023-10-11 DIAGNOSIS — Z8546 Personal history of malignant neoplasm of prostate: Secondary | ICD-10-CM | POA: Insufficient documentation

## 2023-10-11 DIAGNOSIS — I10 Essential (primary) hypertension: Secondary | ICD-10-CM | POA: Insufficient documentation

## 2023-10-11 LAB — COMPREHENSIVE METABOLIC PANEL
ALT: 13 U/L (ref 0–44)
AST: 26 U/L (ref 15–41)
Albumin: 2.4 g/dL — ABNORMAL LOW (ref 3.5–5.0)
Alkaline Phosphatase: 67 U/L (ref 38–126)
Anion gap: 12 (ref 5–15)
BUN: 17 mg/dL (ref 8–23)
CO2: 22 mmol/L (ref 22–32)
Calcium: 8.6 mg/dL — ABNORMAL LOW (ref 8.9–10.3)
Chloride: 98 mmol/L (ref 98–111)
Creatinine, Ser: 0.96 mg/dL (ref 0.61–1.24)
GFR, Estimated: >60 mL/min (ref >60)
Glucose, Bld: 136 mg/dL — ABNORMAL HIGH (ref 70–99)
Potassium: 3.8 mmol/L (ref 3.5–5.1)
Sodium: 132 mmol/L — ABNORMAL LOW (ref 135–145)
Total Bilirubin: 0.5 mg/dL (ref <1.2)
Total Protein: 7.5 g/dL (ref 6.5–8.1)

## 2023-10-11 LAB — CBC WITH DIFFERENTIAL/PLATELET
Abs Immature Granulocytes: 0.06 10*3/uL (ref 0.00–0.07)
Basophils Absolute: 0 10*3/uL (ref 0.0–0.1)
Basophils Relative: 0 %
Eosinophils Absolute: 0.1 10*3/uL (ref 0.0–0.5)
Eosinophils Relative: 0 %
HCT: 32.2 % — ABNORMAL LOW (ref 39.0–52.0)
Hemoglobin: 10.5 g/dL — ABNORMAL LOW (ref 13.0–17.0)
Immature Granulocytes: 0 %
Lymphocytes Relative: 9 %
Lymphs Abs: 1.2 10*3/uL (ref 0.7–4.0)
MCH: 25.4 pg — ABNORMAL LOW (ref 26.0–34.0)
MCHC: 32.6 g/dL (ref 30.0–36.0)
MCV: 78 fL — ABNORMAL LOW (ref 80.0–100.0)
Monocytes Absolute: 0.9 10*3/uL (ref 0.1–1.0)
Monocytes Relative: 6 %
Neutro Abs: 11.9 10*3/uL — ABNORMAL HIGH (ref 1.7–7.7)
Neutrophils Relative %: 85 %
Platelets: 446 10*3/uL — ABNORMAL HIGH (ref 150–400)
RBC: 4.13 MIL/uL — ABNORMAL LOW (ref 4.22–5.81)
RDW: 14.5 % (ref 11.5–15.5)
WBC: 14.2 10*3/uL — ABNORMAL HIGH (ref 4.0–10.5)
nRBC: 0 % (ref 0.0–0.2)

## 2023-10-11 LAB — TROPONIN I (HIGH SENSITIVITY): Troponin I (High Sensitivity): 16 ng/L (ref <18)

## 2023-10-11 LAB — RESP PANEL BY RT-PCR (RSV, FLU A&B, COVID)  RVPGX2
Influenza A by PCR: NEGATIVE
Influenza B by PCR: NEGATIVE
Resp Syncytial Virus by PCR: NEGATIVE
SARS Coronavirus 2 by RT PCR: NEGATIVE

## 2023-10-11 MED ORDER — ALBUTEROL SULFATE (2.5 MG/3ML) 0.083% IN NEBU
2.5000 mg | INHALATION_SOLUTION | Freq: Once | RESPIRATORY_TRACT | Status: AC
  Administered 2023-10-12: 2.5 mg via RESPIRATORY_TRACT
  Filled 2023-10-11: qty 3

## 2023-10-11 MED ORDER — SODIUM CHLORIDE 0.9 % IV BOLUS
500.0000 mL | Freq: Once | INTRAVENOUS | Status: AC
  Administered 2023-10-12: 500 mL via INTRAVENOUS

## 2023-10-11 MED ORDER — METHYLPREDNISOLONE SODIUM SUCC 125 MG IJ SOLR
125.0000 mg | Freq: Once | INTRAMUSCULAR | Status: AC
  Administered 2023-10-12: 125 mg via INTRAVENOUS
  Filled 2023-10-11: qty 2

## 2023-10-11 MED ORDER — SODIUM CHLORIDE 0.9 % IV SOLN
500.0000 mg | Freq: Once | INTRAVENOUS | Status: AC
  Administered 2023-10-12: 500 mg via INTRAVENOUS
  Filled 2023-10-11: qty 5

## 2023-10-11 NOTE — ED Provider Notes (Signed)
[facility] Provider Note    Event Date/Time   First MD Initiated Contact with Patient 10/11/23 2119     (approximate)   History   Chest Pain   HPI  Timothy Matthews is a 77 y.o. male with a history of emphysema, prostate cancer, hypertension, hepatitis C, and cocaine abuse who presents with bilateral anterior chest pain for the last 2 days associated with worsening shortness of breath, productive cough, and generalized weakness.  The patient states he is short of breath with minimal exertion such as walking a small distance.  He denies any fever.  He has no vomiting or diarrhea.  I reviewed the past medical records.  The patient was most recently seen in the ED in November of last year for suicidal ideation.  He was evaluated by psychiatry at that time and did not require inpatient admission.  The patient reports a history of a lung biopsy last Friday.   Physical Exam   Triage Vital Signs: ED Triage Vitals  Encounter Vitals Group     BP 10/11/23 1727 116/66     Systolic BP Percentile --      Diastolic BP Percentile --      Pulse Rate 10/11/23 1727 76     Resp 10/11/23 1727 (!) 22     Temp 10/11/23 1727 97.8 F (36.6 C)     Temp src --      SpO2 10/11/23 1727 94 %     Weight --      Height --      Head Circumference --      Peak Flow --      Pain Score 10/11/23 1728 10     Pain Loc --      Pain Education --      Exclude from Growth Chart --     Most recent vital signs: Vitals:   10/11/23 1727 10/11/23 2329  BP: 116/66 121/67  Pulse: 76 77  Resp: (!) 22 18  Temp: 97.8 F (36.6 C) 97.9 F (36.6 C)  SpO2: 94% 94%     General: Awake, no distress.  CV:  Good peripheral perfusion.  Resp:  Slightly increased effort.  Diminished breath sounds bilaterally. Abd:  No distention.  Other:  No peripheral edema.   ED Results / Procedures / Treatments   Labs (all labs ordered are listed, but only abnormal results are displayed) Labs  Reviewed  CBC WITH DIFFERENTIAL/PLATELET - Abnormal; Notable for the following components:      Result Value   WBC 14.2 (*)    RBC 4.13 (*)    Hemoglobin 10.5 (*)    HCT 32.2 (*)    MCV 78.0 (*)    MCH 25.4 (*)    Platelets 446 (*)    Neutro Abs 11.9 (*)    All other components within normal limits  COMPREHENSIVE METABOLIC PANEL - Abnormal; Notable for the following components:   Sodium 132 (*)    Glucose, Bld 136 (*)    Calcium 8.6 (*)    Albumin 2.4 (*)    All other components within normal limits  RESP PANEL BY RT-PCR (RSV, FLU A&B, COVID)  RVPGX2  LACTIC ACID, PLASMA  LACTIC ACID, PLASMA  TROPONIN I (HIGH SENSITIVITY)     EKG  ED ECG REPORT I, Dionne Bucy, the attending physician, personally viewed and interpreted this ECG.  Date: 10/11/2023 EKG Time: 1735 Rate: 76 Rhythm: normal sinus rhythm QRS Axis: normal Intervals: LAFB ST/T  Wave abnormalities: Nonspecific T wave abnormalities Narrative Interpretation: no evidence of acute ischemia    RADIOLOGY  Chest x-ray: I independently viewed and interpreted the images; there is a right hilar mass.  Radiology report indicates right upper lung collapse.  CT chest:  IMPRESSION:  1. 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  2. Trace right pleural effusion. No evidence of pneumothorax after  recent right lung biopsy.  3. Indeterminate 1.6 cm mean diameter subpleural right lower lobe  nodule. Metastatic disease or second synchronous lung cancer cannot  be excluded. Close attention on follow-up is warranted.  4. Borderline enlarged mediastinal adenopathy.  5. Aortic Atherosclerosis (ICD10-I70.0) and Emphysema (ICD10-J43.9).    PROCEDURES:  Critical Care performed: No  Procedures   MEDICATIONS ORDERED IN ED: Medications   azithromycin (ZITHROMAX) 500 mg in sodium chloride 0.9 % 250 mL IVPB (has no administration in time range)  albuterol (PROVENTIL) (2.5 MG/3ML) 0.083% nebulizer solution 2.5 mg (has no administration in time range)  albuterol (PROVENTIL) (2.5 MG/3ML) 0.083% nebulizer solution 2.5 mg (has no administration in time range)  methylPREDNISolone sodium succinate (SOLU-MEDROL) 125 mg/2 mL injection 125 mg (has no administration in time range)  sodium chloride 0.9 % bolus 500 mL (has no administration in time range)  sodium chloride 0.9 % bolus 500 mL (has no administration in time range)     IMPRESSION / MDM / ASSESSMENT AND PLAN / ED COURSE  I reviewed the triage vital signs and the nursing notes.  77 year old male with PMH as noted above presents with chest pain, increasing shortness of breath, productive cough for last several days after a lung biopsy last week.  On exam his vital signs are normal although he has shortness of breath with minimal exertion.  Chest x-ray shows possible right upper lung collapse.  Respiratory panel is negative.  WBC count is 14.  Other labs are unremarkable.  Differential diagnosis includes, but is not limited to, pneumothorax, other complication of biopsy, pleural effusion, worsening symptoms due to lung cancer, superimposed pneumonia, acute bronchitis, viral syndrome.  We will obtain CT chest for further evaluation.  Patient's presentation is most consistent with acute presentation with potential threat to life or bodily function.  The patient is on the cardiac monitor to evaluate for evidence of arrhythmia and/or significant heart rate changes.  ----------------------------------------- 11:58 PM on 10/11/2023 -----------------------------------------  CT chest shows consolidation consistent with known small cell lung cancer, small right pleural effusion, and a lower lobe nodule.  Clinically I am concerned for acute bronchitis versus pneumonia.  I discussed the  results of the workup with the patient.  Although his O2 saturation is in the 90s, he feels very short of breath and uncomfortable.  He does not feel like he is 1 of to go home.  I feel he will benefit from admission for antibiotics, steroid, IV fluids, and bronchodilators.  The patient is a [place] patient so I have filled out the paperwork to contact the [place] [facility].  [facility] will likely be [facility] here.  I have signed him out to the oncoming ED physician Dr. Elige Radon.   FINAL CLINICAL IMPRESSION(S) / ED DIAGNOSES   Final diagnoses:  Malignant neoplasm of lung, unspecified laterality, unspecified part of lung (HCC)  Pleural effusion     Rx /  DC Orders   ED Discharge Orders     None        Note:  This document was prepared using Dragon voice recognition software and may include unintentional dictation errors.    Dionne Bucy, MD 10/11/23 [phone]

## 2023-10-11 NOTE — ED Provider Triage Note (Signed)
Emergency Medicine Provider Triage Evaluation Note  Timothy Matthews , a 77 y.o. male  was evaluated in triage.  Pt complains of CP that began 2 days ago. Had lung biopsy on last Friday and reports intermittent pain since the biopsy.  Review of Systems  Positive: Productive cough, cp Negative:   Physical Exam  There were no vitals taken for this visit. Gen:   Awake, no distress   Resp:  Normal effort  MSK:   Moves extremities without difficulty  Other:    Medical Decision Making  Medically screening exam initiated at 5:26 PM.  Appropriate orders placed.  Timothy Matthews was informed that the remainder of the evaluation will be completed by another provider, this initial triage assessment does not replace that evaluation, and the importance of remaining in the ED until their evaluation is complete.     Timothy Ali, PA-C 10/11/23 1728

## 2023-10-11 NOTE — ED Triage Notes (Signed)
Pt c/o midsternal chest pain x2 days. Pt had lung biopsy last Friday and has been having intermittent pain since then. Pt endorses productive cough, denies fever. Pt denies previous cardiac hx.

## 2023-10-12 LAB — LACTIC ACID, PLASMA: Lactic Acid, Venous: 1.7 mmol/L (ref 0.5–1.9)

## 2024-02-08 ENCOUNTER — Emergency Department

## 2024-02-08 ENCOUNTER — Other Ambulatory Visit: Payer: Self-pay

## 2024-02-08 ENCOUNTER — Emergency Department
Admission: EM | Admit: 2024-02-08 | Discharge: 2024-02-08 | Disposition: A | Discharge status: Home / Self Care | Attending: Emergency Medicine | Admitting: Emergency Medicine

## 2024-02-08 ENCOUNTER — Encounter: Payer: Self-pay | Admitting: Emergency Medicine

## 2024-02-08 DIAGNOSIS — Z85118 Personal history of other malignant neoplasm of bronchus and lung: Secondary | ICD-10-CM | POA: Insufficient documentation

## 2024-02-08 DIAGNOSIS — R059 Cough, unspecified: Secondary | ICD-10-CM | POA: Diagnosis present

## 2024-02-08 DIAGNOSIS — J189 Pneumonia, unspecified organism: Secondary | ICD-10-CM | POA: Diagnosis not present

## 2024-02-08 DIAGNOSIS — R918 Other nonspecific abnormal finding of lung field: Secondary | ICD-10-CM | POA: Insufficient documentation

## 2024-02-08 LAB — RESP PANEL BY RT-PCR (RSV, FLU A&B, COVID)  RVPGX2
Influenza A by PCR: NEGATIVE
Influenza B by PCR: NEGATIVE
Resp Syncytial Virus by PCR: NEGATIVE
SARS Coronavirus 2 by RT PCR: NEGATIVE

## 2024-02-08 MED ORDER — AMOXICILLIN-POT CLAVULANATE 875-125 MG PO TABS: 1.0000 | ORAL_TABLET | Freq: Two times a day (BID) | ORAL | 0 refills | Status: AC

## 2024-02-08 MED ORDER — PSEUDOEPH-BROMPHEN-DM 30-2-10 MG/5ML PO SYRP: 5.0000 mL | ORAL_SOLUTION | Freq: Four times a day (QID) | ORAL | 0 refills | Status: DC | PRN

## 2024-02-08 MED ORDER — AMOXICILLIN-POT CLAVULANATE 875-125 MG PO TABS
1.0000 | ORAL_TABLET | Freq: Once | ORAL | Status: AC
  Administered 2024-02-08: 1 via ORAL
  Filled 2024-02-08: qty 1

## 2024-02-08 NOTE — ED Triage Notes (Signed)
 Pt presents to the ED via POV with complaints of cough x 1 month. Pt states that he is followed by Pulm at the [facility] for emphysema. Pt with a productive cough in triage. A&Ox4 at this time. Denies fevers, CP or SOB.

## 2024-02-08 NOTE — ED Provider Notes (Signed)
 [facility] Provider Note    Event Date/Time   First MD Initiated Contact with Patient 02/08/24 2201     (approximate)   History   Cough    HPI  Timothy Matthews is a 78 y.o. male     with a past medical history of lung cancer, pneumonia, who presents to the ED complaining of productive cough, chills, weight loss.  According to the patient, he had pneumonia in February and it was treated with azithromycin .  Patient states it did not resolve the symptoms.  Patient desires to have a referral to pulmonologist in [place].  He is unable to go to his appointments in [place]  due to transportation problems.      Physical Exam   Triage Vital Signs: ED Triage Vitals  Encounter Vitals Group     BP 02/08/24 2045 (!) 154/101     Systolic BP Percentile --      Diastolic BP Percentile --      Pulse Rate 02/08/24 2045 71     Resp 02/08/24 2045 18     Temp 02/08/24 2045 97.9 F (36.6 C)     Temp Source 02/08/24 2045 Oral     SpO2 02/08/24 2045 96 %     Weight 02/08/24 2045 160 lb (72.6 kg)     Height 02/08/24 2045 5\' 10"  (1.778 m)     Head Circumference --      Peak Flow --      Pain Score 02/08/24 2049 0     Pain Loc --      Pain Education --      Exclude from Growth Chart --     Most recent vital signs: Vitals:   02/08/24 2045  BP: (!) 154/101  Pulse: 71  Resp: 18  Temp: 97.9 F (36.6 C)  SpO2: 96%     Constitutional: Alert, NAD. Able to speak in complete sentences without cough or dyspnea  Eyes: Conjunctivae are normal.  Head: Atraumatic. Nose: No congestion/rhinnorhea. Mouth/Throat: Mucous membranes are moist.   Neck: Painless ROM. Supple. No JVD, nodes, thyromegaly  Cardiovascular:   Good peripheral circulation.RRR no murmurs, gallops, rubs  Respiratory: Normal respiratory effort.  No retractions.  Bronchial respiration in right upper lung.  No wheezing Gastrointestinal: Soft and nontender.  Musculoskeletal:  no  deformity Neurologic:  MAE spontaneously. No gross focal neurologic deficits are appreciated.  Skin:  Skin is warm, dry and intact. No rash noted. Psychiatric: Mood and affect are normal. Speech and behavior are normal.    ED Results / Procedures / Treatments   Labs (all labs ordered are listed, but only abnormal results are displayed) Labs Reviewed  RESP PANEL BY RT-PCR (RSV, FLU A&B, COVID)  RVPGX2     EKG     RADIOLOGY I independently reviewed and interpreted imaging and agree with radiologists findings.      PROCEDURES:  Critical Care performed:   Procedures   MEDICATIONS ORDERED IN ED: Medications  amoxicillin-clavulanate (AUGMENTIN) 875-125 MG per tablet 1 tablet (has no administration in time range)   Clinical Course as of 02/08/24 2327  Mon Feb 08, 2024  2247 Resp panel by RT-PCR (RSV, Flu A&B, Covid) Anterior Nasal Swab Negative [AE]  2248 DG Chest 2 View Persistent consolidation in the right upper lung although decreased in density since prior study. This corresponds to known right upper lung mass with postobstructive changes. Diffuse interstitial changes consistent with fibrosis as seen on prior CT.   [AE]  Clinical Course User Index [AE] Awilda Lennox, PA-C    IMPRESSION / MDM / ASSESSMENT AND PLAN / ED COURSE  I reviewed the triage vital signs and the nursing notes.  Differential diagnosis includes, but is not limited to, pneumonia, COVID, influenza, RSV, COPD   Patient's presentation is most consistent with acute complicated illness / injury requiring diagnostic workup.   Patient's diagnosis is consistent with pneumonia. I independently reviewed and interpreted imaging and agree with radiologists findings. I did review the patient's allergies and medications.The patient is in stable and satisfactory condition for discharge home.   Patient will be discharged home with prescriptions for pneumonia. Patient is to follow up with pulmonologist  as needed or otherwise directed. Patient is given ED precautions to return to the ED for any worsening or new symptoms. Discussed plan of care with patient, answered all of patient's questions, Patient agreeable to plan of care. Advised patient to take medications according to the instructions on the label. Discussed possible side effects of new medications. Patient verbalized understanding.    FINAL CLINICAL IMPRESSION(S) / ED DIAGNOSES   Final diagnoses:  Community acquired pneumonia of right upper lobe of lung  Mass of right lung     Rx / DC Orders   ED Discharge Orders          Ordered    amoxicillin-clavulanate (AUGMENTIN) 875-125 MG tablet  2 times daily        02/08/24 2325    brompheniramine-pseudoephedrine-DM 30-2-10 MG/5ML syrup  4 times daily PRN        02/08/24 2326             Note:  This document was prepared using Dragon voice recognition software and may include unintentional dictation errors.   Awilda Lennox, PA-C 02/08/24 2327    Arline Bennett, MD 02/08/24 [phone]

## 2024-02-08 NOTE — Discharge Instructions (Addendum)
 Have been diagnosed with pneumonia, mass of right lung.  Please take Augmentin 1 tablet by mouth every 12 hours after main meals for 7 days.  Drink plenty of fluids.  Please make an appointment with pulmonology to establish care and continue follow-up.  Please come back to ED if you have new symptoms or symptoms worsen.  Please take cough syrup.

## 2024-03-25 ENCOUNTER — Emergency Department

## 2024-03-25 ENCOUNTER — Inpatient Hospital Stay
Admission: EM | Admit: 2024-03-25 | Discharge: 2024-03-28 | DRG: 194 | Disposition: A | Discharge status: Home Health | Attending: Family Medicine | Admitting: Family Medicine

## 2024-03-25 ENCOUNTER — Other Ambulatory Visit: Payer: Self-pay

## 2024-03-25 ENCOUNTER — Encounter: Payer: Self-pay | Admitting: Internal Medicine

## 2024-03-25 DIAGNOSIS — I10 Essential (primary) hypertension: Secondary | ICD-10-CM | POA: Diagnosis present

## 2024-03-25 DIAGNOSIS — J440 Chronic obstructive pulmonary disease with acute lower respiratory infection: Secondary | ICD-10-CM | POA: Diagnosis present

## 2024-03-25 DIAGNOSIS — C3411 Malignant neoplasm of upper lobe, right bronchus or lung: Secondary | ICD-10-CM | POA: Diagnosis present

## 2024-03-25 DIAGNOSIS — Z8673 Personal history of transient ischemic attack (TIA), and cerebral infarction without residual deficits: Secondary | ICD-10-CM | POA: Diagnosis not present

## 2024-03-25 DIAGNOSIS — I639 Cerebral infarction, unspecified: Secondary | ICD-10-CM | POA: Diagnosis present

## 2024-03-25 DIAGNOSIS — R0600 Dyspnea, unspecified: Secondary | ICD-10-CM | POA: Diagnosis not present

## 2024-03-25 DIAGNOSIS — J850 Gangrene and necrosis of lung: Secondary | ICD-10-CM | POA: Diagnosis present

## 2024-03-25 DIAGNOSIS — F1414 Cocaine abuse with cocaine-induced mood disorder: Secondary | ICD-10-CM | POA: Diagnosis present

## 2024-03-25 DIAGNOSIS — N400 Enlarged prostate without lower urinary tract symptoms: Secondary | ICD-10-CM | POA: Diagnosis present

## 2024-03-25 DIAGNOSIS — J189 Pneumonia, unspecified organism: Secondary | ICD-10-CM | POA: Diagnosis present

## 2024-03-25 DIAGNOSIS — Z5982 Transportation insecurity: Secondary | ICD-10-CM | POA: Diagnosis not present

## 2024-03-25 DIAGNOSIS — J431 Panlobular emphysema: Secondary | ICD-10-CM | POA: Diagnosis not present

## 2024-03-25 DIAGNOSIS — F4310 Post-traumatic stress disorder, unspecified: Secondary | ICD-10-CM | POA: Diagnosis present

## 2024-03-25 DIAGNOSIS — R059 Cough, unspecified: Secondary | ICD-10-CM | POA: Diagnosis not present

## 2024-03-25 DIAGNOSIS — Z923 Personal history of irradiation: Secondary | ICD-10-CM

## 2024-03-25 DIAGNOSIS — F17210 Nicotine dependence, cigarettes, uncomplicated: Secondary | ICD-10-CM | POA: Diagnosis present

## 2024-03-25 DIAGNOSIS — Z9221 Personal history of antineoplastic chemotherapy: Secondary | ICD-10-CM | POA: Diagnosis not present

## 2024-03-25 DIAGNOSIS — Z7982 Long term (current) use of aspirin: Secondary | ICD-10-CM | POA: Diagnosis not present

## 2024-03-25 DIAGNOSIS — Z833 Family history of diabetes mellitus: Secondary | ICD-10-CM | POA: Diagnosis not present

## 2024-03-25 DIAGNOSIS — C3491 Malignant neoplasm of unspecified part of right bronchus or lung: Secondary | ICD-10-CM | POA: Insufficient documentation

## 2024-03-25 DIAGNOSIS — K219 Gastro-esophageal reflux disease without esophagitis: Secondary | ICD-10-CM | POA: Diagnosis present

## 2024-03-25 DIAGNOSIS — Z79899 Other long term (current) drug therapy: Secondary | ICD-10-CM

## 2024-03-25 DIAGNOSIS — Z888 Allergy status to other drugs, medicaments and biological substances status: Secondary | ICD-10-CM

## 2024-03-25 DIAGNOSIS — J439 Emphysema, unspecified: Secondary | ICD-10-CM | POA: Diagnosis present

## 2024-03-25 DIAGNOSIS — R54 Age-related physical debility: Secondary | ICD-10-CM | POA: Diagnosis present

## 2024-03-25 DIAGNOSIS — Z803 Family history of malignant neoplasm of breast: Secondary | ICD-10-CM

## 2024-03-25 DIAGNOSIS — J8410 Pulmonary fibrosis, unspecified: Secondary | ICD-10-CM | POA: Diagnosis present

## 2024-03-25 DIAGNOSIS — Z8546 Personal history of malignant neoplasm of prostate: Secondary | ICD-10-CM | POA: Diagnosis not present

## 2024-03-25 DIAGNOSIS — Z83511 Family history of glaucoma: Secondary | ICD-10-CM | POA: Diagnosis not present

## 2024-03-25 DIAGNOSIS — Z800 Family history of malignant neoplasm of digestive organs: Secondary | ICD-10-CM | POA: Diagnosis not present

## 2024-03-25 DIAGNOSIS — R918 Other nonspecific abnormal finding of lung field: Secondary | ICD-10-CM

## 2024-03-25 DIAGNOSIS — Z85118 Personal history of other malignant neoplasm of bronchus and lung: Secondary | ICD-10-CM | POA: Diagnosis not present

## 2024-03-25 LAB — BASIC METABOLIC PANEL WITH GFR
Anion gap: 8 (ref 5–15)
BUN: 27 mg/dL — ABNORMAL HIGH (ref 8–23)
CO2: 24 mmol/L (ref 22–32)
Calcium: 8.5 mg/dL — ABNORMAL LOW (ref 8.9–10.3)
Chloride: 99 mmol/L (ref 98–111)
Creatinine, Ser: 1.09 mg/dL (ref 0.61–1.24)
GFR, Estimated: >60 mL/min (ref >60)
Glucose, Bld: 133 mg/dL — ABNORMAL HIGH (ref 70–99)
Potassium: 3.9 mmol/L (ref 3.5–5.1)
Sodium: 131 mmol/L — ABNORMAL LOW (ref 135–145)

## 2024-03-25 LAB — CBC
HCT: 34 % — ABNORMAL LOW (ref 39.0–52.0)
Hemoglobin: 10.8 g/dL — ABNORMAL LOW (ref 13.0–17.0)
MCH: 25.8 pg — ABNORMAL LOW (ref 26.0–34.0)
MCHC: 31.8 g/dL (ref 30.0–36.0)
MCV: 81.1 fL (ref 80.0–100.0)
Platelets: 302 10*3/uL (ref 150–400)
RBC: 4.19 MIL/uL — ABNORMAL LOW (ref 4.22–5.81)
RDW: 16.8 % — ABNORMAL HIGH (ref 11.5–15.5)
WBC: 10.7 10*3/uL — ABNORMAL HIGH (ref 4.0–10.5)
nRBC: 0 % (ref 0.0–0.2)

## 2024-03-25 LAB — TROPONIN I (HIGH SENSITIVITY): Troponin I (High Sensitivity): 12 ng/L (ref <18)

## 2024-03-25 LAB — LACTIC ACID, PLASMA
Lactic Acid, Venous: 0.8 mmol/L (ref 0.5–1.9)
Lactic Acid, Venous: 1.1 mmol/L (ref 0.5–1.9)

## 2024-03-25 MED ORDER — MELATONIN 5 MG PO TABS: 5.0000 mg | ORAL_TABLET | Freq: Every evening | ORAL | Status: DC | PRN

## 2024-03-25 MED ORDER — SODIUM CHLORIDE 0.9 % IV SOLN
2.0000 g | INTRAVENOUS | Status: DC
  Administered 2024-03-26 – 2024-03-27 (×2): 2 g via INTRAVENOUS
  Filled 2024-03-25 (×3): qty 20

## 2024-03-25 MED ORDER — IPRATROPIUM-ALBUTEROL 0.5-2.5 (3) MG/3ML IN SOLN: 3.0000 mL | Freq: Four times a day (QID) | RESPIRATORY_TRACT | Status: DC | PRN

## 2024-03-25 MED ORDER — ACETAMINOPHEN 650 MG RE SUPP: 650.0000 mg | Freq: Four times a day (QID) | RECTAL | Status: DC | PRN

## 2024-03-25 MED ORDER — HEPARIN SODIUM (PORCINE) 5000 UNIT/ML IJ SOLN
5000.0000 [IU] | Freq: Three times a day (TID) | INTRAMUSCULAR | Status: DC
  Administered 2024-03-25 – 2024-03-28 (×8): 5000 [IU] via SUBCUTANEOUS
  Filled 2024-03-25 (×9): qty 1

## 2024-03-25 MED ORDER — HYDRALAZINE HCL 20 MG/ML IJ SOLN: 5.0000 mg | Freq: Four times a day (QID) | INTRAMUSCULAR | Status: DC | PRN

## 2024-03-25 MED ORDER — SODIUM CHLORIDE 0.9 % IV SOLN: 500.0000 mg | INTRAVENOUS | Status: DC

## 2024-03-25 MED ORDER — ONDANSETRON HCL 4 MG PO TABS: 4.0000 mg | ORAL_TABLET | Freq: Four times a day (QID) | ORAL | Status: DC | PRN

## 2024-03-25 MED ORDER — SODIUM CHLORIDE 0.9 % IV SOLN
500.0000 mg | Freq: Once | INTRAVENOUS | Status: DC
  Filled 2024-03-25: qty 5

## 2024-03-25 MED ORDER — ACETAMINOPHEN 325 MG PO TABS
650.0000 mg | ORAL_TABLET | Freq: Four times a day (QID) | ORAL | Status: AC | PRN
Start: 2024-03-25 — End: 2024-03-30

## 2024-03-25 MED ORDER — SODIUM CHLORIDE 0.9 % IV SOLN
2.0000 g | Freq: Once | INTRAVENOUS | Status: AC
  Administered 2024-03-25: 2 g via INTRAVENOUS
  Filled 2024-03-25: qty 20

## 2024-03-25 MED ORDER — IOHEXOL 350 MG/ML SOLN
75.0000 mL | Freq: Once | INTRAVENOUS | Status: AC | PRN
  Administered 2024-03-25: 75 mL via INTRAVENOUS

## 2024-03-25 MED ORDER — SENNOSIDES-DOCUSATE SODIUM 8.6-50 MG PO TABS: 1.0000 | ORAL_TABLET | Freq: Every evening | ORAL | Status: DC | PRN

## 2024-03-25 MED ORDER — ONDANSETRON HCL 4 MG/2ML IJ SOLN: 4.0000 mg | Freq: Four times a day (QID) | INTRAMUSCULAR | Status: DC | PRN

## 2024-03-25 NOTE — Assessment & Plan Note (Addendum)
 Possible acute exacerbation DuoNebs every 6 hours as needed for wheezing and shortness of breath

## 2024-03-25 NOTE — Hospital Course (Signed)
 Mr. Timothy Matthews is a 78 year old male with history of interstitial lung disease, lung cancer, history of cocaine use, history of CVA, who presents emergency department for chief concerns of shortness of breath and cough for 2 weeks.  Vitals in the ED showed T of 97.8, rr 20, heart rate 80, blood pressure 129/87, SpO2 99% on room air.  Serum sodium is 131, potassium 3.9, chloride 99, bicarb 24, BUN of 27, serum creatinine 1.09, EGFR greater than 60, nonfasting glucose 133, WBC 10.7, hemoglobin 10.8, platelets 302.  HS troponin is 12.  ED treatment: Azithromycin  500 mg IV one-time dose, ceftriaxone 2 g IV one-time dose.

## 2024-03-25 NOTE — Assessment & Plan Note (Addendum)
 With right sided infiltrating mass cutting into the right upper lobe bronchus Pulmonologist has been consulted via staff message for consideration of bronchoscopy Dr. Lucina Matthews is aware Continuous pulse oximetry Azithromycin  500 mg IV daily, ceftriaxone 2 g IV daily, to complete a 5-day course DuoNebs every 6 hours as needed for wheezing and shortness of breath

## 2024-03-25 NOTE — ED Triage Notes (Addendum)
 C/o cough and SOB x 2 weeks. Worsening. STates has lung cancer.  Not currently receiving Chemo or Radiation.  States unable to get transportation to [facility] to receive treatment.  Family states patient fell 2 days ago onto left side.  AAOx3.  Skin warm and dry. Voice clear and strong.  Strong productive cough. No SOB/DOE

## 2024-03-25 NOTE — Assessment & Plan Note (Signed)
 Primary lung malignancy

## 2024-03-25 NOTE — Assessment & Plan Note (Signed)
 Hydralazine 5 mg IV every 6 hours as needed for SBP > 170, 5 days ordered

## 2024-03-25 NOTE — ED Provider Notes (Signed)
 [facility] Provider Note    Event Date/Time   First MD Initiated Contact with Patient 03/25/24 1500     (approximate)   History   Shortness of Breath   HPI  Timothy Matthews is a 78 year old male with history of lung cancer not on treatment, CVA, cocaine use presenting to the emergency department for evaluation of cough and shortness of breath.  Patient reports he has had worsening cough and shortness of breath for the past 2 weeks.  Reports chills.  He states that he was diagnosed with lung cancer last year.  Underwent treatment at the [place] from December to March.  Has been struggling to coordinate follow-up and has had transportation issues getting to the [place].  Is interested in establishing care within our health system.     Physical Exam   Triage Vital Signs: ED Triage Vitals  Encounter Vitals Group     BP 03/25/24 1357 129/87     Girls Systolic BP Percentile --      Girls Diastolic BP Percentile --      Boys Systolic BP Percentile --      Boys Diastolic BP Percentile --      Pulse Rate 03/25/24 1357 88     Resp 03/25/24 1357 20     Temp 03/25/24 1357 97.8 F (36.6 C)     Temp Source 03/25/24 1357 Oral     SpO2 03/25/24 1357 99 %     Weight 03/25/24 1358 160 lb 0.9 oz (72.6 kg)     Height --      Head Circumference --      Peak Flow --      Pain Score 03/25/24 1358 0     Pain Loc --      Pain Education --      Exclude from Growth Chart --     Most recent vital signs: Vitals:   03/25/24 1425 03/25/24 1425  BP: (!) 147/81   Pulse: 89   Resp:    Temp:    SpO2: 98% 95%     General: Awake, interactive  CV:  Regular rate, good peripheral perfusion.  Resp:  Coarse lung sounds with fair air movement, no wheezing noted, cough present Abd:  Nondistended.  Neuro:  Symmetric facial movement, fluid speech   ED Results / Procedures / Treatments   Labs (all labs ordered are listed, but only abnormal results are displayed) Labs Reviewed   BASIC METABOLIC PANEL WITH GFR - Abnormal; Notable for the following components:      Result Value   Sodium 131 (*)    Glucose, Bld 133 (*)    BUN 27 (*)    Calcium  8.5 (*)    All other components within normal limits  CBC - Abnormal; Notable for the following components:   WBC 10.7 (*)    RBC 4.19 (*)    Hemoglobin 10.8 (*)    HCT 34.0 (*)    MCH 25.8 (*)    RDW 16.8 (*)    All other components within normal limits  CULTURE, BLOOD (ROUTINE X 2)  CULTURE, BLOOD (ROUTINE X 2)  LACTIC ACID, PLASMA  LACTIC ACID, PLASMA  TROPONIN I (HIGH SENSITIVITY)     EKG EKG independently reviewed and interpreted by myself demonstrates:  EKG demonstrates sinus rhythm at a rate of 87, PR 142, QRS 88, QTc 416, ectopy noted  RADIOLOGY Imaging independently reviewed and interpreted by myself demonstrates:  CXR demonstrates increasing areas of  opacity in the right upper lobe, radiology also notes areas of consolidation in the left mid lobe.  CTA of the chest without PE, but does demonstrate right upper lobe mass that appears progressive with associated opacities concerning for a postobstructive pneumonia  Formal Radiology Read:  CT Angio Chest PE W and/or Wo Contrast Result Date: 03/25/2024 CLINICAL DATA:  Pulmonary embolism (PE) suspected, high prob RUL consolidation on xr, hx lung cancer. * Tracking Code: BO * EXAM: CT ANGIOGRAPHY CHEST WITH CONTRAST TECHNIQUE: Multidetector CT imaging of the chest was performed using the standard protocol during bolus administration of intravenous contrast. Multiplanar CT image reconstructions and MIPs were obtained to evaluate the vascular anatomy. RADIATION DOSE REDUCTION: This exam was performed according to the departmental dose-optimization program which includes automated exposure control, adjustment of the mA and/or kV according to patient size and/or use of iterative reconstruction technique. CONTRAST:  75mL OMNIPAQUE  IOHEXOL  350 MG/ML SOLN COMPARISON:  CT  scan chest from 10/11/2023. FINDINGS: Cardiovascular: No embolism to the proximal subsegmental pulmonary artery level. Normal cardiac size. no pericardial effusion. No aortic aneurysm. Mediastinum/Nodes: Visualized thyroid  gland appears grossly unremarkable. There is infiltrating soft tissue predominantly centered around the right central hilum. Which has increased since the prior study. There is interval decrease in the previously seen precarinal and right paratracheal lymph nodes. Subcarinal lymph node also appears decreased in size since the prior study. No axillary or left hilar lymphadenopathy by size criteria. The esophagus is nondistended precluding optimal assessment. There is mild circumferential thickening of the lower thoracic esophagus, which is most likely seen in the settings of chronic gastroesophageal reflux disease versus esophagitis. Lungs/Pleura: Redemonstration of complete cut off of the right upper lobe bronchus secondary to and ill-defined infiltrating mass, present since the prior study. There is severe narrowing of the segmental pulmonary artery branch in the mass (series 4, image 57). There are heterogeneous opacities and several cavitary lesions in the right upper lobe, which are new since the prior study. Redemonstration of moderate to severe peripheral/subpleural reticulations with honeycombing and patchy areas of bronchiectasis, with lower lobe predominance on the right and upper lobe predominance on the left, concerning for interstitial lung disease. There is new heterogeneous opacity in the superior segment of right lower lobe (series 6, image 63), and posterior segment of right upper lobe (series 6, image 68), which are nonspecific and may represent focal pneumonia however, attention on follow-up examination is recommended to exclude neoplastic process. Rest of the lungs are otherwise clear. No mass, consolidation, pleural effusion or pneumothorax. No suspicious lung nodule. Upper  Abdomen: There is a small sliding hiatal hernia. Visualized upper abdominal viscera within normal limits. Musculoskeletal: The visualized soft tissues of the chest wall are grossly unremarkable. No suspicious osseous lesions. There are mild multilevel degenerative changes in the visualized spine. Posterior spinal fixation of T1 and T2 noted with transpedicular screws and rods. There is mild anterior wedging deformity of T9 and T11 vertebrae, similar to the prior study. IMPRESSION: 1. No embolism to the proximal subsegmental pulmonary artery level. 2. Redemonstration of complete cut off of the right upper lobe bronchus secondary to an ill-defined infiltrating mass, present since the prior study. There is interval increase in the infiltrating soft tissue in the right central hilum. 3. There is interval decrease in the size of previously seen mediastinal lymph nodes. 4. There are new heterogeneous opacities in the right upper lobe, which may represent postoperative pneumonia. There are several new cavitary lesions in the right upper lobe, which  may represent post infective pneumatoceles. 5. Redemonstration of findings suggestive of interstitial lung disease, as described above. 6. Multiple other nonacute observations, as described above. Electronically Signed   By: Beula Brunswick M.D.   On: 03/25/2024 16:33   DG Chest Port 1 View Result Date: 03/25/2024 CLINICAL DATA:  Shortness of breath EXAM: PORTABLE CHEST 1 VIEW COMPARISON:  X-Ronie Fleeger 01/31/2024 and older.  CT scan 10/11/2023. FINDINGS: Normal cardiopericardial silhouette. No pneumothorax or effusion. Underlying diffuse interstitial changes are again seen but there is some increasing areas of opacity. This includes a more confluent larger area of opacity in the right upper lobe as well as some patchy areas in the left mid lower lung. Acute infiltrates possible. Degenerative changes of the spine. Fixation hardware along the lower cervical spine. IMPRESSION:  Increasing areas of parenchymal opacity, most confluent in the right upper lobe but also patchy areas in left mid lower lung. Please correlate clinical presentation. Short follow-up versus additional CT workup as clinically appropriate. Electronically Signed   By: Adrianna Horde M.D.   On: 03/25/2024 15:05    PROCEDURES:  Critical Care performed: No  Procedures   MEDICATIONS ORDERED IN ED: Medications  cefTRIAXone (ROCEPHIN) 2 g in sodium chloride  0.9 % 100 mL IVPB (has no administration in time range)  azithromycin  (ZITHROMAX ) 500 mg in sodium chloride  0.9 % 250 mL IVPB (has no administration in time range)  iohexol  (OMNIPAQUE ) 350 MG/ML injection 75 mL (75 mLs Intravenous Contrast Given 03/25/24 1602)     IMPRESSION / MDM / ASSESSMENT AND PLAN / ED COURSE  I reviewed the triage vital signs and the nursing notes.  Differential diagnosis includes, but is not limited to, pneumonia, PE, progressive lung cancer, pneumothorax, ACS  Patient's presentation is most consistent with acute presentation with potential threat to life or bodily function.  78 year old male presenting with shortness of breath.  Stable vitals on presentation.  Mild leukocytosis noted.  Stable anemia.  CMP without critical derangements.  X-Jamika Sadek demonstrates right upper lobe consolidation.  Do note that patient had consolidation present on x-Lilyona Richner during visit here in April, does seem progressed from this.  Will obtain CT angio to further evaluate this on CT and also evaluate for pulmonary embolism given increased rest in the setting of malignancy.  1747 Without PE, but does demonstrate progressive lung mass with findings concerning for postobstructive pneumonia.  Does not meet SIRS criteria, but has had recurrent symptoms despite prior attempts at outpatient treatment.   In the setting of this, dill discussed with hospitalist team for admission. Ordered for empiric Rocephin and azithromycin .   1812 Case discussed with Dr.  Reinhold Carbine.  She will evaluate for anticipated admission.      FINAL CLINICAL IMPRESSION(S) / ED DIAGNOSES   Final diagnoses:  Pneumonia of right upper lobe due to infectious organism  Mass of upper lobe of right lung     Rx / DC Orders   ED Discharge Orders     None        Note:  This document was prepared using Dragon voice recognition software and may include unintentional dictation errors.   Claria Crofts, MD 03/25/24 [phone]

## 2024-03-25 NOTE — H&P (Addendum)
 History and Physical   Timothy Matthews ZOX:[id number] DOB: [birth year] DOA: 03/25/2024  PCP: Mortimer Area, MD  Patient coming from: Home  I have personally briefly reviewed patient's old medical records in [facility] EMR.  Chief Concern: Shortness of breath, cough  HPI: Mr. Timothy Matthews is a 78 year old male with history of interstitial lung disease, lung cancer, history of cocaine use, history of CVA, who presents emergency department for chief concerns of shortness of breath and cough for 2 weeks.  Vitals in the ED showed T of 97.8, rr 20, heart rate 80, blood pressure 129/87, SpO2 99% on room air.  Serum sodium is 131, potassium 3.9, chloride 99, bicarb 24, BUN of 27, serum creatinine 1.09, EGFR greater than 60, nonfasting glucose 133, WBC 10.7, hemoglobin 10.8, platelets 302.  HS troponin is 12.  ED treatment: Azithromycin  500 mg IV one-time dose, ceftriaxone 2 g IV one-time dose. ------------------------------------ At bedside, patient is able to tell me his first and last name, his age, location, current calendar year.  He reports he has been coughing and short of breath for a while.  He reports the sputum is thick and white.  He denies fever, chills, dysuria, chest pain, nausea, vomiting, hematuria, swelling of his lower extremity, syncope.  He reports he has not received chemo or radiation because it is too far away and he does not have a car.  Social history: He lives at home. He does not have vehicle and does not have reliable transportation. He is a former tobacco user, quitting cigarettes 1 week ago.  He denies EtOH and recreational drug use.  He is retired and disabled.  ROS: Constitutional: no weight change, no fever ENT/Mouth: no sore throat, no rhinorrhea Eyes: no eye pain, no vision changes Cardiovascular: no chest pain, + dyspnea,  no edema, no palpitations Respiratory: + cough, + sputum, no wheezing Gastrointestinal: no nausea, no vomiting, no diarrhea,  no constipation Genitourinary: no urinary incontinence, no dysuria, no hematuria Musculoskeletal: no arthralgias, no myalgias Skin: no skin lesions, no pruritus, Neuro: + weakness, no loss of consciousness, no syncope Psych: no anxiety, no depression, + decrease appetite Heme/Lymph: no bruising, no bleeding  ED Course: Discussed with EDP, patient requiring hospitalization for chief concerns of [facility] pneumonia.  Assessment/Plan  Principal Problem:   Community acquired pneumonia Active Problems:   CVA (cerebral vascular accident) (HCC)   Cocaine abuse with cocaine-induced mood disorder (HCC)   HTN (hypertension)   Emphysema lung (HCC)   GERD (gastroesophageal reflux disease)   Cancer of right lung (HCC)   Assessment and Plan:  * Community acquired pneumonia With right sided infiltrating mass cutting into the right upper lobe bronchus Pulmonologist has been consulted via staff message for consideration of bronchoscopy Dr. Lucina Sabal is aware Continuous pulse oximetry Azithromycin  500 mg IV daily, ceftriaxone 2 g IV daily, to complete a 5-day course DuoNebs every 6 hours as needed for wheezing and shortness of breath  HTN (hypertension) Hydralazine 5 mg IV every 6 hours as needed for SBP > 170, 5 days ordered  Emphysema lung (HCC) Possible acute exacerbation DuoNebs every 6 hours as needed for wheezing and shortness of breath  Cancer of right lung (HCC) Primary lung malignancy  Chart reviewed.   AM team to complete med reconciliation.  DVT prophylaxis: Heparin 5000 units subcutaneous every 8 hours Code Status: Full code Diet: Regular diet Family Communication: Sherrlyn Dolores, girlfriend was updated at bedside with patient's permission Disposition Plan: Pending course Consults called: Pulmonologist, Dr. Lucina Sabal  Admission status: Telemetry medical, inpatient  Past Medical History:  Diagnosis Date   Cancer [facility])    Cocaine abuse (HCC)    last ED encounter 04/07/20    Emphysema lung (HCC)    pan-lobar by CT 2017   GERD (gastroesophageal reflux disease)    Hepatitis C    Hypertension    Prostate cancer (HCC) BPH   PTSD (post-traumatic stress disorder)    Past Surgical History:  Procedure Laterality Date   CERVICAL SPINE SURGERY  2017   Done at [facility] SPINE SURGERY     TEE WITHOUT CARDIOVERSION N/A 10/26/2020   Procedure: TRANSESOPHAGEAL ECHOCARDIOGRAM (TEE);  Surgeon: Ronney Cola, MD;  Location: [facility] ORS;  Service: Cardiovascular;  Laterality: N/A;   TONSILLECTOMY     TRANSURETHRAL RESECTION OF PROSTATE     Social History:  reports that he has been smoking cigarettes. He has never used smokeless tobacco. He reports that he does not currently use alcohol . He reports current drug use. Drugs: Cocaine and Marijuana.  Allergies  Allergen Reactions   Trospium     Other Reaction(s): Blurring of visual image   Family History  Problem Relation Age of Onset   Breast cancer Mother    Diabetes Mother    Glaucoma Mother    Cancer Father    Alcohol  abuse Father    Pancreatic cancer Father    Family history: Family history reviewed and not pertinent.  Prior to Admission medications   Medication Sig Start Date End Date Taking? Authorizing Provider  albuterol  (VENTOLIN  HFA) 108 (90 Base) MCG/ACT inhaler Inhale 2 puffs into the lungs every 6 (six) hours as needed for wheezing or shortness of breath. Patient not taking: Reported on 09/03/2022 08/03/19   Isa Manuel, MD  amLODipine  (NORVASC ) 10 MG tablet Take 10 mg by mouth daily.    [provider]  aspirin  EC 81 MG EC tablet Take 1 tablet (81 mg total) by mouth daily. Swallow whole. 10/30/20   Magdalene School, MD  atorvastatin  (LIPITOR) 40 MG tablet Take 1 tablet (40 mg total) by mouth daily. 10/30/20 09/03/22  Magdalene School, MD  brompheniramine-pseudoephedrine-DM 30-2-10 MG/5ML syrup Take 5 mLs by mouth 4 (four) times daily as needed. 02/08/24   Awilda Lennox, PA-C  cetirizine  (ZYRTEC) 10 MG tablet Take 10 mg by mouth daily.    [provider]  cholecalciferol (VITAMIN D3) 25 MCG (1000 UNIT) tablet Take 2,000 Units by mouth daily.    [provider]  DULoxetine (CYMBALTA) 30 MG capsule Take 30 mg by mouth daily. 09/01/22   [provider]  gabapentin  (NEURONTIN ) 300 MG capsule Take 300 mg by mouth 3 (three) times daily. Patient not taking: Reported on 09/02/2022    [provider]  omeprazole (PRILOSEC) 20 MG capsule Take 20 mg by mouth daily. 09/01/22   [provider]  sildenafil (VIAGRA) 100 MG tablet Take 100 mg by mouth daily as needed for erectile dysfunction.    [provider]  tamsulosin  (FLOMAX ) 0.4 MG CAPS capsule Take 0.8 mg by mouth.    [provider]  terazosin (HYTRIN) 10 MG capsule Take 10 mg by mouth at bedtime. Patient not taking: Reported on 09/03/2022 12/26/08   [provider]   Physical Exam: Vitals:   03/25/24 1358 03/25/24 1425 03/25/24 1425 03/25/24 1824  BP:  (!) 147/81  131/74  Pulse:  89  83  Resp:    18  Temp:    99.7 F (37.6 C)  TempSrc:    Oral  SpO2:  98% 95% 97%  Weight: 72.6 kg      Constitutional: appears frail, malnourished Eyes: PERRL, lids and conjunctivae normal ENMT: Mucous membranes are moist. Posterior pharynx clear of any exudate or lesions. Age-appropriate dentition. Hearing appropriate Neck: normal, supple, no masses, no thyromegaly Respiratory: clear to auscultation bilaterally, no wheezing, no crackles. Normal respiratory effort. No accessory muscle use.  Cardiovascular: Regular rate and rhythm, no murmurs / rubs / gallops. No extremity edema. 2+ pedal pulses. No carotid bruits.  Abdomen: no tenderness, no masses palpated, no hepatosplenomegaly. Bowel sounds positive.  Musculoskeletal: no clubbing / cyanosis. No joint deformity upper and lower extremities. Good ROM, no contractures, no atrophy. Normal muscle tone.  Skin: no rashes, lesions,  ulcers. No induration Neurologic: Sensation intact. Strength 5/5 in all 4.  Psychiatric: Lacks judgment and insight. Alert and oriented x 3. Normal mood.   EKG: independently reviewed, showing sinus rhythm with rate of 87, QTc 416  Chest x-ray on Admission: I personally reviewed and I agree with radiologist reading as below.  CT Angio Chest PE W and/or Wo Contrast Result Date: 03/25/2024 CLINICAL DATA:  Pulmonary embolism (PE) suspected, high prob RUL consolidation on xr, hx lung cancer. * Tracking Code: BO * EXAM: CT ANGIOGRAPHY CHEST WITH CONTRAST TECHNIQUE: Multidetector CT imaging of the chest was performed using the standard protocol during bolus administration of intravenous contrast. Multiplanar CT image reconstructions and MIPs were obtained to evaluate the vascular anatomy. RADIATION DOSE REDUCTION: This exam was performed according to the departmental dose-optimization program which includes automated exposure control, adjustment of the mA and/or kV according to patient size and/or use of iterative reconstruction technique. CONTRAST:  75mL OMNIPAQUE  IOHEXOL  350 MG/ML SOLN COMPARISON:  CT scan chest from 10/11/2023. FINDINGS: Cardiovascular: No embolism to the proximal subsegmental pulmonary artery level. Normal cardiac size. no pericardial effusion. No aortic aneurysm. Mediastinum/Nodes: Visualized thyroid  gland appears grossly unremarkable. There is infiltrating soft tissue predominantly centered around the right central hilum. Which has increased since the prior study. There is interval decrease in the previously seen precarinal and right paratracheal lymph nodes. Subcarinal lymph node also appears decreased in size since the prior study. No axillary or left hilar lymphadenopathy by size criteria. The esophagus is nondistended precluding optimal assessment. There is mild circumferential thickening of the lower thoracic esophagus, which is most likely seen in the settings of chronic  gastroesophageal reflux disease versus esophagitis. Lungs/Pleura: Redemonstration of complete cut off of the right upper lobe bronchus secondary to and ill-defined infiltrating mass, present since the prior study. There is severe narrowing of the segmental pulmonary artery branch in the mass (series 4, image 57). There are heterogeneous opacities and several cavitary lesions in the right upper lobe, which are new since the prior study. Redemonstration of moderate to severe peripheral/subpleural reticulations with honeycombing and patchy areas of bronchiectasis, with lower lobe predominance on the right and upper lobe predominance on the left, concerning for interstitial lung disease. There is new heterogeneous opacity in the superior segment of right lower lobe (series 6, image 63), and posterior segment of right upper lobe (series 6, image 68), which are nonspecific and may represent focal pneumonia however, attention on follow-up examination is recommended to exclude neoplastic process. Rest of the lungs are otherwise clear. No mass, consolidation, pleural effusion or pneumothorax. No suspicious lung nodule. Upper Abdomen: There is a small sliding hiatal hernia. Visualized upper abdominal viscera within normal limits. Musculoskeletal: The visualized soft tissues of the  chest wall are grossly unremarkable. No suspicious osseous lesions. There are mild multilevel degenerative changes in the visualized spine. Posterior spinal fixation of T1 and T2 noted with transpedicular screws and rods. There is mild anterior wedging deformity of T9 and T11 vertebrae, similar to the prior study. IMPRESSION: 1. No embolism to the proximal subsegmental pulmonary artery level. 2. Redemonstration of complete cut off of the right upper lobe bronchus secondary to an ill-defined infiltrating mass, present since the prior study. There is interval increase in the infiltrating soft tissue in the right central hilum. 3. There is interval  decrease in the size of previously seen mediastinal lymph nodes. 4. There are new heterogeneous opacities in the right upper lobe, which may represent postoperative pneumonia. There are several new cavitary lesions in the right upper lobe, which may represent post infective pneumatoceles. 5. Redemonstration of findings suggestive of interstitial lung disease, as described above. 6. Multiple other nonacute observations, as described above. Electronically Signed   By: Beula Brunswick M.D.   On: 03/25/2024 16:33   DG Chest Port 1 View Result Date: 03/25/2024 CLINICAL DATA:  Shortness of breath EXAM: PORTABLE CHEST 1 VIEW COMPARISON:  X-ray 01/31/2024 and older.  CT scan 10/11/2023. FINDINGS: Normal cardiopericardial silhouette. No pneumothorax or effusion. Underlying diffuse interstitial changes are again seen but there is some increasing areas of opacity. This includes a more confluent larger area of opacity in the right upper lobe as well as some patchy areas in the left mid lower lung. Acute infiltrates possible. Degenerative changes of the spine. Fixation hardware along the lower cervical spine. IMPRESSION: Increasing areas of parenchymal opacity, most confluent in the right upper lobe but also patchy areas in left mid lower lung. Please correlate clinical presentation. Short follow-up versus additional CT workup as clinically appropriate. Electronically Signed   By: Adrianna Horde M.D.   On: 03/25/2024 15:05   Labs on Admission: I have personally reviewed following labs  CBC: Recent Labs  Lab 03/25/24 1359  WBC 10.7*  HGB 10.8*  HCT 34.0*  MCV 81.1  PLT 302   Basic Metabolic Panel: Recent Labs  Lab 03/25/24 1359  NA 131*  K 3.9  CL 99  CO2 24  GLUCOSE 133*  BUN 27*  CREATININE 1.09  CALCIUM  8.5*   GFR: Estimated Creatinine Clearance: 60.1 mL/min (by C-G formula based on SCr of 1.09 mg/dL).  Urine analysis:    Component Value Date/Time   COLORURINE YELLOW (A) 03/17/2022 1328    APPEARANCEUR CLOUDY (A) 03/17/2022 1328   APPEARANCEUR Clear 09/21/2014 1319   LABSPEC 1.021 03/17/2022 1328   LABSPEC 1.021 09/21/2014 1319   PHURINE 5.0 03/17/2022 1328   GLUCOSEU NEGATIVE 03/17/2022 1328   GLUCOSEU Negative 09/21/2014 1319   HGBUR LARGE (A) 03/17/2022 1328   BILIRUBINUR NEGATIVE 03/17/2022 1328   BILIRUBINUR Negative 09/21/2014 1319   KETONESUR NEGATIVE 03/17/2022 1328   PROTEINUR 100 (A) 03/17/2022 1328   NITRITE NEGATIVE 03/17/2022 1328   LEUKOCYTESUR LARGE (A) 03/17/2022 1328   LEUKOCYTESUR Negative 09/21/2014 1319   This document was prepared using Dragon Voice Recognition software and may include unintentional dictation errors.  Dr. Reinhold Carbine [facility]  If 7PM-7AM, please contact overnight-coverage provider If 7AM-7PM, please contact day attending provider [email or web address]  03/25/2024, 6:59 PM

## 2024-03-25 NOTE — ED Notes (Signed)
 Pt to CT

## 2024-03-26 DIAGNOSIS — C3411 Malignant neoplasm of upper lobe, right bronchus or lung: Secondary | ICD-10-CM

## 2024-03-26 DIAGNOSIS — I10 Essential (primary) hypertension: Secondary | ICD-10-CM

## 2024-03-26 DIAGNOSIS — F1414 Cocaine abuse with cocaine-induced mood disorder: Secondary | ICD-10-CM

## 2024-03-26 DIAGNOSIS — I639 Cerebral infarction, unspecified: Secondary | ICD-10-CM

## 2024-03-26 DIAGNOSIS — R059 Cough, unspecified: Secondary | ICD-10-CM

## 2024-03-26 DIAGNOSIS — R0600 Dyspnea, unspecified: Secondary | ICD-10-CM

## 2024-03-26 LAB — CBC
HCT: 32.3 % — ABNORMAL LOW (ref 39.0–52.0)
Hemoglobin: 10.4 g/dL — ABNORMAL LOW (ref 13.0–17.0)
MCH: 25.4 pg — ABNORMAL LOW (ref 26.0–34.0)
MCHC: 32.2 g/dL (ref 30.0–36.0)
MCV: 78.8 fL — ABNORMAL LOW (ref 80.0–100.0)
Platelets: 290 10*3/uL (ref 150–400)
RBC: 4.1 MIL/uL — ABNORMAL LOW (ref 4.22–5.81)
RDW: 16.6 % — ABNORMAL HIGH (ref 11.5–15.5)
WBC: 12.2 10*3/uL — ABNORMAL HIGH (ref 4.0–10.5)
nRBC: 0 % (ref 0.0–0.2)

## 2024-03-26 LAB — PROCALCITONIN: Procalcitonin: 0.13 ng/mL

## 2024-03-26 LAB — BASIC METABOLIC PANEL WITH GFR
Anion gap: 8 (ref 5–15)
BUN: 19 mg/dL (ref 8–23)
CO2: 25 mmol/L (ref 22–32)
Calcium: 8.6 mg/dL — ABNORMAL LOW (ref 8.9–10.3)
Chloride: 100 mmol/L (ref 98–111)
Creatinine, Ser: 0.95 mg/dL (ref 0.61–1.24)
GFR, Estimated: >60 mL/min (ref >60)
Glucose, Bld: 102 mg/dL — ABNORMAL HIGH (ref 70–99)
Potassium: 4.1 mmol/L (ref 3.5–5.1)
Sodium: 133 mmol/L — ABNORMAL LOW (ref 135–145)

## 2024-03-26 LAB — EXPECTORATED SPUTUM ASSESSMENT W GRAM STAIN, RFLX TO RESP C

## 2024-03-26 LAB — LACTATE DEHYDROGENASE: LDH: 123 U/L (ref 98–192)

## 2024-03-26 LAB — MRSA NEXT GEN BY PCR, NASAL: MRSA by PCR Next Gen: NOT DETECTED

## 2024-03-26 MED ORDER — SODIUM CHLORIDE 0.9 % IV SOLN
500.0000 mg | INTRAVENOUS | Status: AC
  Administered 2024-03-26 – 2024-03-27 (×2): 500 mg via INTRAVENOUS
  Filled 2024-03-26 (×2): qty 5

## 2024-03-26 NOTE — Progress Notes (Signed)
 PROGRESS NOTE    Timothy Matthews  ZOX:[id number] DOB: Mar 04, 1946 DOA: 03/25/2024 PCP: Mortimer Area, MD  Chief Complaint  Patient presents with   Shortness of Breath    Hospital Course:  Timothy Matthews is 78 year old man with history of interstitial lung disease, lung cancer, cocaine abuse, history of prior CVA, presents to the ED with shortness of breath and cough.  Vital signs in the ED mostly unremarkable.  He was given azithromycin  and ceftriaxone.  Patient is a poor historian.  Is difficult to ascertain what cancer therapy he has received.  He reports he has completed radiation but he cannot expand upon his chemotherapy.  CT scan reveals right-sided infiltrating mass into the right upper lobe bronchus.  Pulmonology was consulted.  Subjective: This morning patient is found sleeping with his friend in bed.  He denies any acute complaint   Objective: Vitals:   03/26/24 0425 03/26/24 0728 03/26/24 1142 03/26/24 1521  BP: 126/83 (!) 109/59 124/76 134/81  Pulse: 79 75 75 83  Resp: 20 17 18 16   Temp: 98.4 F (36.9 C) 99.3 F (37.4 C) 98.5 F (36.9 C) 99.2 F (37.3 C)  TempSrc:  Oral Oral Oral  SpO2: 95% 100% 94% 100%  Weight:        Intake/Output Summary (Last 24 hours) at 03/26/2024 1609 Last data filed at 03/26/2024 1016 Gross per 24 hour  Intake 520 ml  Output --  Net 520 ml   Filed Weights   03/25/24 1358  Weight: 72.6 kg    Examination: General exam: Appears calm and comfortable, NAD  Respiratory system: No work of breathing, symmetric chest wall expansion Cardiovascular system: S1 & S2 heard, RRR.  Gastrointestinal system: Abdomen is nondistended, soft and nontender.  Neuro: Alert and oriented. No focal neurological deficits. Extremities: Symmetric, expected ROM Skin: No rashes, lesions Psychiatry: Demonstrates poor judgment and insight.  Calm affect, mood congruent  Assessment & Plan:  Principal Problem:   Community acquired pneumonia Active  Problems:   CVA (cerebral vascular accident) (HCC)   Cocaine abuse with cocaine-induced mood disorder (HCC)   HTN (hypertension)   Emphysema lung (HCC)   GERD (gastroesophageal reflux disease)   Cancer of right lung (HCC)   Postobstructive pneumonia Right sided lung cancer - Patient receives much of his care from the [place].  Appears he has undergone radiation therapy but has not consistently attended chemotherapy. - Pulmonology is consulted, tentatively planning for bronchoscopy on Tuesday -Sputum culture, MRSA swab, procal, urine strep and Legionella, procal, LDH, beta D glucan have been ordered - For now continue with ceftriaxone azithromycin  - Appears patient receives his cancer care through [place] so record review was somewhat limited. Patient is poor historian, unable to expand upon current cancer care  Continue with as needed DuoNebs - Symptomatic support  Hypertension - Continue home meds  COPD - No wheezing appreciated on exam today.  No oxygen requirement at this time - Continue with as needed DuoNebs and symptomatic support  Tobacco abuse - Patient reports he quit smoking 1 week ago  Polysubstance abuse - Patient is actively smoking crack cocaine which is contributing to presentation as above.  He has been counseled on cessation   DVT prophylaxis: heparin   Code Status: Full Code Disposition: Likely bronchoscopy on Tuesday.  PT eval's pending to help with dispo planning  Consultants:  Treatment Team:  Consulting Physician: Annitta Kindler, MD  Procedures:    Antimicrobials:  Anti-infectives (From admission, onward)    Start  Dose/Rate Route Frequency Ordered Stop   03/26/24 2000  azithromycin  (ZITHROMAX ) 500 mg in sodium chloride  0.9 % 250 mL IVPB  Status:  Discontinued        500 mg 250 mL/hr over 60 Minutes Intravenous Every 24 hours 03/25/24 1827 03/26/24 0724   03/26/24 1800  cefTRIAXone (ROCEPHIN) 2 g in sodium chloride  0.9 % 100 mL IVPB        2  g 200 mL/hr over 30 Minutes Intravenous Every 24 hours 03/25/24 1825 03/30/24 1759   03/26/24 1000  azithromycin  (ZITHROMAX ) 500 mg in sodium chloride  0.9 % 250 mL IVPB        500 mg 250 mL/hr over 60 Minutes Intravenous Every 24 hours 03/26/24 0724 03/31/24 0959   03/25/24 1800  cefTRIAXone (ROCEPHIN) 2 g in sodium chloride  0.9 % 100 mL IVPB        2 g 200 mL/hr over 30 Minutes Intravenous Once 03/25/24 1755 03/25/24 1901   03/25/24 1800  azithromycin  (ZITHROMAX ) 500 mg in sodium chloride  0.9 % 250 mL IVPB  Status:  Discontinued        500 mg 250 mL/hr over 60 Minutes Intravenous  Once 03/25/24 1755 03/26/24 0724       Data Reviewed: I have personally reviewed following labs and imaging studies CBC: Recent Labs  Lab 03/25/24 1359 03/26/24 0557  WBC 10.7* 12.2*  HGB 10.8* 10.4*  HCT 34.0* 32.3*  MCV 81.1 78.8*  PLT 302 290   Basic Metabolic Panel: Recent Labs  Lab 03/25/24 1359 03/26/24 0557  NA 131* 133*  K 3.9 4.1  CL 99 100  CO2 24 25  GLUCOSE 133* 102*  BUN 27* 19  CREATININE 1.09 0.95  CALCIUM  8.5* 8.6*   GFR: Estimated Creatinine Clearance: 69 mL/min (by C-G formula based on SCr of 0.95 mg/dL). Liver Function Tests: No results for input(s): AST, ALT, ALKPHOS, BILITOT, PROT, ALBUMIN in the last 168 hours. CBG: No results for input(s): GLUCAP in the last 168 hours.  Recent Results (from the past 240 hours)  Blood Culture (routine x 2)     Status: None (Preliminary result)   Collection Time: 03/25/24  6:39 PM   Specimen: BLOOD  Result Value Ref Range Status   Specimen Description BLOOD BLOOD RIGHT FOREARM  Final   Special Requests   Final    BOTTLES DRAWN AEROBIC AND ANAEROBIC Blood Culture results may not be optimal due to an inadequate volume of blood received in culture bottles   Culture   Final    NO GROWTH < 12 HOURS Performed at [facility], [address] [place], [place] [address]    Report Status PENDING  Incomplete   Blood Culture (routine x 2)     Status: None (Preliminary result)   Collection Time: 03/25/24  6:39 PM   Specimen: BLOOD  Result Value Ref Range Status   Specimen Description BLOOD BLOOD RIGHT WRIST  Final   Special Requests   Final    BOTTLES DRAWN AEROBIC AND ANAEROBIC Blood Culture results may not be optimal due to an inadequate volume of blood received in culture bottles   Culture   Final    NO GROWTH < 12 HOURS Performed at [facility], [address] [place], [place] [address]    Report Status PENDING  Incomplete     Radiology Studies: CT Angio Chest PE W and/or Wo Contrast Result Date: 03/25/2024 CLINICAL DATA:  Pulmonary embolism (PE) suspected, high prob RUL consolidation on xr,  hx lung cancer. * Tracking Code: BO * EXAM: CT ANGIOGRAPHY CHEST WITH CONTRAST TECHNIQUE: Multidetector CT imaging of the chest was performed using the standard protocol during bolus administration of intravenous contrast. Multiplanar CT image reconstructions and MIPs were obtained to evaluate the vascular anatomy. RADIATION DOSE REDUCTION: This exam was performed according to the departmental dose-optimization program which includes automated exposure control, adjustment of the mA and/or kV according to patient size and/or use of iterative reconstruction technique. CONTRAST:  75mL OMNIPAQUE  IOHEXOL  350 MG/ML SOLN COMPARISON:  CT scan chest from 10/11/2023. FINDINGS: Cardiovascular: No embolism to the proximal subsegmental pulmonary artery level. Normal cardiac size. no pericardial effusion. No aortic aneurysm. Mediastinum/Nodes: Visualized thyroid  gland appears grossly unremarkable. There is infiltrating soft tissue predominantly centered around the right central hilum. Which has increased since the prior study. There is interval decrease in the previously seen precarinal and right paratracheal lymph nodes. Subcarinal lymph node also appears decreased in size since the prior study. No axillary or left  hilar lymphadenopathy by size criteria. The esophagus is nondistended precluding optimal assessment. There is mild circumferential thickening of the lower thoracic esophagus, which is most likely seen in the settings of chronic gastroesophageal reflux disease versus esophagitis. Lungs/Pleura: Redemonstration of complete cut off of the right upper lobe bronchus secondary to and ill-defined infiltrating mass, present since the prior study. There is severe narrowing of the segmental pulmonary artery branch in the mass (series 4, image 57). There are heterogeneous opacities and several cavitary lesions in the right upper lobe, which are new since the prior study. Redemonstration of moderate to severe peripheral/subpleural reticulations with honeycombing and patchy areas of bronchiectasis, with lower lobe predominance on the right and upper lobe predominance on the left, concerning for interstitial lung disease. There is new heterogeneous opacity in the superior segment of right lower lobe (series 6, image 63), and posterior segment of right upper lobe (series 6, image 68), which are nonspecific and may represent focal pneumonia however, attention on follow-up examination is recommended to exclude neoplastic process. Rest of the lungs are otherwise clear. No mass, consolidation, pleural effusion or pneumothorax. No suspicious lung nodule. Upper Abdomen: There is a small sliding hiatal hernia. Visualized upper abdominal viscera within normal limits. Musculoskeletal: The visualized soft tissues of the chest wall are grossly unremarkable. No suspicious osseous lesions. There are mild multilevel degenerative changes in the visualized spine. Posterior spinal fixation of T1 and T2 noted with transpedicular screws and rods. There is mild anterior wedging deformity of T9 and T11 vertebrae, similar to the prior study. IMPRESSION: 1. No embolism to the proximal subsegmental pulmonary artery level. 2. Redemonstration of complete  cut off of the right upper lobe bronchus secondary to an ill-defined infiltrating mass, present since the prior study. There is interval increase in the infiltrating soft tissue in the right central hilum. 3. There is interval decrease in the size of previously seen mediastinal lymph nodes. 4. There are new heterogeneous opacities in the right upper lobe, which may represent postoperative pneumonia. There are several new cavitary lesions in the right upper lobe, which may represent post infective pneumatoceles. 5. Redemonstration of findings suggestive of interstitial lung disease, as described above. 6. Multiple other nonacute observations, as described above. Electronically Signed   By: Beula Brunswick M.D.   On: 03/25/2024 16:33   DG Chest Port 1 View Result Date: 03/25/2024 CLINICAL DATA:  Shortness of breath EXAM: PORTABLE CHEST 1 VIEW COMPARISON:  X-ray 01/31/2024 and older.  CT scan 10/11/2023. FINDINGS:  Normal cardiopericardial silhouette. No pneumothorax or effusion. Underlying diffuse interstitial changes are again seen but there is some increasing areas of opacity. This includes a more confluent larger area of opacity in the right upper lobe as well as some patchy areas in the left mid lower lung. Acute infiltrates possible. Degenerative changes of the spine. Fixation hardware along the lower cervical spine. IMPRESSION: Increasing areas of parenchymal opacity, most confluent in the right upper lobe but also patchy areas in left mid lower lung. Please correlate clinical presentation. Short follow-up versus additional CT workup as clinically appropriate. Electronically Signed   By: Adrianna Horde M.D.   On: 03/25/2024 15:05    Scheduled Meds:  heparin  5,000 Units Subcutaneous Q8H   Continuous Infusions:  azithromycin  500 mg (03/26/24 0949)   cefTRIAXone (ROCEPHIN)  IV       LOS: 1 day  MDM: Patient is high risk for one or more organ failure.  They necessitate ongoing hospitalization for  continued IV therapies and subsequent lab monitoring. Total time spent interpreting labs and vitals, reviewing the medical record, coordinating care amongst consultants and care team members, directly assessing and discussing care with the patient and/or family: 55 min October Peery, DO [facility]  To contact the attending physician between 7A-7P please use Epic Chat. To contact the covering physician during after hours 7P-7A, please review Amion.  03/26/2024, 4:09 PM   *This document has been created with the assistance of dictation software. Please excuse typographical errors. *

## 2024-03-26 NOTE — Consult Note (Signed)
 NAME:  Timothy Matthews, MRN:  [id number], DOB:  [birth year], LOS: 1 ADMISSION DATE:  03/25/2024   History of Present Illness:  Mr. Timothy Matthews is a 78 year old male patient with a past medical history of tobacco use, polysubstance use specifically smoking crack, recent diagnosis of right upper lobe lung cancer being managed at the [place] no documentation on file presenting to [facility] on 06/13 with ongoing shortness of breath and cough with phlegm for months.  Pulmonary team is being consulted for help with further evaluation.  He reports that for months he has been coughing up phlegm yellowish mostly.  He also describes worsening shortness of breath mostly on exertion but now at rest.  This has worsened for the past 2 weeks.  Unfortunately Mr. Timothy Matthews continues to smoke tobacco and inhaled crack.  He also reports that he he is being managed for lung cancer at the [place] and his last radiation therapy was in March 2025 and his last chemotherapy was at the same time and only received 3 cycles.  Unclear if he continues to follow-up and he is compliant with follow-ups.  Labs: Mild leukocytosis with WBC of 10.7, hyponatremia with sodium of 131, kidney function appears to be at baseline of 1 mg/dL.  He does report low-grade fever at home and did have low-grade fever here of 100.7.  CTA chest on 03/25/2024 did not show any pulmonary embolism.  But did show right upper lobe cavitary lesion with increased consolidative opacification, also demonstrate interlobular septal thickening specifically in the lower lobe with reticular opacities in the right lower lobe.  There is also chronic left upper lobe honeycombing anteriorly distributed.  He does have a CT chest in December 2024 I think that was at the time of diagnosis of his cancer showed Right upper lobe complete opacification with cut off sign in the right upper lobe bronchs. Fibrotic changes with honeycombing and reticular opacities in the left upper lobe and right lower  lobe were redemonstrated.   Pertinent  Medical History  As above  Significant Hospital Events: Including procedures, antibiotic start and stop dates in addition to other pertinent events   03/26/2024 - Patient on room air.   Objective    Blood pressure 134/81, pulse 83, temperature 99.2 F (37.3 C), temperature source Oral, resp. rate 16, weight 72.6 kg, SpO2 100%.        Intake/Output Summary (Last 24 hours) at 03/26/2024 1843 Last data filed at 03/26/2024 1016 Gross per 24 hour  Intake 520 ml  Output --  Net 520 ml   Filed Weights   03/25/24 1358  Weight: 72.6 kg    Examination: General: NAD HENT: Supple neck, reactive pupils, poor dental hygiene Lungs: Diffuse crackles appreciated Cardiovascular: Normal S1, Normal S2, RRR Abdomen: Soft, non tender, non distended, +BS Extremities: Warm and well perfused no edema.   Labs and imaging were reviewed.   Assessment and Plan  Mr. Timothy Matthews is a 78 year old male patient with a past medical history of tobacco use, polysubstance use specifically smoking crack, recent diagnosis of right upper lobe lung cancer being managed at the [place] no documentation on file presenting to [facility] on 06/13 with ongoing shortness of breath and cough with phlegm for months.  Pulmonary team is being consulted for help with further evaluation.  His presentation of worsening cough with sputum production and low-grade fever could be representing an infectious process.  However he does have however he does have a right upper lobe cavitary lesion with multiple pneumatoceles which  could be secondary to his known lung cancer and radiation therapy.  Furthermore he did have signs of interstitial lung disease with fibrotic changes in the left upper lobe and right lower lobe.  This could represent UIP versus connective tissue disease associated ILD versus fibrotic HP or NSIP.   Evaluation for infectious workup including sputum culture and Gram stain, urine strep and  Legionella, respiratory viral panel, MRSA swab, procalcitonin, LDH and beta D glucan should be obtained.  I agree with azithromycin  and ceftriaxone for antibiotic coverage.  Agree with DuoNebs every 6 hours scheduled.  He might have a component of COPD with chronic bronchitis.  Finally I will discuss with Dr. Darnelle Elders for bronchoscopy and BAL on Tuesday.   I spent 80 minutes caring for this patient today, including preparing to see the patient, obtaining a medical history , reviewing a separately obtained history, performing a medically appropriate examination and/or evaluation, counseling and educating the patient/family/caregiver, ordering medications, tests, or procedures, documenting clinical information in the electronic health record, and independently interpreting results (not separately reported/billed) and communicating results to the patient/family/caregiver  Timothy Kindler, MD [facility] 03/26/2024 6:49 PM

## 2024-03-26 NOTE — Plan of Care (Signed)
  Problem: Health Behavior/Discharge Planning: Goal: Ability to manage health-related needs will improve Outcome: Progressing   Problem: Clinical Measurements: Goal: Diagnostic test results will improve Outcome: Progressing   Problem: Coping: Goal: Level of anxiety will decrease Outcome: Progressing   Problem: Skin Integrity: Goal: Risk for impaired skin integrity will decrease Outcome: Progressing   Problem: Respiratory: Goal: Ability to maintain a clear airway will improve Outcome: Progressing

## 2024-03-26 NOTE — Plan of Care (Signed)
  Problem: Education: Goal: Knowledge of General Education information will improve Description: Including pain rating scale, medication(s)/side effects and non-pharmacologic comfort measures Outcome: Progressing   Problem: Clinical Measurements: Goal: Ability to maintain clinical measurements within normal limits will improve Outcome: Progressing   Problem: Activity: Goal: Risk for activity intolerance will decrease Outcome: Progressing   Problem: Nutrition: Goal: Adequate nutrition will be maintained Outcome: Progressing   Problem: Coping: Goal: Level of anxiety will decrease Outcome: Progressing   Problem: Elimination: Goal: Will not experience complications related to bowel motility Outcome: Progressing   Problem: Pain Managment: Goal: General experience of comfort will improve and/or be controlled Outcome: Progressing   Problem: Safety: Goal: Ability to remain free from injury will improve Outcome: Progressing   Problem: Skin Integrity: Goal: Risk for impaired skin integrity will decrease Outcome: Progressing   Problem: Education: Goal: Knowledge of disease or condition will improve Outcome: Progressing   Problem: Activity: Goal: Ability to tolerate increased activity will improve Outcome: Progressing   Problem: Respiratory: Goal: Ability to maintain a clear airway will improve Outcome: Progressing

## 2024-03-27 DIAGNOSIS — K219 Gastro-esophageal reflux disease without esophagitis: Secondary | ICD-10-CM

## 2024-03-27 DIAGNOSIS — J431 Panlobular emphysema: Secondary | ICD-10-CM

## 2024-03-27 LAB — RESPIRATORY PANEL BY PCR

## 2024-03-27 LAB — STREP PNEUMONIAE URINARY ANTIGEN: Strep Pneumo Urinary Antigen: NEGATIVE

## 2024-03-27 MED ORDER — AZITHROMYCIN 500 MG PO TABS
500.0000 mg | ORAL_TABLET | Freq: Every day | ORAL | Status: DC
  Filled 2024-03-27: qty 1

## 2024-03-27 NOTE — Plan of Care (Signed)
 Patient remains free from any noted signs of acute distress.  To continue to be monitored by hospital staff until discharged.

## 2024-03-27 NOTE — Progress Notes (Signed)
 PROGRESS NOTE    Timothy Matthews  WUJ:[id number] DOB: [birth year] DOA: 03/25/2024 PCP: Mortimer Area, MD  Chief Complaint  Patient presents with   Shortness of Breath    Hospital Course:  Timothy Matthews is 78 year old man with history of interstitial lung disease, lung cancer, cocaine abuse, history of prior CVA, presents to the ED with shortness of breath and cough.  Vital signs in the ED mostly unremarkable.  He was given azithromycin  and ceftriaxone.  Patient is a poor historian.  Is difficult to ascertain what cancer therapy he has received.  He reports he has completed radiation but he cannot expand upon his chemotherapy, it seems he has received a few cycles but did not complete therapy..  CT scan reveals right-sided infiltrating mass into the right upper lobe bronchus.  Pulmonology was consulted.  Subjective: No acute events overnight.  Patient's friend is sleeping with him in bed on arrival. She reports that the patient is not avoiding crack and cigarettes like he is meant to. She wants to know additional ways to help me  Objective: Vitals:   03/26/24 2040 03/27/24 0329 03/27/24 0902 03/27/24 1128  BP: 121/81 127/80 128/77   Pulse: 81 78 79   Resp: 20 20 18    Temp: 99.7 F (37.6 C) 99.8 F (37.7 C) 98 F (36.7 C)   TempSrc:      SpO2: 97% 99% 97% 91%  Weight:        Intake/Output Summary (Last 24 hours) at 03/27/2024 1305 Last data filed at 03/27/2024 0500 Gross per 24 hour  Intake 350 ml  Output --  Net 350 ml   Filed Weights   03/25/24 1358  Weight: 72.6 kg    Examination: General exam: Appears calm and comfortable, NAD  Respiratory system: No work of breathing, symmetric chest wall expansion Cardiovascular system: S1 & S2 heard, RRR.  Gastrointestinal system: Abdomen is nondistended, soft and nontender.  Neuro: Alert and oriented. No focal neurological deficits. Extremities: Symmetric, expected ROM Skin: No rashes, lesions Psychiatry: Demonstrates  poor judgment and insight.  Calm affect, mood congruent  Assessment & Plan:  Principal Problem:   Community acquired pneumonia Active Problems:   CVA (cerebral vascular accident) (HCC)   Cocaine abuse with cocaine-induced mood disorder (HCC)   HTN (hypertension)   Emphysema lung (HCC)   GERD (gastroesophageal reflux disease)   Cancer of right lung (HCC)   Postobstructive pneumonia Right sided lung cancer - Patient receives much of his care from the [place].  Appears he has undergone radiation therapy but has not consistently attended chemotherapy. -- Patient reports that he is having difficulty making it to appointments due to transportation.  Would like to transfer his care to [facility] - Pulmonology is consulted, tentatively planning for bronchoscopy on Tuesday - Respiratory viral panel negative, procalcitonin less than 1, Fungitell pending, Strep pneumo antigen negative, LDH unremarkable, follow sputum culture currently without growth.  MRSA PCR negative. - Continue ceftriaxone azithromycin  for now - Appreciate further pulmonology recommendations - [facility] so [facility] - PT score: 20  Continue with as needed DuoNebs - Symptomatic support  Hypertension - Currently at goal without medications.  Will continue to monitor.  Initiate meds if needed  COPD - No wheezing appreciated on exam today.  No oxygen requirement at this time - Continue with as needed DuoNebs  Tobacco abuse - Patient reports he quit smoking 1 week ago  Polysubstance abuse -  Patient is actively smoking crack cocaine which is contributing to presentation as above.  He has been counseled on cessation   DVT prophylaxis: heparin   Code Status: Full Code Disposition: Likely bronchoscopy on Tuesday and home after  Consultants:  Treatment Team:  Consulting Physician: Annitta Kindler, MD  Procedures:    Antimicrobials:  Anti-infectives (From  admission, onward)    Start     Dose/Rate Route Frequency Ordered Stop   03/28/24 1000  azithromycin  (ZITHROMAX ) tablet 500 mg        500 mg Oral Daily 03/27/24 0751 03/31/24 0959   03/26/24 2000  azithromycin  (ZITHROMAX ) 500 mg in sodium chloride  0.9 % 250 mL IVPB  Status:  Discontinued        500 mg 250 mL/hr over 60 Minutes Intravenous Every 24 hours 03/25/24 1827 03/26/24 0724   03/26/24 1800  cefTRIAXone (ROCEPHIN) 2 g in sodium chloride  0.9 % 100 mL IVPB        2 g 200 mL/hr over 30 Minutes Intravenous Every 24 hours 03/25/24 1825 03/30/24 1759   03/26/24 1000  azithromycin  (ZITHROMAX ) 500 mg in sodium chloride  0.9 % 250 mL IVPB        500 mg 250 mL/hr over 60 Minutes Intravenous Every 24 hours 03/26/24 0724 03/27/24 1102   03/25/24 1800  cefTRIAXone (ROCEPHIN) 2 g in sodium chloride  0.9 % 100 mL IVPB        2 g 200 mL/hr over 30 Minutes Intravenous Once 03/25/24 1755 03/25/24 1901   03/25/24 1800  azithromycin  (ZITHROMAX ) 500 mg in sodium chloride  0.9 % 250 mL IVPB  Status:  Discontinued        500 mg 250 mL/hr over 60 Minutes Intravenous  Once 03/25/24 1755 03/26/24 0724       Data Reviewed: I have personally reviewed following labs and imaging studies CBC: Recent Labs  Lab 03/25/24 1359 03/26/24 0557  WBC 10.7* 12.2*  HGB 10.8* 10.4*  HCT 34.0* 32.3*  MCV 81.1 78.8*  PLT 302 290   Basic Metabolic Panel: Recent Labs  Lab 03/25/24 1359 03/26/24 0557  NA 131* 133*  K 3.9 4.1  CL 99 100  CO2 24 25  GLUCOSE 133* 102*  BUN 27* 19  CREATININE 1.09 0.95  CALCIUM  8.5* 8.6*   GFR: Estimated Creatinine Clearance: 69 mL/min (by C-G formula based on SCr of 0.95 mg/dL). Liver Function Tests: No results for input(s): AST, ALT, ALKPHOS, BILITOT, PROT, ALBUMIN in the last 168 hours. CBG: No results for input(s): GLUCAP in the last 168 hours.  Recent Results (from the past 240 hours)  Blood Culture (routine x 2)     Status: None (Preliminary result)    Collection Time: 03/25/24  6:39 PM   Specimen: BLOOD  Result Value Ref Range Status   Specimen Description BLOOD BLOOD RIGHT FOREARM  Final   Special Requests   Final    BOTTLES DRAWN AEROBIC AND ANAEROBIC Blood Culture results may not be optimal due to an inadequate volume of blood received in culture bottles   Culture   Final    NO GROWTH 2 DAYS Performed at [facility], [address] [place], [place] [address]    Report Status PENDING  Incomplete  Blood Culture (routine x 2)     Status: None (Preliminary result)   Collection Time: 03/25/24  6:39 PM   Specimen: BLOOD  Result Value Ref Range Status   Specimen Description BLOOD BLOOD RIGHT WRIST  Final   Special Requests  Final    BOTTLES DRAWN AEROBIC AND ANAEROBIC Blood Culture results may not be optimal due to an inadequate volume of blood received in culture bottles   Culture   Final    NO GROWTH 2 DAYS Performed at [facility], [address] [place], [place] [address]    Report Status PENDING  Incomplete  MRSA Next Gen by PCR, Nasal     Status: None   Collection Time: 03/26/24  4:45 PM   Specimen: Nasal Swab  Result Value Ref Range Status   MRSA by PCR Next Gen NOT DETECTED NOT DETECTED Final    Comment: (NOTE) The GeneXpert MRSA Assay (FDA approved for NASAL specimens only), is one component of a comprehensive MRSA colonization surveillance program. It is not intended to diagnose MRSA infection nor to guide or monitor treatment for MRSA infections. Test performance is not FDA approved in patients less than 23 years old. Performed at [facility], [address] [place], [place] [address]   Expectorated Sputum Assessment w Gram Stain, Rflx to Resp Cult     Status: None   Collection Time: 03/26/24  4:45 PM   Specimen: Sputum  Result Value Ref Range Status   Specimen Description SPUTUM  Final   Special Requests NONE  Final   Sputum evaluation   Final    THIS SPECIMEN IS ACCEPTABLE  FOR SPUTUM CULTURE Performed at [facility], [address] [place], [place] [address]    Report Status 03/26/2024 FINAL  Final  Culture, Respiratory w Gram Stain     Status: None (Preliminary result)   Collection Time: 03/26/24  4:45 PM   Specimen: SPU  Result Value Ref Range Status   Specimen Description   Final    SPUTUM Performed at [facility], [address] [place], [place] [address]    Special Requests   Final    NONE Reflexed from [id number] Performed at [facility], [address] [place], [place] [address]    Gram Stain   Final    RARE WBC PRESENT, PREDOMINANTLY PMN FEW GRAM POSITIVE COCCI FEW GRAM NEGATIVE RODS RARE GRAM POSITIVE RODS Performed at [facility], [address] [place], [place] [address]    Culture PENDING  Incomplete   Report Status PENDING  Incomplete  Respiratory (~20 pathogens) panel by PCR     Status: None   Collection Time: 03/26/24  6:56 PM   Specimen: Nasopharyngeal Swab; Respiratory  Result Value Ref Range Status   Adenovirus NOT DETECTED NOT DETECTED Final   Coronavirus 229E NOT DETECTED NOT DETECTED Final    Comment: (NOTE) The Coronavirus on the Respiratory Panel, DOES NOT test for the novel  Coronavirus (2019 nCoV)    Coronavirus HKU1 NOT DETECTED NOT DETECTED Final   Coronavirus NL63 NOT DETECTED NOT DETECTED Final   Coronavirus OC43 NOT DETECTED NOT DETECTED Final   Metapneumovirus NOT DETECTED NOT DETECTED Final   Rhinovirus / Enterovirus NOT DETECTED NOT DETECTED Final   Influenza A NOT DETECTED NOT DETECTED Final   Influenza B NOT DETECTED NOT DETECTED Final   Parainfluenza Virus 1 NOT DETECTED NOT DETECTED Final   Parainfluenza Virus 2 NOT DETECTED NOT DETECTED Final   Parainfluenza Virus 3 NOT DETECTED NOT DETECTED Final   Parainfluenza Virus 4 NOT DETECTED NOT DETECTED Final   Respiratory Syncytial Virus NOT DETECTED NOT DETECTED Final   Bordetella pertussis NOT DETECTED NOT DETECTED  Final   Bordetella Parapertussis NOT DETECTED NOT DETECTED  Final   Chlamydophila pneumoniae NOT DETECTED NOT DETECTED Final   Mycoplasma pneumoniae NOT DETECTED NOT DETECTED Final    Comment: Performed at [facility], [address] [place], [place] [address]     Radiology Studies: CT Angio Chest PE W and/or Wo Contrast Result Date: 03/25/2024 CLINICAL DATA:  Pulmonary embolism (PE) suspected, high prob RUL consolidation on xr, hx lung cancer. * Tracking Code: BO * EXAM: CT ANGIOGRAPHY CHEST WITH CONTRAST TECHNIQUE: Multidetector CT imaging of the chest was performed using the standard protocol during bolus administration of intravenous contrast. Multiplanar CT image reconstructions and MIPs were obtained to evaluate the vascular anatomy. RADIATION DOSE REDUCTION: This exam was performed according to the departmental dose-optimization program which includes automated exposure control, adjustment of the mA and/or kV according to patient size and/or use of iterative reconstruction technique. CONTRAST:  75mL OMNIPAQUE  IOHEXOL  350 MG/ML SOLN COMPARISON:  CT scan chest from 10/11/2023. FINDINGS: Cardiovascular: No embolism to the proximal subsegmental pulmonary artery level. Normal cardiac size. no pericardial effusion. No aortic aneurysm. Mediastinum/Nodes: Visualized thyroid  gland appears grossly unremarkable. There is infiltrating soft tissue predominantly centered around the right central hilum. Which has increased since the prior study. There is interval decrease in the previously seen precarinal and right paratracheal lymph nodes. Subcarinal lymph node also appears decreased in size since the prior study. No axillary or left hilar lymphadenopathy by size criteria. The esophagus is nondistended precluding optimal assessment. There is mild circumferential thickening of the lower thoracic esophagus, which is most likely seen in the settings of chronic gastroesophageal reflux disease versus  esophagitis. Lungs/Pleura: Redemonstration of complete cut off of the right upper lobe bronchus secondary to and ill-defined infiltrating mass, present since the prior study. There is severe narrowing of the segmental pulmonary artery branch in the mass (series 4, image 57). There are heterogeneous opacities and several cavitary lesions in the right upper lobe, which are new since the prior study. Redemonstration of moderate to severe peripheral/subpleural reticulations with honeycombing and patchy areas of bronchiectasis, with lower lobe predominance on the right and upper lobe predominance on the left, concerning for interstitial lung disease. There is new heterogeneous opacity in the superior segment of right lower lobe (series 6, image 63), and posterior segment of right upper lobe (series 6, image 68), which are nonspecific and may represent focal pneumonia however, attention on follow-up examination is recommended to exclude neoplastic process. Rest of the lungs are otherwise clear. No mass, consolidation, pleural effusion or pneumothorax. No suspicious lung nodule. Upper Abdomen: There is a small sliding hiatal hernia. Visualized upper abdominal viscera within normal limits. Musculoskeletal: The visualized soft tissues of the chest wall are grossly unremarkable. No suspicious osseous lesions. There are mild multilevel degenerative changes in the visualized spine. Posterior spinal fixation of T1 and T2 noted with transpedicular screws and rods. There is mild anterior wedging deformity of T9 and T11 vertebrae, similar to the prior study. IMPRESSION: 1. No embolism to the proximal subsegmental pulmonary artery level. 2. Redemonstration of complete cut off of the right upper lobe bronchus secondary to an ill-defined infiltrating mass, present since the prior study. There is interval increase in the infiltrating soft tissue in the right central hilum. 3. There is interval decrease in the size of previously seen  mediastinal lymph nodes. 4. There are new heterogeneous opacities in the right upper lobe, which may represent postoperative pneumonia. There are several new cavitary lesions in the right upper lobe, which may represent post infective pneumatoceles.  5. Redemonstration of findings suggestive of interstitial lung disease, as described above. 6. Multiple other nonacute observations, as described above. Electronically Signed   By: Beula Brunswick M.D.   On: 03/25/2024 16:33   DG Chest Port 1 View Result Date: 03/25/2024 CLINICAL DATA:  Shortness of breath EXAM: PORTABLE CHEST 1 VIEW COMPARISON:  X-ray 01/31/2024 and older.  CT scan 10/11/2023. FINDINGS: Normal cardiopericardial silhouette. No pneumothorax or effusion. Underlying diffuse interstitial changes are again seen but there is some increasing areas of opacity. This includes a more confluent larger area of opacity in the right upper lobe as well as some patchy areas in the left mid lower lung. Acute infiltrates possible. Degenerative changes of the spine. Fixation hardware along the lower cervical spine. IMPRESSION: Increasing areas of parenchymal opacity, most confluent in the right upper lobe but also patchy areas in left mid lower lung. Please correlate clinical presentation. Short follow-up versus additional CT workup as clinically appropriate. Electronically Signed   By: Adrianna Horde M.D.   On: 03/25/2024 15:05    Scheduled Meds:  [START ON 03/28/2024] azithromycin   500 mg Oral Daily   heparin  5,000 Units Subcutaneous Q8H   Continuous Infusions:  cefTRIAXone (ROCEPHIN)  IV 2 g (03/26/24 1750)     LOS: 2 days  MDM: Patient is high risk for one or more organ failure.  They necessitate ongoing hospitalization for continued IV therapies and subsequent lab monitoring. Total time spent interpreting labs and vitals, reviewing the medical record, coordinating care amongst consultants and care team members, directly assessing and discussing care with  the patient and/or family: 55 min Alianny Toelle, DO [facility]  To contact the attending physician between 7A-7P please use Epic Chat. To contact the covering physician during after hours 7P-7A, please review Amion.  03/27/2024, 1:05 PM   *This document has been created with the assistance of dictation software. Please excuse typographical errors. *

## 2024-03-27 NOTE — Plan of Care (Signed)

## 2024-03-27 NOTE — Evaluation (Signed)
 Occupational Therapy Evaluation Patient Details Name: Timothy Matthews MRN: [id number] DOB: 1946/02/13 Today's Date: 03/27/2024   History of Present Illness   presented to ER secondary to cough, SOB x2 weeks; admitted for management of CAP     Clinical Impressions Pt was seen for OT evaluation this date. PTA, pt resides in a 2nd level apartment requiring 2 flights of stairs to enter. Reports he is normally IND with ADLs and uses cane for mobility. Admits to fall history. Pt presents to acute OT demonstrating impaired ADL performance and functional mobility 2/2 weakness, mild balance deficits and low activity tolerance. Pt currently requires MOD I for bed mobility, CGA for STS from EOB to RW and for ambulation around the nurses station ~180 feet with no LOB. Condom catheter noted to leak and was replaced by NT upon return to room. UB dressing with Min A seated EOB for gown change. Set up assist to apply deodorant. Pt needs increased motivation to perform all tasks.  Pt would benefit from skilled OT services to address noted impairments and functional limitationsto maximize safety and independence while minimizing falls risk and caregiver burden. Do anticipate the need for follow up OT services upon acute hospital DC.      If plan is discharge home, recommend the following:   A little help with walking and/or transfers;A little help with bathing/dressing/bathroom;Assist for transportation;Assistance with cooking/housework     Functional Status Assessment   Patient has had a recent decline in their functional status and demonstrates the ability to make significant improvements in function in a reasonable and predictable amount of time.     Equipment Recommendations   Other (comment);BSC/3in1 (RW)     Recommendations for Other Services         Precautions/Restrictions   Precautions Precautions: Fall Restrictions Weight Bearing Restrictions Per Provider Order: No      Mobility Bed Mobility Overal bed mobility: Modified Independent                  Transfers Overall transfer level: Needs assistance Equipment used: Rolling walker (2 wheels) Transfers: Sit to/from Stand Sit to Stand: Contact guard assist, Min assist           General transfer comment: CGA for safety from EOB with cues for pushing up from bed; amb around nursing station with RW and CGA/SBA with no LOB      Balance Overall balance assessment: Needs assistance Sitting-balance support: No upper extremity supported, Feet supported Sitting balance-Leahy Scale: Good     Standing balance support: Bilateral upper extremity supported Standing balance-Leahy Scale: Fair Standing balance comment: no LOB with RW use                           ADL either performed or assessed with clinical judgement   ADL Overall ADL's : Needs assistance/impaired     Grooming: Applying deodorant;Sitting;Set up           Upper Body Dressing : Minimal assistance;Sitting Upper Body Dressing Details (indicate cue type and reason): doff/don gown                 Functional mobility during ADLs: Contact guard assist;Rolling walker (2 wheels)       Vision         Perception         Praxis         Pertinent Vitals/Pain Pain Assessment Pain Assessment: No/denies pain  Extremity/Trunk Assessment Upper Extremity Assessment Upper Extremity Assessment: Generalized weakness   Lower Extremity Assessment Lower Extremity Assessment: Generalized weakness       Communication Communication Communication: No apparent difficulties   Cognition Arousal: Alert Behavior During Therapy: Flat affect                                 Following commands: Intact       Cueing  General Comments          Exercises Other Exercises Other Exercises: Edu on role of OT in acute setting and importance of OOB activity.   Shoulder Instructions      Home  Living Family/patient expects to be discharged to:: Private residence Living Arrangements: Alone Available Help at Discharge: Family;Available PRN/intermittently Type of Home: Apartment (2nd floor apt) Home Access: Stairs to enter Entrance Stairs-Number of Steps: 2 flights Entrance Stairs-Rails: Right Home Layout: One level               Home Equipment: Cane - single point          Prior Functioning/Environment Prior Level of Function : Independent/Modified Independent             Mobility Comments: Mod indep with ADLs, household mobilization; limited community mobility.  Does endorse previous fall history, unable/unwilling to quantify.  No home O2. ADLs Comments: repors he is IND with ADLs, per dtr he will lay in bed all day if you let him    OT Problem List: Decreased strength;Decreased activity tolerance   OT Treatment/Interventions: Self-care/ADL training;Therapeutic exercise;Therapeutic activities;Energy conservation;DME and/or AE instruction;Patient/family education;Balance training      OT Goals(Current goals can be found in the care plan section)   Acute Rehab OT Goals Patient Stated Goal: return home OT Goal Formulation: With patient/family Time For Goal Achievement: 04/10/24 Potential to Achieve Goals: Fair ADL Goals Pt Will Perform Lower Body Bathing: with modified independence;sitting/lateral leans;sit to/from stand Pt Will Perform Lower Body Dressing: with modified independence;sitting/lateral leans;sit to/from stand Pt Will Transfer to Toilet: with modified independence;ambulating;regular height toilet   OT Frequency:  Min 1X/week    Co-evaluation              AM-PAC OT 6 Clicks Daily Activity     Outcome Measure Help from another person eating meals?: None Help from another person taking care of personal grooming?: None Help from another person toileting, which includes using toliet, bedpan, or urinal?: A Little Help from another  person bathing (including washing, rinsing, drying)?: A Little Help from another person to put on and taking off regular upper body clothing?: A Little Help from another person to put on and taking off regular lower body clothing?: A Little 6 Click Score: 20   End of Session Equipment Utilized During Treatment: Rolling walker (2 wheels) Nurse Communication: Mobility status  Activity Tolerance: Patient tolerated treatment well Patient left: in bed;with call bell/phone within reach;with bed alarm set;with family/visitor present  OT Visit Diagnosis: Other abnormalities of gait and mobility (R26.89)                Time: 1610-9604 OT Time Calculation (min): 19 min Charges:  OT General Charges $OT Visit: 1 Visit OT Evaluation $OT Eval Moderate Complexity: 1 Mod Amarri Michaelson, OTR/L 03/27/24, 3:25 PM  Makyra Corprew E Clyde Upshaw 03/27/2024, 3:23 PM

## 2024-03-27 NOTE — Evaluation (Signed)
 Physical Therapy Evaluation Patient Details Name: Timothy Matthews MRN: [id number] DOB: 03/12/1946 Today's Date: 03/27/2024  History of Present Illness  presented to ER secondary to cough, SOB x2 weeks; admitted for management of CAP  Clinical Impression  Patient sleeping in bed, daughter in room with patient; awakens to mod voice/light touch.  Oriented to self, location and general situation; mod/max encouragement required for participation with session (patient generally disinterested).  Denies pain.  Bilat UE/LE strength and ROM grossly symmetrical and WFL; no focal weakness appreciated. Able to complete bed mobility with mod indep; sit/stand, basic transfers and gait (200') with RW, cga/min assist.  Demonstrates forward flexed posture (does temporarily correct with verbal cuing), tends to maintain RW arms length anterior to patient. Narrowed BOS, very mild scissoring wtih head turns; no overt buckling or LOB. Mod SOB with gait efforts; sats 91% on RA after distance  Would benefit from skilled PT to address above deficits and promote optimal return to PLOF.; recommend post-acute PT follow up as indicated by interdisciplinary care team.            If plan is discharge home, recommend the following: A little help with walking and/or transfers;A little help with bathing/dressing/bathroom   Can travel by private vehicle        Equipment Recommendations Rolling walker (2 wheels)  Recommendations for Other Services       Functional Status Assessment Patient has had a recent decline in their functional status and demonstrates the ability to make significant improvements in function in a reasonable and predictable amount of time.     Precautions / Restrictions Precautions Precautions: Fall Restrictions Weight Bearing Restrictions Per Provider Order: No      Mobility  Bed Mobility Overal bed mobility: Modified Independent                  Transfers Overall transfer level:  Needs assistance Equipment used: Rolling walker (2 wheels) Transfers: Sit to/from Stand Sit to Stand: Contact guard assist, Min assist                Ambulation/Gait Ambulation/Gait assistance: Contact guard assist, Min assist Gait Distance (Feet): 200 Feet Assistive device: Rolling walker (2 wheels)         General Gait Details: forward flexed posture (does temporarily correct with verbal cuing), tends to maintain RW arms length anterior to patient.  Narrowed BOS, very mild scissoring wtih head turns; no overt buckling or LOB.  Mod SOB with gait efforts; sats 91% on RA after distance  Stairs            Wheelchair Mobility     Tilt Bed    Modified Rankin (Stroke Patients Only)       Balance Overall balance assessment: Needs assistance Sitting-balance support: No upper extremity supported, Feet supported Sitting balance-Leahy Scale: Good     Standing balance support: Bilateral upper extremity supported Standing balance-Leahy Scale: Fair                               Pertinent Vitals/Pain Pain Assessment Pain Assessment: No/denies pain    Home Living Family/patient expects to be discharged to:: Private residence Living Arrangements: Alone Available Help at Discharge: Family;Available PRN/intermittently Type of Home: Apartment (2nd floor apt) Home Access: Stairs to enter Entrance Stairs-Rails: Right Entrance Stairs-Number of Steps: 2 flights   Home Layout: One level Home Equipment: Cane - single point      Prior  Function Prior Level of Function : Independent/Modified Independent             Mobility Comments: Mod indep with ADLs, household mobilization; limited community mobility.  Does endorse previous fall history, unable/unwilling to quantify.  No home O2.       Extremity/Trunk Assessment   Upper Extremity Assessment Upper Extremity Assessment: Generalized weakness    Lower Extremity Assessment Lower Extremity  Assessment: Generalized weakness (grossly at least 4-/5 throughout; no focal weakness appreciated)       Communication        Cognition Arousal: Alert Behavior During Therapy: Flat affect   PT - Cognitive impairments: No apparent impairments                       PT - Cognition Comments: generally disinterested in therapy session; max cuing for participation         Cueing       General Comments      Exercises     Assessment/Plan    PT Assessment Patient needs continued PT services  PT Problem List Decreased activity tolerance;Decreased balance;Decreased mobility;Decreased coordination;Decreased knowledge of use of DME;Decreased safety awareness;Decreased knowledge of precautions;Cardiopulmonary status limiting activity       PT Treatment Interventions DME instruction;Gait training;Stair training;Functional mobility training;Therapeutic activities;Therapeutic exercise;Balance training;Patient/family education    PT Goals (Current goals can be found in the Care Plan section)  Acute Rehab PT Goals Patient Stated Goal: to go back to sleep PT Goal Formulation: With patient/family Time For Goal Achievement: 04/10/24 Potential to Achieve Goals: Good    Frequency Min 2X/week     Co-evaluation               AM-PAC PT 6 Clicks Mobility  Outcome Measure Help needed turning from your back to your side while in a flat bed without using bedrails?: None Help needed moving from lying on your back to sitting on the side of a flat bed without using bedrails?: None Help needed moving to and from a bed to a chair (including a wheelchair)?: A Little Help needed standing up from a chair using your arms (e.g., wheelchair or bedside chair)?: A Little Help needed to walk in hospital room?: A Little Help needed climbing 3-5 steps with a railing? : A Little 6 Click Score: 20    End of Session Equipment Utilized During Treatment: Gait belt Activity Tolerance:  Patient tolerated treatment well Patient left: in bed;with call bell/phone within reach;with chair alarm set;with family/visitor present Nurse Communication: Mobility status PT Visit Diagnosis: Muscle weakness (generalized) (M62.81);Difficulty in walking, not elsewhere classified (R26.2)    Time: 1100-1118 PT Time Calculation (min) (ACUTE ONLY): 18 min   Charges:   PT Evaluation $PT Eval Low Complexity: 1 Low   PT General Charges $$ ACUTE PT VISIT: 1 Visit         Dixon Luczak H. Bevin Bucks, PT, DPT, NCS 03/27/24, 11:34 AM [phone]

## 2024-03-28 DIAGNOSIS — J850 Gangrene and necrosis of lung: Principal | ICD-10-CM

## 2024-03-28 DIAGNOSIS — Z85118 Personal history of other malignant neoplasm of bronchus and lung: Secondary | ICD-10-CM

## 2024-03-28 LAB — URINE DRUG SCREEN, QUALITATIVE (ARMC ONLY)
Amphetamines, Ur Screen: NOT DETECTED
Barbiturates, Ur Screen: NOT DETECTED
Benzodiazepine, Ur Scrn: NOT DETECTED
Cannabinoid 50 Ng, Ur ~~LOC~~: NOT DETECTED
Cocaine Metabolite,Ur ~~LOC~~: POSITIVE — AB
MDMA (Ecstasy)Ur Screen: NOT DETECTED
Methadone Scn, Ur: NOT DETECTED
Opiate, Ur Screen: NOT DETECTED
Phencyclidine (PCP) Ur S: NOT DETECTED
Tricyclic, Ur Screen: NOT DETECTED

## 2024-03-28 MED ORDER — LEVOFLOXACIN 750 MG PO TABS: 750.0000 mg | ORAL_TABLET | Freq: Every day | ORAL | 0 refills | Status: AC

## 2024-03-28 NOTE — TOC Transition Note (Signed)
 Transition of Care [facility]) - Discharge Note   Patient Details  Name: Timothy Matthews MRN: [id number] Date of Birth: 20-Mar-1946  [facility] CM/SW Contact:  Crayton Docker, RN 03/28/2024, 4:46 PM   Clinical Narrative:     Discharge orders noted. [facility] following for home health PT/OT. DME scheduled for home delivery.   Final next level of care: Home w Home Health Services Barriers to Discharge: No Barriers Identified   Patient Goals and CMS Choice    Home health PT/OT  Discharge Placement      Home health PT/OT           DME: Rolling walker and 3 in 1 [facility]    Discharge Plan and Services Additional resources added to the After Visit Summary for    DME Agency: [facility] Date DME Agency Contacted: 03/28/24 Time DME Agency Contacted: [id number] Representative spoke with at DME Agency: [facility] HH Arranged: PT, OT HH Agency: [facility]  Social Drivers of Health (SDOH) Interventions SDOH Screenings   Food Insecurity: No Food Insecurity (03/25/2024)  Housing: Low Risk  (03/27/2024)  Transportation Needs: Unmet Transportation Needs (03/25/2024)  Utilities: Not At Risk (03/25/2024)  Social Connections: Moderately Integrated (03/25/2024)  Tobacco Use: High Risk (03/25/2024)     Readmission Risk Interventions     No data to display

## 2024-03-28 NOTE — Discharge Summary (Signed)
 Physician Discharge Summary   Patient: Timothy Matthews MRN: [id number] DOB: 07-Jul-1946  Admit date:     03/25/2024  Discharge date: 03/28/24  Discharge Physician: Roise Cleaver   PCP: Mortimer Area, MD   Recommendations at discharge:   Follow-up outpatient with pulmonology to schedule bronchoscopy and further workup.  Discharge Diagnoses: Principal Problem:   Community acquired pneumonia Active Problems:   CVA (cerebral vascular accident) (HCC)   Cocaine abuse with cocaine-induced mood disorder (HCC)   HTN (hypertension)   Emphysema lung (HCC)   GERD (gastroesophageal reflux disease)   Cancer of right lung (HCC)  Resolved Problems:   * No resolved hospital problems. *  Hospital Course: Timothy Matthews is 78 year old man with history of interstitial lung disease, lung cancer, cocaine abuse, history of prior CVA, presents to the ED with shortness of breath and cough.  Vital signs in the ED mostly unremarkable.  He was given azithromycin  and ceftriaxone.  Patient is a poor historian.  Is difficult to ascertain what cancer therapy he has received.  He reports he has completed radiation but he cannot expand upon his chemotherapy, it seems he has received a few cycles but did not complete therapy.  CT scan reveals right-sided infiltrating mass into the right upper lobe bronchus.  Pulmonology was consulted. Patient is planned for bronchoscopy on 6/16 the UDS resulted positive for cocaine.  Due to anesthesia concerns patient needs to be clear of cocaine for 7 days prior to undergoing generalized anesthesia.  Patient remains very stable on room air, labs have normalized, and he is requesting to discharge home.  I have discussed this plan with pulmonology who agrees to discharge with 7 days of additional antibiotic therapy.  Patient will need to follow-up outpatient with pulmonology for bronchoscopy and further workup. Patient worked with physical therapy while admitted and demonstrated  minimal therapy needs. The importance of absolute cessation from crack cocaine was reiterated to the patient.  Postobstructive pneumonia Right sided lung cancer - Patient receives much of his care from the [place].  Appears he has undergone radiation therapy but has not consistently attended chemotherapy. -- Patient reports that he is having difficulty making it to appointments due to transportation.  Would like to transfer his care to [facility] - Pulmonology consulted --Plan for bronchoscopy 6/16, canceled as UDS is still positive for cocaine.  Patient will need to be free of cocaine for 7 days before undergoing general anesthesia. - Respiratory viral panel negative, procalcitonin less than 1, Fungitell pending, Strep pneumo antigen negative, LDH unremarkable, follow sputum culture currently without growth.  MRSA PCR negative. - Discharge with an additional 7 days of Levaquin - Outpatient follow-up with pulmonology for bronchoscopy and further workup - [facility] so [facility] - PT score: 20   Hypertension - Currently at goal without medications. - Outpatient PCP follow-up   COPD - No wheezing appreciated on exam today.  No oxygen requirement at this time - Continue with as needed DuoNebs   Tobacco abuse - Patient reports he quit smoking 1 week ago   Polysubstance abuse - Patient is actively smoking crack cocaine which is contributing to presentation as above.  He has been counseled on cessation         Consultants: Pulmonology Procedures performed: n/a  Disposition: Home Diet recommendation:  Discharge Diet Orders (From admission, onward)     Start     Ordered   03/28/24 0000  Diet  general        03/28/24 1531           Regular diet DISCHARGE MEDICATION: Allergies as of 03/28/2024       Reactions   Trospium    Other Reaction(s): Blurring of visual image        Medication List     STOP taking these  medications    amLODipine  10 MG tablet Commonly known as: NORVASC    cetirizine 10 MG tablet Commonly known as: ZYRTEC   cholecalciferol 25 MCG (1000 UNIT) tablet Commonly known as: VITAMIN D3   DULoxetine 30 MG capsule Commonly known as: CYMBALTA   loratadine  10 MG tablet Commonly known as: CLARITIN    omeprazole 20 MG capsule Commonly known as: PRILOSEC   sildenafil 100 MG tablet Commonly known as: VIAGRA   tadalafil 20 MG tablet Commonly known as: CIALIS       TAKE these medications    acetaminophen  325 MG tablet Commonly known as: TYLENOL  Take 650 mg by mouth every 8 (eight) hours as needed for mild pain (pain score 1-3) (dental pain).   albuterol  108 (90 Base) MCG/ACT inhaler Commonly known as: VENTOLIN  HFA Inhale 2 puffs into the lungs every 6 (Matthews) hours as needed for wheezing or shortness of breath.   aspirin  EC 81 MG tablet Take 1 tablet (81 mg total) by mouth daily. Swallow whole.   atorvastatin  40 MG tablet Commonly known as: Lipitor Take 1 tablet (40 mg total) by mouth daily.   azelastine 0.1 % nasal spray Commonly known as: ASTELIN Place 2 sprays into both nostrils 2 (two) times daily as needed (runny nose/pnd).   brompheniramine-pseudoephedrine-DM 30-2-10 MG/5ML syrup Take 5 mLs by mouth 4 (four) times daily as needed.   fluticasone 50 MCG/ACT nasal spray Commonly known as: FLONASE Place 2 sprays into both nostrils at bedtime.   gabapentin  300 MG capsule Commonly known as: NEURONTIN  Take 600 mg by mouth 3 (three) times daily.   guaiFENesin 100 MG/5ML liquid Commonly known as: ROBITUSSIN Take 5 mLs by mouth 4 (four) times daily as needed for cough or to loosen phlegm. Take 1 teaspoonful by mouth four times a day as needed for cough and congestion   HYDROPHILIC EX Apply 1 Application topically daily as needed (dry skin).   levofloxacin 750 MG tablet Commonly known as: Levaquin Take 1 tablet (750 mg total) by mouth daily for 7 days.    melatonin 3 MG Tabs tablet Take 3-6 mg by mouth at bedtime as needed (sleep). Take one capsule/tablet by mouth at bedtime as needed. Okay to increase to 6mg  (2caps/tabs)   nicotine polacrilex 2 MG lozenge Commonly known as: COMMIT Take 2 mg by mouth as needed for smoking cessation. DISSOLVE 1 LOZENGE BY MOUTH AS DIRECTED FOR SMOKING CESSATION USE AT LEAST 8-10 LOZENGES EACH DAY TO START. DO NOT USE MORE THAN 20 LOZENGES IN A DAY. DO NOT EAT OR DRINK FOR 15 MINUTES BEFORE AND DURING USE   ondansetron 8 MG tablet Commonly known as: ZOFRAN Take 8 mg by mouth every 8 (eight) hours as needed for nausea or vomiting.   prochlorperazine 5 MG tablet Commonly known as: COMPAZINE Take 10 mg by mouth every 6 (Matthews) hours as needed for nausea or vomiting.   tamsulosin  0.4 MG Caps capsule Commonly known as: FLOMAX  Take 0.4 mg by mouth at bedtime.   terazosin 10 MG capsule Commonly known as: HYTRIN Take 20 mg by mouth at bedtime.   traZODone 50 MG tablet Commonly  known as: DESYREL Take 50 mg by mouth at bedtime.        Discharge Exam: Filed Weights   03/25/24 1358  Weight: 72.6 kg   Constitutional:  Normal appearance. Non toxic-appearing.  HENT: Head Normocephalic and atraumatic.  Mucous membranes are moist.  Eyes:  Extraocular intact. Conjunctivae normal. Pupils are equal, round, and reactive to light.  Cardiovascular: Rate and Rhythm: Normal rate and regular rhythm.  Pulmonary: Non labored, symmetric rise of chest wall.  Musculoskeletal:  Normal range of motion.  Skin: warm and dry. not jaundiced.  Neurological: No focal deficit present. alert. Oriented. Psychiatric: Mood and Affect congruent.   Discharge Instructions     Call MD for:  difficulty breathing, headache or visual disturbances   Complete by: As directed    Call MD for:  persistant dizziness or light-headedness   Complete by: As directed    Call MD for:  persistant nausea and vomiting   Complete by: As directed     Call MD for:  severe uncontrolled pain   Complete by: As directed    Call MD for:  temperature >100.4   Complete by: As directed    Diet general   Complete by: As directed    Discharge instructions   Complete by: As directed    Follow up with your primary care physician to discuss the medication changes during this admission. Follow up with Pulmonology as directed.  STOP doing cocaine and crack.   Increase activity slowly   Complete by: As directed    Pulmonary Visit   Complete by: As directed    Reason for referral: Lung Mass/Lung Nodule   Does the patient's CT scan have any urgent finds? ("lung mass" (3+cm), suspected "metastatic disease," nodules >8mm with high-risk characteristics such as "spiculation, pleural tenting, adenopathy," or non-calcified nodule >63mm): Yes      Condition at discharge: stable  The results of significant diagnostics from this hospitalization (including imaging, microbiology, ancillary and laboratory) are listed below for reference.   Imaging Studies: CT Angio Chest PE W and/or Wo Contrast Result Date: 03/25/2024 CLINICAL DATA:  Pulmonary embolism (PE) suspected, high prob RUL consolidation on xr, hx lung cancer. * Tracking Code: BO * EXAM: CT ANGIOGRAPHY CHEST WITH CONTRAST TECHNIQUE: Multidetector CT imaging of the chest was performed using the standard protocol during bolus administration of intravenous contrast. Multiplanar CT image reconstructions and MIPs were obtained to evaluate the vascular anatomy. RADIATION DOSE REDUCTION: This exam was performed according to the departmental dose-optimization program which includes automated exposure control, adjustment of the mA and/or kV according to patient size and/or use of iterative reconstruction technique. CONTRAST:  75mL OMNIPAQUE  IOHEXOL  350 MG/ML SOLN COMPARISON:  CT scan chest from 10/11/2023. FINDINGS: Cardiovascular: No embolism to the proximal subsegmental pulmonary artery level. Normal cardiac size. no  pericardial effusion. No aortic aneurysm. Mediastinum/Nodes: Visualized thyroid  gland appears grossly unremarkable. There is infiltrating soft tissue predominantly centered around the right central hilum. Which has increased since the prior study. There is interval decrease in the previously seen precarinal and right paratracheal lymph nodes. Subcarinal lymph node also appears decreased in size since the prior study. No axillary or left hilar lymphadenopathy by size criteria. The esophagus is nondistended precluding optimal assessment. There is mild circumferential thickening of the lower thoracic esophagus, which is most likely seen in the settings of chronic gastroesophageal reflux disease versus esophagitis. Lungs/Pleura: Redemonstration of complete cut off of the right upper lobe bronchus secondary to and ill-defined infiltrating mass, present  since the prior study. There is severe narrowing of the segmental pulmonary artery branch in the mass (series 4, image 57). There are heterogeneous opacities and several cavitary lesions in the right upper lobe, which are new since the prior study. Redemonstration of moderate to severe peripheral/subpleural reticulations with honeycombing and patchy areas of bronchiectasis, with lower lobe predominance on the right and upper lobe predominance on the left, concerning for interstitial lung disease. There is new heterogeneous opacity in the superior segment of right lower lobe (series 6, image 63), and posterior segment of right upper lobe (series 6, image 68), which are nonspecific and may represent focal pneumonia however, attention on follow-up examination is recommended to exclude neoplastic process. Rest of the lungs are otherwise clear. No mass, consolidation, pleural effusion or pneumothorax. No suspicious lung nodule. Upper Abdomen: There is a small sliding hiatal hernia. Visualized upper abdominal viscera within normal limits. Musculoskeletal: The visualized soft  tissues of the chest wall are grossly unremarkable. No suspicious osseous lesions. There are mild multilevel degenerative changes in the visualized spine. Posterior spinal fixation of T1 and T2 noted with transpedicular screws and rods. There is mild anterior wedging deformity of T9 and T11 vertebrae, similar to the prior study. IMPRESSION: 1. No embolism to the proximal subsegmental pulmonary artery level. 2. Redemonstration of complete cut off of the right upper lobe bronchus secondary to an ill-defined infiltrating mass, present since the prior study. There is interval increase in the infiltrating soft tissue in the right central hilum. 3. There is interval decrease in the size of previously seen mediastinal lymph nodes. 4. There are new heterogeneous opacities in the right upper lobe, which may represent postoperative pneumonia. There are several new cavitary lesions in the right upper lobe, which may represent post infective pneumatoceles. 5. Redemonstration of findings suggestive of interstitial lung disease, as described above. 6. Multiple other nonacute observations, as described above. Electronically Signed   By: Beula Brunswick M.D.   On: 03/25/2024 16:33   DG Chest Port 1 View Result Date: 03/25/2024 CLINICAL DATA:  Shortness of breath EXAM: PORTABLE CHEST 1 VIEW COMPARISON:  X-ray 01/31/2024 and older.  CT scan 10/11/2023. FINDINGS: Normal cardiopericardial silhouette. No pneumothorax or effusion. Underlying diffuse interstitial changes are again seen but there is some increasing areas of opacity. This includes a more confluent larger area of opacity in the right upper lobe as well as some patchy areas in the left mid lower lung. Acute infiltrates possible. Degenerative changes of the spine. Fixation hardware along the lower cervical spine. IMPRESSION: Increasing areas of parenchymal opacity, most confluent in the right upper lobe but also patchy areas in left mid lower lung. Please correlate clinical  presentation. Short follow-up versus additional CT workup as clinically appropriate. Electronically Signed   By: Adrianna Horde M.D.   On: 03/25/2024 15:05    Microbiology: Results for orders placed or performed during the hospital encounter of 03/25/24  Blood Culture (routine x 2)     Status: None (Preliminary result)   Collection Time: 03/25/24  6:39 PM   Specimen: BLOOD  Result Value Ref Range Status   Specimen Description BLOOD BLOOD RIGHT FOREARM  Final   Special Requests   Final    BOTTLES DRAWN AEROBIC AND ANAEROBIC Blood Culture results may not be optimal due to an inadequate volume of blood received in culture bottles   Culture   Final    NO GROWTH 3 DAYS Performed at [facility], [address], [place], [place]  [address]    Report Status PENDING  Incomplete  Blood Culture (routine x 2)     Status: None (Preliminary result)   Collection Time: 03/25/24  6:39 PM   Specimen: BLOOD  Result Value Ref Range Status   Specimen Description BLOOD BLOOD RIGHT WRIST  Final   Special Requests   Final    BOTTLES DRAWN AEROBIC AND ANAEROBIC Blood Culture results may not be optimal due to an inadequate volume of blood received in culture bottles   Culture   Final    NO GROWTH 3 DAYS Performed at [facility], [address], [place], [place] [address]    Report Status PENDING  Incomplete  MRSA Next Gen by PCR, Nasal     Status: None   Collection Time: 03/26/24  4:45 PM   Specimen: Nasal Swab  Result Value Ref Range Status   MRSA by PCR Next Gen NOT DETECTED NOT DETECTED Final    Comment: (NOTE) The GeneXpert MRSA Assay (FDA approved for NASAL specimens only), is one component of a comprehensive MRSA colonization surveillance program. It is not intended to diagnose MRSA infection nor to guide or monitor treatment for MRSA infections. Test performance is not FDA approved in patients less than 32 years old. Performed at [facility], [address]  [place], [place] [address]   Expectorated Sputum Assessment w Gram Stain, Rflx to Resp Cult     Status: None   Collection Time: 03/26/24  4:45 PM   Specimen: Sputum  Result Value Ref Range Status   Specimen Description SPUTUM  Final   Special Requests NONE  Final   Sputum evaluation   Final    THIS SPECIMEN IS ACCEPTABLE FOR SPUTUM CULTURE Performed at [facility], [address], [place], [place] [address]    Report Status 03/26/2024 FINAL  Final  Culture, Respiratory w Gram Stain     Status: None (Preliminary result)   Collection Time: 03/26/24  4:45 PM   Specimen: SPU  Result Value Ref Range Status   Specimen Description   Final    SPUTUM Performed at [facility], [address] [place], [place] [address]    Special Requests   Final    NONE Reflexed from [id number] Performed at [facility], [address] [place], [place] [address]    Gram Stain   Final    RARE WBC PRESENT, PREDOMINANTLY PMN FEW GRAM POSITIVE COCCI FEW GRAM NEGATIVE RODS RARE GRAM POSITIVE RODS    Culture   Final    CULTURE REINCUBATED FOR BETTER GROWTH Performed at [facility], [address] [place], [place] [address]    Report Status PENDING  Incomplete  Respiratory (~20 pathogens) panel by PCR     Status: None   Collection Time: 03/26/24  6:56 PM   Specimen: Nasopharyngeal Swab; Respiratory  Result Value Ref Range Status   Adenovirus NOT DETECTED NOT DETECTED Final   Coronavirus 229E NOT DETECTED NOT DETECTED Final    Comment: (NOTE) The Coronavirus on the Respiratory Panel, DOES NOT test for the novel  Coronavirus (2019 nCoV)    Coronavirus HKU1 NOT DETECTED NOT DETECTED Final   Coronavirus NL63 NOT DETECTED NOT DETECTED Final   Coronavirus OC43 NOT DETECTED NOT DETECTED Final   Metapneumovirus NOT DETECTED NOT DETECTED Final   Rhinovirus / Enterovirus NOT DETECTED NOT DETECTED Final   Influenza A NOT DETECTED NOT DETECTED Final   Influenza B NOT DETECTED  NOT DETECTED Final  Parainfluenza Virus 1 NOT DETECTED NOT DETECTED Final   Parainfluenza Virus 2 NOT DETECTED NOT DETECTED Final   Parainfluenza Virus 3 NOT DETECTED NOT DETECTED Final   Parainfluenza Virus 4 NOT DETECTED NOT DETECTED Final   Respiratory Syncytial Virus NOT DETECTED NOT DETECTED Final   Bordetella pertussis NOT DETECTED NOT DETECTED Final   Bordetella Parapertussis NOT DETECTED NOT DETECTED Final   Chlamydophila pneumoniae NOT DETECTED NOT DETECTED Final   Mycoplasma pneumoniae NOT DETECTED NOT DETECTED Final    Comment: Performed at [facility], [address], [place], [place] [address]    Labs: CBC: Recent Labs  Lab 03/25/24 1359 03/26/24 0557  WBC 10.7* 12.2*  HGB 10.8* 10.4*  HCT 34.0* 32.3*  MCV 81.1 78.8*  PLT 302 290   Basic Metabolic Panel: Recent Labs  Lab 03/25/24 1359 03/26/24 0557  NA 131* 133*  K 3.9 4.1  CL 99 100  CO2 24 25  GLUCOSE 133* 102*  BUN 27* 19  CREATININE 1.09 0.95  CALCIUM  8.5* 8.6*   Liver Function Tests: No results for input(s): AST, ALT, ALKPHOS, BILITOT, PROT, ALBUMIN in the last 168 hours. CBG: No results for input(s): GLUCAP in the last 168 hours.  Discharge time spent: 32 minutes.  Signed: Pookela Sellin, DO [facility] 03/28/2024

## 2024-03-28 NOTE — Plan of Care (Signed)
   Problem: Education: Goal: Knowledge of General Education information will improve Description: Including pain rating scale, medication(s)/side effects and non-pharmacologic comfort measures Outcome: Progressing   Problem: Activity: Goal: Risk for activity intolerance will decrease Outcome: Progressing

## 2024-03-28 NOTE — Progress Notes (Signed)
 NAME:  Timothy Matthews, MRN:  [id number], DOB:  04/22/1946, LOS: 3 ADMISSION DATE:  03/25/2024   History of Present Illness:  Timothy Matthews is a 78 year old male patient with a past medical history of tobacco use, polysubstance use specifically smoking crack, recent diagnosis of right upper lobe lung cancer being managed at the [place] no documentation on file presenting to [facility] on 06/13 with ongoing shortness of breath and cough with phlegm for months.  Pulmonary team is being consulted for help with further evaluation.  He reports that for months he has been coughing up phlegm yellowish mostly.  He also describes worsening shortness of breath mostly on exertion but now at rest.  This has worsened for the past 2 weeks.  Unfortunately Timothy Matthews continues to smoke tobacco and inhaled crack.  He also reports that he he is being managed for lung cancer at the [place] and his last radiation therapy was in March 2025 and his last chemotherapy was at the same time and only received 3 cycles.  He states that he received all of his cancer treatment at the [facility].  He has not received a follow-up appointment per his admission.  Labs: Have been reviewed.  Sputum culture has been read incubated.  There are gram-negative rods and gram-positive cocci noted.  CTA chest on 03/25/2024 did not show any pulmonary embolism.  But did show right upper lobe cavitary lesion with increased consolidative opacification, also demonstrate interlobular septal thickening specifically in the lower lobe with reticular opacities in the right lower lobe.  There is also chronic left upper lobe honeycombing anteriorly distributed.  Review of the right upper lobe process appears to be a necrotizing pneumonia.  He does have a CT chest in December 2024 I think that was at the time of diagnosis of his cancer showed Right upper lobe complete opacification with cut off sign in the right upper lobe bronchs. Fibrotic changes with honeycombing and  reticular opacities in the left upper lobe and right lower lobe were redemonstrated.   Pertinent  Medical History  As above  Significant Hospital Events: Including procedures, antibiotic start and stop dates in addition to other pertinent events   03/26/2024 - Patient on room air.   Objective    Blood pressure 123/75, pulse 74, temperature 98 F (36.7 C), temperature source Oral, resp. rate 17, weight 72.6 kg, SpO2 95%.        Intake/Output Summary (Last 24 hours) at 03/28/2024 1641 Last data filed at 03/28/2024 1500 Gross per 24 hour  Intake 340 ml  Output 60 ml  Net 280 ml   Filed Weights   03/25/24 1358  Weight: 72.6 kg   SpO2: 95 %  Examination: GENERAL: Debilitated gentleman, laying in bed, no respiratory distress, mild conversational dyspnea. HEAD: Normocephalic, atraumatic.  EYES: Pupils equal, round, reactive to light.  No scleral icterus.  MOUTH: Poor dentition, missing teeth.   NECK: Supple. No thyromegaly. Trachea midline. No JVD.  No adenopathy. PULMONARY: Good air entry bilaterally.  Amphoric breath sounds on the right upper lobe.  Crackles at bases (dry). CARDIOVASCULAR: S1 and S2. Regular rate and rhythm.  No rubs, murmurs or gallops heard. ABDOMEN: Nondistended, otherwise benign. MUSCULOSKELETAL: No joint deformity, no clubbing, no edema.  NEUROLOGIC: No overt focal deficit. SKIN: Intact,warm,dry. PSYCH: Flat affect, passive mood.  Labs and imaging were reviewed in exhaustive detail.   Assessment and Plan  Right upper lobe necrotizing pneumonia Continue antibiotics Consider fluoroquinolone to cover for potential Pseudomonas will  need 10 to 14 days therapy Follow-up sputum culture No bronchoscopy today due to issues below If discharge contemplated will set up follow-up at pulmonary clinic with Dr. 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  Will address on follow-up at pulmonary clinic  History of non-small cell lung  cancer Status post chemo/radiation Cavitary changes may be related to radiation Cavitary changes may be aggravated due to necrotizing pneumonia Needs follow-up with oncology at [facility]  Cocaine abuse UDS positive for cocaine This precluded proceeding with potential bronchoscopy Patient counseled regards to abstaining from cocaine Reassess for need of bronchoscopy upon follow-up at pulmonary clinic     I spent 45 minutes of dedicated to the care of this patient on the date of this encounter to include pre-visit review of records, face-to-face time with the patient discussing conditions above, post visit ordering of testing, clinical documentation with the electronic health record, making appropriate referrals as documented, and communicating necessary findings to members of the patients care team.    C. Chloe Counter, MD Advanced Bronchoscopy PCCM [facility]   *This note was generated using voice recognition software/Dragon and/or AI transcription program.  Despite best efforts to proofread, errors can occur which can change the meaning. Any transcriptional errors that result from this process are unintentional and may not be fully corrected at the time of dictation.

## 2024-03-28 NOTE — TOC Initial Note (Signed)
 Transition of Care [facility]) - Initial/Assessment Note    Patient Details  Name: Timothy Matthews MRN: [id number] Date of Birth: Jun 13, 1946  Transition of Care [facility]) CM/SW Contact:    Crayton Docker, RN Phone Number: 03/28/2024, 4:41 PM  Clinical Narrative:                 CM to patient's room regarding TOC screening assessment. CM explained case management role and discharge care planning process. Patient verbalized understanding and agreement. Patient states lives alone and uses cane. CM and patient discussed home health referral and discharge care planning. Per patient no preferences and states prefers to go through [place] for home health. CM call to [facility] for home health PT/OT. Per [facility], agency has accepted patient for home health. CM secure message to [employer] for rolling walker and 3 in 1 [facility] for home delivery. CM call to patient's daughter, Sherrlyn Dolores, phone: [phone]  regarding [facility] and DME for home delivery. No answer, CM left message for return call.   Expected Discharge Plan: Home w Home Health Services Barriers to Discharge: No Barriers Identified   Patient Goals and CMS Choice    Home with home health PT/OT     Expected Discharge Plan and Services    Home with home health PT/OT   Living arrangements for the past 2 months: Single Family Home Expected Discharge Date: 03/28/24                 DME Agency: [facility] Date DME Agency Contacted: 03/28/24 Time DME Agency Contacted: 1640 Representative spoke with at DME Agency: Royston Cornea HH Arranged: PT, OT HH Agency: [facility]    Prior Living Arrangements/Services Living arrangements for the past 2 months: Single Family Home Lives with:: Self Patient language and need for interpreter reviewed:: No Do you feel safe going back to the place where you live?: Yes      Need for Family Participation in Patient Care: Yes (Comment) Care giver support system in place?:  Yes (comment) Current home services: DME [facility]) Criminal Activity/Legal Involvement Pertinent to Current Situation/Hospitalization: No - Comment as needed  Activities of Daily Living   ADL Screening (condition at time of admission) Independently performs ADLs?: No Does the patient have a NEW difficulty with bathing/dressing/toileting/self-feeding that is expected to last >3 days?: Yes (Initiates electronic notice to provider for possible OT consult) Does the patient have a NEW difficulty with getting in/out of bed, walking, or climbing stairs that is expected to last >3 days?: Yes (Initiates electronic notice to provider for possible PT consult) Does the patient have a NEW difficulty with communication that is expected to last >3 days?: No Is the patient deaf or have difficulty hearing?: No Does the patient have difficulty seeing, even when wearing glasses/contacts?: No Does the patient have difficulty concentrating, remembering, or making decisions?: No  Permission Sought/Granted      Share Information with NAME: Roxianne Coral     Permission granted to share info w Relationship: sister  Permission granted to share info w Contact Information: yes  Emotional Assessment Appearance:: Appears stated age Attitude/Demeanor/Rapport: Engaged Affect (typically observed): Calm Orientation: : Oriented to Self, Oriented to Place, Oriented to  Time      Admission diagnosis:  Community acquired pneumonia [J18.9] Pneumonia of right upper lobe due to infectious organism [J18.9] Mass of upper lobe of right lung [R91.8] Patient Active Problem List   Diagnosis Date Noted   Cancer of right  lung (HCC) 03/25/2024   Community acquired pneumonia 03/25/2024   Cocaine abuse with cocaine-induced mood disorder (HCC) 09/04/2022   Anxiety state 09/02/2022   Medication reaction 09/02/2022   CVA (cerebral vascular accident) (HCC) 10/23/2020   Cocaine abuse (HCC) 10/23/2020   HTN (hypertension) 10/23/2020    Emphysema lung (HCC) 10/23/2020   GERD (gastroesophageal reflux disease) 10/23/2020   PCP:  Mortimer Area, MD Pharmacy:   [facility] [place], [place] - [address] [place] [address] Phone: [phone] Fax: [phone]  [facility] [address]), [place] - [address] [place]) [place] [address] Phone: [phone] Fax: [phone]     Social Drivers of Health (SDOH) Social History: SDOH Screenings   Food Insecurity: No Food Insecurity (03/25/2024)  Housing: Low Risk  (03/27/2024)  Transportation Needs: Unmet Transportation Needs (03/25/2024)  Utilities: Not At Risk (03/25/2024)  Social Connections: Moderately Integrated (03/25/2024)  Tobacco Use: High Risk (03/25/2024)   SDOH Interventions:     Readmission Risk Interventions     No data to display

## 2024-03-28 NOTE — Plan of Care (Signed)
 Patient remains free from any noted signs of acute distress.  Caregiver/friend remains present in room throughout current night shift.  No additional medical interventions required at this time.  Patient to continue to be monitored by hospital staff until discharged.

## 2024-03-28 NOTE — Progress Notes (Signed)
Patient being discharged home.  Patient to be transported by his daughter.  IV removed with the catheter intact.  Discharge instructions and prescription information given to the patient who verbalized understanding.   °

## 2024-03-28 NOTE — Progress Notes (Signed)
 Mobility Specialist - Progress Note   03/28/24 1211  Mobility  Activity Ambulated with assistance in hallway;Ambulated with assistance to bathroom  Level of Assistance Contact guard assist, steadying assist  Assistive Device Front wheel walker  Distance Ambulated (ft) 160 ft  Activity Response Tolerated well  Mobility visit 1 Mobility  Mobility Specialist Start Time (ACUTE ONLY) 1128  Mobility Specialist Stop Time (ACUTE ONLY) 1137  Mobility Specialist Time Calculation (min) (ACUTE ONLY) 9 min   Pt supine upon entry, utilizing RA. Pt agreeable to OOB amb this date. Pt completed bed mob ModI, STS to RW and amb one lap around the NS CGA--- endorsed fatigue and SOB upon return to the room. Pt O2 87% upon return to the room, quickly recovering to 90% after rest. Upon return to the room Pt impulsively sits RW to the side and amb to the bathroom no AD--- one LOB, MinA for correction. Pt returned to bed, left supine with alarm set and needs within reach.  Versa Gore Mobility Specialist 03/28/24 12:26 PM

## 2024-03-29 LAB — CULTURE, RESPIRATORY W GRAM STAIN: Culture: NORMAL

## 2024-03-29 LAB — LEGIONELLA PNEUMOPHILA SEROGP 1 UR AG: L. pneumophila Serogp 1 Ur Ag: NEGATIVE

## 2024-03-30 LAB — FUNGITELL BETA-D-GLUCAN
Fungitell Value:: <31.25 pg/mL
Result Name:: NEGATIVE

## 2024-03-30 LAB — CULTURE, BLOOD (ROUTINE X 2)
Culture: NO GROWTH
Culture: NO GROWTH

## 2024-04-18 ENCOUNTER — Other Ambulatory Visit: Payer: Self-pay

## 2024-04-18 DIAGNOSIS — J441 Chronic obstructive pulmonary disease with (acute) exacerbation: Secondary | ICD-10-CM | POA: Diagnosis not present

## 2024-04-18 DIAGNOSIS — I10 Essential (primary) hypertension: Secondary | ICD-10-CM | POA: Diagnosis not present

## 2024-04-18 DIAGNOSIS — Z8546 Personal history of malignant neoplasm of prostate: Secondary | ICD-10-CM | POA: Insufficient documentation

## 2024-04-18 DIAGNOSIS — Z85118 Personal history of other malignant neoplasm of bronchus and lung: Secondary | ICD-10-CM | POA: Diagnosis not present

## 2024-04-18 DIAGNOSIS — D72829 Elevated white blood cell count, unspecified: Secondary | ICD-10-CM | POA: Insufficient documentation

## 2024-04-18 DIAGNOSIS — R0602 Shortness of breath: Secondary | ICD-10-CM | POA: Diagnosis present

## 2024-04-18 NOTE — ED Triage Notes (Signed)
 Pt to ED via POV c/o SOB. States he is always short of breath but tonight it got a little worse. Hx lung cancer and was recently admitted for pneumonia. Denies CP

## 2024-04-19 ENCOUNTER — Emergency Department

## 2024-04-19 ENCOUNTER — Other Ambulatory Visit: Payer: Self-pay

## 2024-04-19 ENCOUNTER — Emergency Department
Admission: EM | Admit: 2024-04-19 | Discharge: 2024-04-19 | Disposition: A | Discharge status: Home / Self Care | Attending: Emergency Medicine | Admitting: Emergency Medicine

## 2024-04-19 ENCOUNTER — Observation Stay
Admission: EM | Admit: 2024-04-19 | Discharge: 2024-04-20 | Disposition: A | Discharge status: Home / Self Care | Attending: Family Medicine | Admitting: Family Medicine

## 2024-04-19 ENCOUNTER — Encounter: Payer: Self-pay | Admitting: Emergency Medicine

## 2024-04-19 DIAGNOSIS — F17210 Nicotine dependence, cigarettes, uncomplicated: Secondary | ICD-10-CM | POA: Insufficient documentation

## 2024-04-19 DIAGNOSIS — F1910 Other psychoactive substance abuse, uncomplicated: Secondary | ICD-10-CM | POA: Diagnosis not present

## 2024-04-19 DIAGNOSIS — Z7982 Long term (current) use of aspirin: Secondary | ICD-10-CM | POA: Insufficient documentation

## 2024-04-19 DIAGNOSIS — Z79899 Other long term (current) drug therapy: Secondary | ICD-10-CM | POA: Insufficient documentation

## 2024-04-19 DIAGNOSIS — I10 Essential (primary) hypertension: Secondary | ICD-10-CM | POA: Diagnosis not present

## 2024-04-19 DIAGNOSIS — J441 Chronic obstructive pulmonary disease with (acute) exacerbation: Principal | ICD-10-CM

## 2024-04-19 DIAGNOSIS — R0602 Shortness of breath: Principal | ICD-10-CM

## 2024-04-19 DIAGNOSIS — Z8546 Personal history of malignant neoplasm of prostate: Secondary | ICD-10-CM | POA: Insufficient documentation

## 2024-04-19 DIAGNOSIS — Z8673 Personal history of transient ischemic attack (TIA), and cerebral infarction without residual deficits: Secondary | ICD-10-CM | POA: Insufficient documentation

## 2024-04-19 DIAGNOSIS — Z85118 Personal history of other malignant neoplasm of bronchus and lung: Secondary | ICD-10-CM | POA: Diagnosis not present

## 2024-04-19 DIAGNOSIS — C3490 Malignant neoplasm of unspecified part of unspecified bronchus or lung: Secondary | ICD-10-CM

## 2024-04-19 DIAGNOSIS — F1410 Cocaine abuse, uncomplicated: Secondary | ICD-10-CM | POA: Insufficient documentation

## 2024-04-19 DIAGNOSIS — R052 Subacute cough: Secondary | ICD-10-CM

## 2024-04-19 LAB — CBC
HCT: 31.8 % — ABNORMAL LOW (ref 39.0–52.0)
Hemoglobin: 10.2 g/dL — ABNORMAL LOW (ref 13.0–17.0)
MCH: 26 pg (ref 26.0–34.0)
MCHC: 32.1 g/dL (ref 30.0–36.0)
MCV: 81.1 fL (ref 80.0–100.0)
Platelets: 232 K/uL (ref 150–400)
RBC: 3.92 MIL/uL — ABNORMAL LOW (ref 4.22–5.81)
RDW: 21 % — ABNORMAL HIGH (ref 11.5–15.5)
WBC: 17.8 K/uL — ABNORMAL HIGH (ref 4.0–10.5)
nRBC: 0 % (ref 0.0–0.2)

## 2024-04-19 LAB — BASIC METABOLIC PANEL WITH GFR
Anion gap: 10 (ref 5–15)
BUN: 26 mg/dL — ABNORMAL HIGH (ref 8–23)
CO2: 23 mmol/L (ref 22–32)
Calcium: 8.1 mg/dL — ABNORMAL LOW (ref 8.9–10.3)
Chloride: 104 mmol/L (ref 98–111)
Creatinine, Ser: 0.75 mg/dL (ref 0.61–1.24)
GFR, Estimated: >60 mL/min (ref >60)
Glucose, Bld: 105 mg/dL — ABNORMAL HIGH (ref 70–99)
Potassium: 3.7 mmol/L (ref 3.5–5.1)
Sodium: 137 mmol/L (ref 135–145)

## 2024-04-19 MED ORDER — GUAIFENESIN ER 600 MG PO TB12: 600.0000 mg | ORAL_TABLET | Freq: Two times a day (BID) | ORAL | 0 refills | Status: AC

## 2024-04-19 MED ORDER — PREDNISONE 20 MG PO TABS
50.0000 mg | ORAL_TABLET | Freq: Once | ORAL | Status: AC
  Administered 2024-04-19: 50 mg via ORAL
  Filled 2024-04-19: qty 1

## 2024-04-19 MED ORDER — IPRATROPIUM-ALBUTEROL 0.5-2.5 (3) MG/3ML IN SOLN
3.0000 mL | Freq: Once | RESPIRATORY_TRACT | Status: AC
  Administered 2024-04-19: 3 mL via RESPIRATORY_TRACT
  Filled 2024-04-19: qty 3

## 2024-04-19 MED ORDER — ALBUTEROL SULFATE HFA 108 (90 BASE) MCG/ACT IN AERS: 2.0000 | INHALATION_SPRAY | RESPIRATORY_TRACT | 2 refills | Status: DC | PRN

## 2024-04-19 MED ORDER — ALBUTEROL SULFATE (2.5 MG/3ML) 0.083% IN NEBU
5.0000 mg | INHALATION_SOLUTION | Freq: Once | RESPIRATORY_TRACT | Status: AC
  Administered 2024-04-19: 5 mg via RESPIRATORY_TRACT
  Filled 2024-04-19: qty 6

## 2024-04-19 MED ORDER — BUDESONIDE-FORMOTEROL FUMARATE 80-4.5 MCG/ACT IN AERO: 2.0000 | INHALATION_SPRAY | Freq: Two times a day (BID) | RESPIRATORY_TRACT | 3 refills | Status: DC

## 2024-04-19 MED ORDER — AEROCHAMBER PLUS FLO-VU MISC: 2 refills | Status: DC

## 2024-04-19 NOTE — ED Provider Notes (Signed)
 [facility] Provider Note    Event Date/Time   First MD Initiated Contact with Patient 04/19/24 0319     (approximate)   History   Chief Complaint: Shortness of Breath   HPI  Timothy Matthews is a 78 y.o. male with a history of lung cancer status post radiation, cocaine abuse, emphysema, GERD, hypertension, prostate cancer who comes ED complaining of shortness of breath for the past week, gradually worsening ever since being discharged from [facility] where he was treated for respiratory illness.  Also was recently admitted to [facility] regional prior to that, treated for pneumonia.  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  He has been compliant with oral prednisone  that was prescribed by the [place], but has not been using albuterol  due to uncertainty about how to use it.        Past Medical History:  Diagnosis Date   Cancer (HCC)    Cocaine abuse (HCC)    last ED encounter 04/07/20   Emphysema lung (HCC)    pan-lobar by CT 2017   GERD (gastroesophageal reflux disease)    Hepatitis C    Hypertension    Prostate cancer [facility]) BPH   PTSD (post-traumatic stress disorder)     Current Outpatient Rx   Order #: [id number] Class: Normal   Order #: [id number] Class: Normal   Order #: [id number] Class: Normal   Order #: [id number] Class: Normal   Order #: [id number] Class: Historical Med   Order #: [id number] Class: Normal   Order #: [id number] Class: Normal   Order #: [id number] Class: Normal   Order #: [id number] Class: Historical Med   Order #: [id number] Class: Normal   Order #: [id number] Class: Historical Med   Order #: [id number] Class: Historical Med   Order #: [id number] Class: Historical Med   Order #: [id number] Class: Historical Med   Order #: [id number] Class: Historical Med   Order #: [id number] Class:  Historical Med   Order #: [id number] Class: Historical Med   Order #: [id number] Class: Historical Med   Order #: [id number] Class: Historical Med   Order #: [id number] Class: Historical Med   Order #: [id number] Class: Historical Med    Past Surgical History:  Procedure Laterality Date   CERVICAL SPINE SURGERY  2017   Done at [facility] SPINE SURGERY     TEE WITHOUT CARDIOVERSION N/A 10/26/2020   Procedure: TRANSESOPHAGEAL ECHOCARDIOGRAM (TEE);  Surgeon: Bosie Vinie LABOR, MD;  Location: [facility] ORS;  Service: Cardiovascular;  Laterality: N/A;   TONSILLECTOMY     TRANSURETHRAL RESECTION OF PROSTATE      Physical Exam   Triage Vital Signs: ED Triage Vitals  Encounter Vitals Group     BP 04/18/24 2343 (!) 130/59     Girls Systolic BP Percentile --      Girls Diastolic BP Percentile --      Boys Systolic BP Percentile --      Boys Diastolic BP Percentile --      Pulse Rate 04/18/24 2343 78     Resp 04/18/24 2343 20     Temp 04/18/24 2343 98.3 F (36.8 C)     Temp Source 04/18/24 2343 Oral     SpO2 04/18/24 2343 94 %     Weight --      Height --      Head Circumference --      Peak Flow --  Pain Score 04/18/24 2351 0     Pain Loc --      Pain Education --      Exclude from Growth Chart --     Most recent vital signs: Vitals:   04/19/24 0400 04/19/24 0412  BP: (!) 149/98   Pulse: 78   Resp: 19   Temp:  98.4 F (36.9 C)  SpO2: 93%     General: Awake, no distress.  CV:  Good peripheral perfusion.  Regular rate rhythm Resp:  Normal effort.  Diffuse expiratory wheezing, mildly prolonged expiratory phase Abd:  No distention.  Soft nontender Other:  No lower extremity edema.   ED Results / Procedures / Treatments   Labs (all labs ordered are listed, but only abnormal results are displayed) Labs Reviewed  BASIC METABOLIC PANEL WITH GFR - Abnormal; Notable for the following components:      Result Value   Glucose, Bld 105 (*)    BUN 26 (*)    Calcium  8.1 (*)    All  other components within normal limits  CBC - Abnormal; Notable for the following components:   WBC 17.8 (*)    RBC 3.92 (*)    Hemoglobin 10.2 (*)    HCT 31.8 (*)    RDW 21.0 (*)    All other components within normal limits     EKG Interpreted by me Sinus rhythm rate of 83.  Left axis, normal intervals.  Normal QRS ST segments T waves   RADIOLOGY Chest x-ray interpreted by me, shows persistent opacities in the lung . Radiology report reviewed   PROCEDURES:  Procedures   MEDICATIONS ORDERED IN ED: Medications  predniSONE  (DELTASONE ) tablet 50 mg (50 mg Oral Given 04/19/24 0358)  ipratropium-albuterol  (DUONEB) 0.5-2.5 (3) MG/3ML nebulizer solution 3 mL (3 mLs Nebulization Given 04/19/24 0359)  albuterol  (PROVENTIL ) (2.5 MG/3ML) 0.083% nebulizer solution 5 mg (5 mg Nebulization Given 04/19/24 0359)     IMPRESSION / MDM / ASSESSMENT AND PLAN / ED COURSE  I reviewed the triage vital signs and the nursing notes.  DDx: Pneumonia, pleural effusion, pulmonary edema, COPD exacerbation, anemia, AKI, electrolyte derangement  Patient's presentation is most consistent with acute presentation with potential threat to life or bodily function.  Patient comes to the ED due to persistent shortness of breath since recent hospitalization.  Doubt ACS PE dissection.  He is not septic.  Vital signs are essentially normal.  Leukocytosis of 18,000 here I think is due to his daily prednisone  use at high dose, 60 mg daily currently.   ----------------------------------------- 5:48 AM on 04/19/2024 -----------------------------------------  Patient given prednisone , bronchodilators here in the ED and feeling better.  On reassessment he is able to stand and ambulate without worsening symptoms.  Oxygen saturation steady at 93% on room air.  States that he feels pretty good, back to normal.  Counseled patient on using albuterol  as needed, start twice daily Symbicort , make follow-up appointment with  pulmonology as referred by [facility].       FINAL CLINICAL IMPRESSION(S) / ED DIAGNOSES   Final diagnoses:  COPD exacerbation (HCC)     Rx / DC Orders   ED Discharge Orders          Ordered    guaiFENesin  (MUCINEX ) 600 MG 12 hr tablet  2 times daily        04/19/24 0544    albuterol  (PROVENTIL  HFA) 108 (90 Base) MCG/ACT inhaler  Every 4 hours PRN        04/19/24  9455    Spacer/Aero-Holding Chambers (AEROCHAMBER PLUS) Device        04/19/24 0544    budesonide -formoterol  (SYMBICORT ) 80-4.5 MCG/ACT inhaler  2 times daily        04/19/24 0544             Note:  This document was prepared using Dragon voice recognition software and may include unintentional dictation errors.   Viviann Pastor, MD 04/19/24 [phone]

## 2024-04-19 NOTE — ED Triage Notes (Signed)
 Pt presents to the ED via [facility] with complaints of SOB with productive cough - recently D/C this AM for same. States that he has Hx of Lung cancer - 93% on RA initially with FD and was placed on 4L [place] and his sats improved to 96%. Pt received 125mg  Solu-medrol  and 2 duo nebs PTA. A&Ox4 at this time. Denies fevers.

## 2024-04-20 ENCOUNTER — Inpatient Hospital Stay: Admitting: Pulmonary Disease

## 2024-04-20 ENCOUNTER — Emergency Department

## 2024-04-20 DIAGNOSIS — J441 Chronic obstructive pulmonary disease with (acute) exacerbation: Secondary | ICD-10-CM | POA: Diagnosis not present

## 2024-04-20 LAB — CBC
HCT: 33.1 % — ABNORMAL LOW (ref 39.0–52.0)
HCT: 31.5 % — ABNORMAL LOW (ref 39.0–52.0)
Hemoglobin: 10.3 g/dL — ABNORMAL LOW (ref 13.0–17.0)
Hemoglobin: 10.2 g/dL — ABNORMAL LOW (ref 13.0–17.0)
MCH: 25.8 pg — ABNORMAL LOW (ref 26.0–34.0)
MCH: 25.9 pg — ABNORMAL LOW (ref 26.0–34.0)
MCHC: 31.1 g/dL (ref 30.0–36.0)
MCHC: 32.4 g/dL (ref 30.0–36.0)
MCV: 83 fL (ref 80.0–100.0)
MCV: 79.9 fL — ABNORMAL LOW (ref 80.0–100.0)
Platelets: 218 K/uL (ref 150–400)
Platelets: 210 K/uL (ref 150–400)
RBC: 3.99 MIL/uL — ABNORMAL LOW (ref 4.22–5.81)
RBC: 3.94 MIL/uL — ABNORMAL LOW (ref 4.22–5.81)
RDW: 21 % — ABNORMAL HIGH (ref 11.5–15.5)
RDW: 20.9 % — ABNORMAL HIGH (ref 11.5–15.5)
WBC: 16.6 K/uL — ABNORMAL HIGH (ref 4.0–10.5)
WBC: 10.3 K/uL (ref 4.0–10.5)
nRBC: 0 % (ref 0.0–0.2)
nRBC: 0 % (ref 0.0–0.2)

## 2024-04-20 LAB — URINE DRUG SCREEN, QUALITATIVE (ARMC ONLY)
Amphetamines, Ur Screen: NOT DETECTED
Barbiturates, Ur Screen: NOT DETECTED
Benzodiazepine, Ur Scrn: NOT DETECTED
Cannabinoid 50 Ng, Ur ~~LOC~~: NOT DETECTED
Cocaine Metabolite,Ur ~~LOC~~: NOT DETECTED
MDMA (Ecstasy)Ur Screen: NOT DETECTED
Methadone Scn, Ur: NOT DETECTED
Opiate, Ur Screen: NOT DETECTED
Phencyclidine (PCP) Ur S: NOT DETECTED
Tricyclic, Ur Screen: NOT DETECTED

## 2024-04-20 LAB — COMPREHENSIVE METABOLIC PANEL WITH GFR
ALT: 22 U/L (ref 0–44)
AST: 25 U/L (ref 15–41)
Albumin: 2.2 g/dL — ABNORMAL LOW (ref 3.5–5.0)
Alkaline Phosphatase: 86 U/L (ref 38–126)
Anion gap: 10 (ref 5–15)
BUN: 22 mg/dL (ref 8–23)
CO2: 23 mmol/L (ref 22–32)
Calcium: 8.2 mg/dL — ABNORMAL LOW (ref 8.9–10.3)
Chloride: 104 mmol/L (ref 98–111)
Creatinine, Ser: 0.77 mg/dL (ref 0.61–1.24)
GFR, Estimated: >60 mL/min (ref >60)
Glucose, Bld: 135 mg/dL — ABNORMAL HIGH (ref 70–99)
Potassium: 3.8 mmol/L (ref 3.5–5.1)
Sodium: 137 mmol/L (ref 135–145)
Total Bilirubin: 0.7 mg/dL (ref 0.0–1.2)
Total Protein: 6.5 g/dL (ref 6.5–8.1)

## 2024-04-20 LAB — BASIC METABOLIC PANEL WITH GFR
Anion gap: 11 (ref 5–15)
BUN: 22 mg/dL (ref 8–23)
CO2: 23 mmol/L (ref 22–32)
Calcium: 8 mg/dL — ABNORMAL LOW (ref 8.9–10.3)
Chloride: 103 mmol/L (ref 98–111)
Creatinine, Ser: 0.91 mg/dL (ref 0.61–1.24)
GFR, Estimated: >60 mL/min (ref >60)
Glucose, Bld: 106 mg/dL — ABNORMAL HIGH (ref 70–99)
Potassium: 3.6 mmol/L (ref 3.5–5.1)
Sodium: 137 mmol/L (ref 135–145)

## 2024-04-20 MED ORDER — IPRATROPIUM-ALBUTEROL 0.5-2.5 (3) MG/3ML IN SOLN
9.0000 mL | Freq: Once | RESPIRATORY_TRACT | Status: AC
  Administered 2024-04-20: 9 mL via RESPIRATORY_TRACT
  Filled 2024-04-20: qty 3

## 2024-04-20 MED ORDER — PREDNISONE 20 MG PO TABS
40.0000 mg | ORAL_TABLET | Freq: Every day | ORAL | Status: DC
Start: 2024-04-21 — End: 2024-04-20

## 2024-04-20 MED ORDER — ACETAMINOPHEN 650 MG RE SUPP: 650.0000 mg | Freq: Four times a day (QID) | RECTAL | Status: DC | PRN

## 2024-04-20 MED ORDER — ACETAMINOPHEN 325 MG PO TABS
650.0000 mg | ORAL_TABLET | Freq: Four times a day (QID) | ORAL | Status: DC | PRN
Start: 2024-04-20 — End: 2024-04-20

## 2024-04-20 MED ORDER — ONDANSETRON HCL 4 MG PO TABS
4.0000 mg | ORAL_TABLET | Freq: Four times a day (QID) | ORAL | Status: DC | PRN
Start: 2024-04-20 — End: 2024-04-20

## 2024-04-20 MED ORDER — METHYLPREDNISOLONE SODIUM SUCC 40 MG IJ SOLR
40.0000 mg | Freq: Two times a day (BID) | INTRAMUSCULAR | Status: DC
  Administered 2024-04-20: 40 mg via INTRAVENOUS
  Filled 2024-04-20: qty 1

## 2024-04-20 MED ORDER — METHYLPREDNISOLONE SODIUM SUCC 125 MG IJ SOLR
125.0000 mg | Freq: Once | INTRAMUSCULAR | Status: AC
  Administered 2024-04-20: 125 mg via INTRAVENOUS
  Filled 2024-04-20: qty 2

## 2024-04-20 MED ORDER — GUAIFENESIN ER 600 MG PO TB12
600.0000 mg | ORAL_TABLET | Freq: Two times a day (BID) | ORAL | Status: DC
  Administered 2024-04-20: 600 mg via ORAL
  Filled 2024-04-20: qty 1

## 2024-04-20 MED ORDER — IPRATROPIUM-ALBUTEROL 0.5-2.5 (3) MG/3ML IN SOLN
3.0000 mL | Freq: Four times a day (QID) | RESPIRATORY_TRACT | Status: DC
  Administered 2024-04-20 (×2): 3 mL via RESPIRATORY_TRACT
  Filled 2024-04-20 (×2): qty 3

## 2024-04-20 MED ORDER — ENOXAPARIN SODIUM 40 MG/0.4ML IJ SOSY
40.0000 mg | PREFILLED_SYRINGE | INTRAMUSCULAR | Status: DC
  Administered 2024-04-20: 40 mg via SUBCUTANEOUS
  Filled 2024-04-20: qty 0.4

## 2024-04-20 MED ORDER — ONDANSETRON HCL 4 MG/2ML IJ SOLN: 4.0000 mg | Freq: Four times a day (QID) | INTRAMUSCULAR | Status: DC | PRN

## 2024-04-20 MED ORDER — ALBUTEROL SULFATE (2.5 MG/3ML) 0.083% IN NEBU: 2.5000 mg | INHALATION_SOLUTION | RESPIRATORY_TRACT | Status: DC | PRN

## 2024-04-20 NOTE — ED Provider Notes (Signed)
 [facility] Provider Note    Event Date/Time   First MD Initiated Contact with Patient 04/19/24 2355     (approximate)   History   Shortness of Breath   HPI  Timothy Matthews is a 78 y.o. male   Past medical history of lung cancer status post radiation therapy not on active chemotherapy, recent pneumonia, prior CVA, cocaine abuse, hypertension, emphysema and GERD who presents to Emergency Department with shortness of breath ongoing for the last several weeks, worsening.  Was seen this morning and got nebulizer treatments and steroids and felt better to go home.  However with very minimal exertion he gets severely dyspneic.  He has an ongoing cough.  He denies fevers or chills or chest pain.   External Medical Documents Reviewed: Discharge summary from hospitalization in June      Physical Exam   Triage Vital Signs: ED Triage Vitals  Encounter Vitals Group     BP 04/19/24 2349 (!) 152/60     Girls Systolic BP Percentile --      Girls Diastolic BP Percentile --      Boys Systolic BP Percentile --      Boys Diastolic BP Percentile --      Pulse Rate 04/19/24 2349 84     Resp 04/19/24 2349 (!) 25     Temp 04/19/24 2349 98 F (36.7 C)     Temp Source 04/19/24 2349 Oral     SpO2 04/19/24 2349 95 %     Weight 04/19/24 2347 159 lb 6.3 oz (72.3 kg)     Height 04/19/24 2347 5' 10 (1.778 m)     Head Circumference --      Peak Flow --      Pain Score 04/19/24 2345 7     Pain Loc --      Pain Education --      Exclude from Growth Chart --     Most recent vital signs: Vitals:   04/19/24 2349 04/20/24 0000  BP: (!) 152/60 128/87  Pulse: 84 89  Resp: (!) 25 (!) 25  Temp: 98 F (36.7 C)   SpO2: 95% 94%    General: Awake, no distress.  CV:  Good peripheral perfusion.  Resp:  Normal effort.  Abd:  No distention.  Other:  He looks short of breath and is tachypneic speaking in short phrases only.  He has no obvious focality or wheezing on lung  exam.  He has no peripheral edema noted.   ED Results / Procedures / Treatments   Labs (all labs ordered are listed, but only abnormal results are displayed) Labs Reviewed  BASIC METABOLIC PANEL WITH GFR  CBC     I ordered and reviewed the above labs they are notable for leukocytosis labs obtained last night   RADIOLOGY I independently reviewed and interpreted chest x-ray I see focalities in the right upper lung field that is read as consistent with prior I also reviewed radiologist's formal read.   PROCEDURES:  Critical Care performed: Yes, see critical care procedure note(s)  .Critical Care  Performed by: Cyrena Mylar, MD Authorized by: Cyrena Mylar, MD   Critical care provider statement:    Critical care time (minutes):  30   Critical care was time spent personally by me on the following activities:  Development of treatment plan with patient or surrogate, discussions with consultants, evaluation of patient's response to treatment, examination of patient, ordering and review of laboratory studies, ordering and  review of radiographic studies, ordering and performing treatments and interventions, pulse oximetry, re-evaluation of patient's condition and review of old charts    MEDICATIONS ORDERED IN ED: Medications  methylPREDNISolone  sodium succinate (SOLU-MEDROL ) 125 mg/2 mL injection 125 mg (125 mg Intravenous Given 04/20/24 0143)  ipratropium-albuterol  (DUONEB) 0.5-2.5 (3) MG/3ML nebulizer solution 9 mL (9 mLs Nebulization Given 04/20/24 0143)    External physician / consultants:  I spoke with hospital medicine for admission and regarding care plan for this patient.   IMPRESSION / MDM / ASSESSMENT AND PLAN / ED COURSE  I reviewed the triage vital signs and the nursing notes.                                Patient's presentation is most consistent with acute presentation with potential threat to life or bodily function.  Differential diagnosis includes, but is not  limited to, COPD exacerbation, lung cancer, lung infection, considered but less likely PE ACS   The patient is on the cardiac monitor to evaluate for evidence of arrhythmia and/or significant heart rate changes.  MDM:    Most likely an exacerbation of his emphysema given his response to nebulizer treatments and prednisone  earlier today but with ongoing symptoms quite symptomatic with minimal exertion I will admit him today given some more nebulizer treatments and a shot of Solu-Medrol .  His lung cancer is no longer on chemotherapy that he has some radiation related changes to his lungs that I think also affect his breathing.  He denies chest pain I doubt ACS.  He had labs from this morning which were largely unremarkable except for leukocytosis thought to be attributed by his steroid use.  He was placed on 4 L O2 initially as he was hypoxemic with EMS.  I considered PE but I think less likely given his response to nebulizer treatment, and the fact that he was symptomatic similarly just last month he got a CT angiogram was negative.   Admit        FINAL CLINICAL IMPRESSION(S) / ED DIAGNOSES   Final diagnoses:  SOB (shortness of breath)  Subacute cough  Malignant neoplasm of lung, unspecified laterality, unspecified part of lung (HCC)     Rx / DC Orders   ED Discharge Orders     None        Note:  This document was prepared using Dragon voice recognition software and may include unintentional dictation errors.    Cyrena Mylar, MD 04/20/24 [phone]

## 2024-04-20 NOTE — ED Notes (Signed)
Pt ambulatory to the bathroom without any difficulty

## 2024-04-20 NOTE — ED Notes (Signed)
Patient walked to bathroom at this time. 

## 2024-04-20 NOTE — H&P (Signed)
 History and Physical    Patient: Timothy Matthews FMW:[id number] DOB: June 07, 1946 DOA: 04/19/2024 DOS: the patient was seen and examined on 04/20/2024 PCP: Dorise Sonny Bradley, MD  Patient coming from: Home  Chief Complaint:  Chief Complaint  Patient presents with   Shortness of Breath    HPI: Timothy Matthews is a 78 y.o. male with medical history significant for interstitial lung disease, emphysema,  lung cancer, cocaine abuse, history of prior CVA, hospitalized from 6/13 to 03/28/2024 with postobstructive pneumonia(bronchoscopy on 6/16 canceled due to ongoing cocaine use), being admitted for persistent shortness of breath, prompting 2 ED visits in the past 24 hours.  He had an extensive pulmonary infectious workup during that admission including fungal cultures which were all negative.  He subsequently had an admission to the [facility] for shortness of breath and is currently on prednisone  60 mg daily. (Records unavailable).  Patient is unable to detail what was done for him at the [facility] and whether he had bronchoscopy or not. On his first visit to the ED at about 4 PM on 04/19/2024 his workup was reassuring except for leukocytosis of 18,000 that was attributed to prednisone .  He was treated with bronchodilators with improvement and discharged however he returned with recurrence of symptoms.  He denies chest pain, fever or chills. In the ED, tachypneic to 25 with O2 sats in the mid 90s on room air with otherwise unremarkable vitals.  Labs not repeated, but earlier CBC showed WBC of 17,000 and stable hemoglobin of 10.  Earlier BMP was stable from prior. Chest x-ray showed stable findings as follows: IMPRESSION: 1. Stable right upper lobe airspace disease and adjacent area of cavitation. 2. Small area of right apical pleural fluid versus pleural thickening. 3. Low lung volumes with mild, stable elevation of the right hemidiaphragm and diffusely prominent interstitial lung markings.  Patient treated with  Solu-Medrol  and DuoNeb Admission requested      Past Medical History:  Diagnosis Date   Cancer [facility])    Cocaine abuse (HCC)    last ED encounter 04/07/20   Emphysema lung (HCC)    pan-lobar by CT 2017   GERD (gastroesophageal reflux disease)    Hepatitis C    Hypertension    Prostate cancer (HCC) BPH   PTSD (post-traumatic stress disorder)    Past Surgical History:  Procedure Laterality Date   CERVICAL SPINE SURGERY  2017   Done at [facility] SPINE SURGERY     TEE WITHOUT CARDIOVERSION N/A 10/26/2020   Procedure: TRANSESOPHAGEAL ECHOCARDIOGRAM (TEE);  Surgeon: Bosie Vinie LABOR, MD;  Location: [facility] ORS;  Service: Cardiovascular;  Laterality: N/A;   TONSILLECTOMY     TRANSURETHRAL RESECTION OF PROSTATE     Social History:  reports that he has been smoking cigarettes. He has never used smokeless tobacco. He reports that he does not currently use alcohol . He reports current drug use. Drugs: Cocaine and Marijuana.  Allergies  Allergen Reactions   Trospium     Other Reaction(s): Blurring of visual image    Family History  Problem Relation Age of Onset   Breast cancer Mother    Diabetes Mother    Glaucoma Mother    Cancer Father    Alcohol  abuse Father    Pancreatic cancer Father     Prior to Admission medications   Medication Sig Start Date End Date Taking? Authorizing Provider  acetaminophen  (TYLENOL ) 325 MG tablet Take 650 mg by mouth every 8 (eight) hours as needed for  mild pain (pain score 1-3) (dental pain). 06/29/23   [provider]  albuterol  (PROVENTIL  HFA) 108 (90 Base) MCG/ACT inhaler Inhale 2 puffs into the lungs every 4 (four) hours as needed for wheezing or shortness of breath. 04/19/24   Viviann Pastor, MD  albuterol  (VENTOLIN  HFA) 108 [phone] Base) MCG/ACT inhaler Inhale 2 puffs into the lungs every 6 (six) hours as needed for wheezing or shortness of breath. 08/03/19   Edelmiro Leash, MD  aspirin  EC 81 MG EC tablet Take 1 tablet (81 mg total) by  mouth daily. Swallow whole. Patient not taking: Reported on 03/25/2024 10/30/20   Leotis Bogus, MD  atorvastatin  (LIPITOR) 40 MG tablet Take 1 tablet (40 mg total) by mouth daily. 10/30/20 09/03/22  Leotis Bogus, MD  azelastine (ASTELIN) 0.1 % nasal spray Place 2 sprays into both nostrils 2 (two) times daily as needed (runny nose/pnd). 06/29/23   [provider]  brompheniramine-pseudoephedrine-DM 30-2-10 MG/5ML syrup Take 5 mLs by mouth 4 (four) times daily as needed. 02/08/24   Janit Kast, PA-C  budesonide -formoterol  (SYMBICORT ) 80-4.5 MCG/ACT inhaler Inhale 2 puffs into the lungs 2 (two) times daily. 04/19/24 06/18/24  Viviann Pastor, MD  fluticasone (FLONASE) 50 MCG/ACT nasal spray Place 2 sprays into both nostrils at bedtime. 06/29/23   [provider]  gabapentin  (NEURONTIN ) 300 MG capsule Take 600 mg by mouth 3 (three) times daily.    [provider]  guaiFENesin  (MUCINEX ) 600 MG 12 hr tablet Take 1 tablet (600 mg total) by mouth 2 (two) times daily for 15 days. 04/19/24 05/04/24  Viviann Pastor, MD  guaiFENesin  (ROBITUSSIN) 100 MG/5ML liquid Take 5 mLs by mouth 4 (four) times daily as needed for cough or to loosen phlegm. Take 1 teaspoonful by mouth four times a day as needed for cough and congestion 06/29/23   [provider]  HYDROPHILIC EX Apply 1 Application topically daily as needed (dry skin). 05/06/23   [provider]  melatonin 3 MG TABS tablet Take 3-6 mg by mouth at bedtime as needed (sleep). Take one capsule/tablet by mouth at bedtime as needed. Okay to increase to 6mg  (2caps/tabs) 05/06/23   [provider]  nicotine polacrilex (COMMIT) 2 MG lozenge Take 2 mg by mouth as needed for smoking cessation. DISSOLVE 1 LOZENGE BY MOUTH AS DIRECTED FOR SMOKING CESSATION USE AT LEAST 8-10 LOZENGES EACH DAY TO START. DO NOT USE MORE THAN 20 LOZENGES IN A DAY. DO NOT EAT OR DRINK FOR 15 MINUTES BEFORE AND DURING USE 05/06/23   [provider]  ondansetron  (ZOFRAN ) 8 MG tablet Take 8 mg by mouth every 8 (eight) hours as needed for nausea or vomiting. 10/26/23   [provider]  prochlorperazine (COMPAZINE) 5 MG tablet Take 10 mg by mouth every 6 (six) hours as needed for nausea or vomiting. 10/26/23   [provider]  Spacer/Aero-Holding Chambers (AEROCHAMBER PLUS) Device Use as instructed 04/19/24   Viviann Pastor, MD  tamsulosin  (FLOMAX ) 0.4 MG CAPS capsule Take 0.4 mg by mouth at bedtime.    [provider]  terazosin (HYTRIN) 10 MG capsule Take 20 mg by mouth at bedtime. 12/26/08   [provider]  traZODone (DESYREL) 50 MG tablet Take 50 mg by mouth at bedtime. 11/06/23   [provider]    Physical Exam: Vitals:   04/19/24 2347 04/19/24 2349 04/20/24 0000  BP:  (!) 152/60 128/87  Pulse:  84 89  Resp:  (!) 25 (!) 25  Temp:  98  F (36.7 C)   TempSrc:  Oral   SpO2:  95% 94%  Weight: 72.3 kg    Height: 5' 10 (1.778 m)     Physical Exam Vitals and nursing note reviewed.  Constitutional:      General: He is not in acute distress. HENT:     Head: Normocephalic and atraumatic.  Cardiovascular:     Rate and Rhythm: Normal rate and regular rhythm.     Heart sounds: Normal heart sounds.  Pulmonary:     Effort: Tachypnea present.     Breath sounds: Normal breath sounds.     Comments: Faint wheezes Abdominal:     Palpations: Abdomen is soft.     Tenderness: There is no abdominal tenderness.  Neurological:     Mental Status: Mental status is at baseline.     Labs on Admission: I have personally reviewed following labs and imaging studies  CBC: Recent Labs  Lab 04/18/24 2345  WBC 17.8*  HGB 10.2*  HCT 31.8*  MCV 81.1  PLT 232   Basic Metabolic Panel: Recent Labs  Lab 04/18/24 2345  NA 137  K 3.7  CL 104  CO2 23  GLUCOSE 105*  BUN 26*  CREATININE 0.75  CALCIUM  8.1*   GFR: Estimated Creatinine Clearance: 81.6 mL/min (by C-G formula based on  SCr of 0.75 mg/dL). Liver Function Tests: No results for input(s): AST, ALT, ALKPHOS, BILITOT, PROT, ALBUMIN in the last 168 hours. No results for input(s): LIPASE, AMYLASE in the last 168 hours. No results for input(s): AMMONIA in the last 168 hours. Coagulation Profile: No results for input(s): INR, PROTIME in the last 168 hours. Cardiac Enzymes: No results for input(s): CKTOTAL, CKMB, CKMBINDEX, TROPONINI in the last 168 hours. BNP (last 3 results) No results for input(s): PROBNP in the last 8760 hours. HbA1C: No results for input(s): HGBA1C in the last 72 hours. CBG: No results for input(s): GLUCAP in the last 168 hours. Lipid Profile: No results for input(s): CHOL, HDL, LDLCALC, TRIG, CHOLHDL, LDLDIRECT in the last 72 hours. Thyroid  Function Tests: No results for input(s): TSH, T4TOTAL, FREET4, T3FREE, THYROIDAB in the last 72 hours. Anemia Panel: No results for input(s): VITAMINB12, FOLATE, FERRITIN, TIBC, IRON, RETICCTPCT in the last 72 hours. Urine analysis:    Component Value Date/Time   COLORURINE YELLOW (A) 03/17/2022 1328   APPEARANCEUR CLOUDY (A) 03/17/2022 1328   APPEARANCEUR Clear 09/21/2014 1319   LABSPEC 1.021 03/17/2022 1328   LABSPEC 1.021 09/21/2014 1319   PHURINE 5.0 03/17/2022 1328   GLUCOSEU NEGATIVE 03/17/2022 1328   GLUCOSEU Negative 09/21/2014 1319   HGBUR LARGE (A) 03/17/2022 1328   BILIRUBINUR NEGATIVE 03/17/2022 1328   BILIRUBINUR Negative 09/21/2014 1319   KETONESUR NEGATIVE 03/17/2022 1328   PROTEINUR 100 (A) 03/17/2022 1328   NITRITE NEGATIVE 03/17/2022 1328   LEUKOCYTESUR LARGE (A) 03/17/2022 1328   LEUKOCYTESUR Negative 09/21/2014 1319    Radiological Exams on Admission: DG Chest 2 View Result Date: 04/20/2024 CLINICAL DATA:  Shortness of breath and productive cough. EXAM: CHEST - 2 VIEW COMPARISON:  April 19, 2024 FINDINGS: The heart size and mediastinal contours are  within normal limits. Low lung volumes are noted with mild, stable elevation of the right hemidiaphragm and stable diffusely prominent interstitial lung markings. Stable airspace disease and adjacent area of cavitation is seen within the right upper lobe. Involvement of the adjacent portions of the right middle lobe and right lower lobe are seen. There is a small area of right apical pleural  fluid versus pleural thickening. No pneumothorax is identified. Postoperative changes are seen within the lower cervical and upper thoracic spine. The visualized skeletal structures are unremarkable. IMPRESSION: 1. Stable right upper lobe airspace disease and adjacent area of cavitation. 2. Small area of right apical pleural fluid versus pleural thickening. 3. Low lung volumes with mild, stable elevation of the right hemidiaphragm and diffusely prominent interstitial lung markings. Electronically Signed   By: [name] Dials M.D.   On: 04/20/2024 00:55   DG Chest Port 1 View Result Date: 04/19/2024 CLINICAL DATA:  [id number] SOB (shortness of breath) [id number] EXAM: PORTABLE CHEST 1 VIEW COMPARISON:  Chest x-ray 03/25/2024, CT angio chest 03/25/2024 FINDINGS: The heart and mediastinal contours are unchanged. Coarsened interstitial markings with improved aeration of the right apex but worsened airspace opacity of the inferior portion of the right upper lobe and superior portion of the left upper lobe. Central lucency within the right upper lobe suggestive of known cavitation. Question small loculated right apical pleural effusion. No pneumothorax. No acute osseous abnormality. IMPRESSION: 1. Improved aeration of the right apex but worsened airspace opacity of the inferior portion of the right upper lobe and superior portion of the left upper lobe. Central lucency within the right upper lobe suggestive of known cavitation. Question small loculated right apical pleural effusion. Concern for multifocal pneumonia with known underlying  mass. Consider pulmonary consultation for consideration of further imaging such as PET CT. 2. Underlying interstitial lung disease. Electronically Signed   By: Morgane  Naveau M.D.   On: 04/19/2024 00:52   Data Reviewed for HPI: Relevant notes from primary care and specialist visits, past discharge summaries as available in EHR, including Care Everywhere. Prior diagnostic testing as pertinent to current admission diagnoses Updated medications and problem lists for reconciliation ED course, including vitals, labs, imaging, treatment and response to treatment Triage notes, nursing and pharmacy notes and ED provider's notes Notable results as noted above in HPI      Assessment and Plan: * COPD with acute exacerbation (HCC) Scheduled and as needed nebulized bronchodilators IV steroids Antitussives Flutter valve Supplemental oxygen if needed   Right sided lung cancer - [facility] so [facility] is [facility]    Hypertension - Currently at goal without medications.    Tobacco abuse - Patient reports he quit smoking    Polysubstance abuse/cocaine - Will check UDS in case of need for bronchoscopy      DVT prophylaxis: Lovenox   Consults: none  Advance Care Planning:   Code Status: Prior   Family Communication: none  Disposition Plan: Back to previous home environment  Severity of Illness: The appropriate patient status for this patient is OBSERVATION. Observation status is judged to be reasonable and necessary in order to provide the required intensity of service to ensure the patient's safety. The patient's presenting symptoms, physical exam findings, and initial radiographic and laboratory data in the context of their medical condition is felt to place them at decreased risk for further clinical deterioration. Furthermore, it is anticipated that the patient will be medically stable for discharge from the hospital within 2 midnights of  admission.   Author: Delayne LULLA Solian, MD 04/20/2024 2:27 AM  For on call review [email or web address].

## 2024-04-20 NOTE — ED Notes (Signed)
 Turn TV volume up on TV.

## 2024-04-20 NOTE — Progress Notes (Signed)
   Interim no charge progress note   This patient, Timothy Matthews is a 78 y.o. male who was admitted by colleague earlier this morning for COPD exacerbation and right sided lung cancer.  On my evaluation patient appears very comfortable.  He is high readmission risk.  He presented to the ER twice within 12 hours due to significant dyspnea on exertion.  Will obtain PT evals.  Patient may do better at skilled nursing/rehab before returning to his home given his significant deconditioning. In the interim we will continue with IV steroids with plans to taper to p.o. tomorrow.      04/20/2024   12:24 PM 04/20/2024    9:38 AM 04/20/2024    5:00 AM  Vitals with BMI  Systolic 142 144 863  Diastolic 84 87 88  Pulse 75 74 75       Latest Ref Rng & Units 04/20/2024   12:10 PM 04/19/2024   11:49 PM 04/18/2024   11:45 PM  CBC  WBC 4.0 - 10.5 K/uL 10.3  16.6  17.8   Hemoglobin 13.0 - 17.0 g/dL 89.7  89.6  89.7   Hematocrit 39.0 - 52.0 % 31.5  33.1  31.8   Platelets 150 - 400 K/uL 210  218  232        Latest Ref Rng & Units 04/20/2024   12:10 PM 04/19/2024   11:49 PM 04/18/2024   11:45 PM  BMP  Glucose 70 - 99 mg/dL 864  893  894   BUN 8 - 23 mg/dL 22  22  26    Creatinine 0.61 - 1.24 mg/dL 9.22  9.08  9.24   Sodium 135 - 145 mmol/L 137  137  137   Potassium 3.5 - 5.1 mmol/L 3.8  3.6  3.7   Chloride 98 - 111 mmol/L 104  103  104   CO2 22 - 32 mmol/L 23  23  23    Calcium  8.9 - 10.3 mg/dL 8.2  8.0  8.1

## 2024-04-20 NOTE — ED Notes (Signed)
 Pt taken to xray

## 2024-04-20 NOTE — Assessment & Plan Note (Signed)
 Scheduled and as needed nebulized bronchodilators IV steroids Antitussives Flutter valve Supplemental oxygen if needed

## 2024-04-20 NOTE — ED Notes (Signed)
 Pt provided a sandwich tray and beverage. Ok'd by EDP.

## 2024-04-20 NOTE — ED Notes (Signed)
 Pt stated he did not want to lay around any longer and signed out AMA. Pt made aware of risks of leaving without completing treatment. Dezii, DO, made aware.

## 2024-04-20 NOTE — ED Notes (Addendum)
 Pt calling out at this time using call light. Pt stated, I am hungry. Rosina, RN stated it was fine if pt had food. Pt given a [place] sandwich tray and a cup of coke at this time. Call light within reach. Pt has no further needs at this time.

## 2024-04-20 NOTE — ED Notes (Signed)
 Pt using call light to call out for assistance. Pt with urge to urinate. Pt ambulatory to the bathroom with this tech at side. Pt urinated, hand hygiene performed, pt ambulatory back to rm and placed  back on monitor. Call light within reach. Pt has no further needs at this time.

## 2024-04-20 NOTE — ED Notes (Addendum)
 Pt ambulated to the bathroom without difficulty. Pt stated they were freezing when walking out of the bathroom. Pt was given another warm blanket at this time.

## 2024-04-21 NOTE — Discharge Summary (Signed)
   PT LEFT AMA DISCHARGE SUMMARY  Timothy Matthews MRN - [id number] DOB - 04-22-1946  Date of Admission - 04/19/2024 Date LEFT AMA: 04/20/2024  Attending Physician:  Lorane Poland, DO  Patient's PCP:  Dorise Sonny Bradley, MD  Disposition: LEFT AMA  Follow-up Appts:  Not able to be arranged or discussed as pt LEFT AMA  Diagnoses at time pt LEFT AMA: COPD Exacerbation Lung Cancer   Hospital Course:  Timothy Matthews is a 78 y.o. male with medical history significant for interstitial lung disease, emphysema,  lung cancer, cocaine abuse, history of prior CVA, hospitalized from 6/13 to 03/28/2024 with postobstructive pneumonia(bronchoscopy on 6/16 canceled due to ongoing cocaine use).  Patient has known large right lung mass that needs further evaluation manage difficulty with follow-up at the [facility] and outpatient.  He presented to the ED twice in the last 24 hours for shortness of breath.  On this admission he was admitted for COPD exacerbation.  He was admitted on 7/9.  At the time of my evaluation later on the day 7/9 patient was still dyspneic and weak.  I advised the patient he stay for physical therapy evaluations as he may benefit from SNF placement.  I discussed with the patient that he has already returned to the hospital twice in the last 24 hours as he was unsafe to take care of himself at home.  Patient consented to staying for additional workup and therapy evaluations.  I was notified by RN at [phone] that patient was leaving AMA.    Medication List    Unable to be finalized as pt LEFT AMA  Day of Discharge Wt Readings from Last 3 Encounters:  04/19/24 72.3 kg  03/25/24 72.6 kg  02/08/24 72.6 kg   Temp Readings from Last 3 Encounters:  04/20/24 98.1 F (36.7 C) (Oral)  04/19/24 98.4 F (36.9 C) (Oral)  03/28/24 98 F (36.7 C) (Oral)   BP Readings from Last 3 Encounters:  04/20/24 (!) 142/77  04/19/24 (!) 149/98  03/28/24 123/75   Pulse Readings from Last 3 Encounters:   04/20/24 70  04/19/24 78  03/28/24 74    Physical Exam: Exam not able to be completed at time of d/c as pt LEFT AMA  8:06 AM 04/21/24  Lorane Poland, DO [facility] Office  [phone]

## 2024-05-05 ENCOUNTER — Other Ambulatory Visit: Payer: Self-pay

## 2024-05-05 ENCOUNTER — Inpatient Hospital Stay
Admission: EM | Admit: 2024-05-05 | Discharge: 2024-05-20 | DRG: 180 | Disposition: A | Discharge status: Other Acute Inpatient Hospital | Attending: Osteopathic Medicine | Admitting: Osteopathic Medicine

## 2024-05-05 ENCOUNTER — Emergency Department

## 2024-05-05 DIAGNOSIS — Z79899 Other long term (current) drug therapy: Secondary | ICD-10-CM

## 2024-05-05 DIAGNOSIS — Z8701 Personal history of pneumonia (recurrent): Secondary | ICD-10-CM

## 2024-05-05 DIAGNOSIS — Z7982 Long term (current) use of aspirin: Secondary | ICD-10-CM

## 2024-05-05 DIAGNOSIS — Z8673 Personal history of transient ischemic attack (TIA), and cerebral infarction without residual deficits: Secondary | ICD-10-CM | POA: Diagnosis not present

## 2024-05-05 DIAGNOSIS — F1410 Cocaine abuse, uncomplicated: Secondary | ICD-10-CM | POA: Diagnosis present

## 2024-05-05 DIAGNOSIS — C3411 Malignant neoplasm of upper lobe, right bronchus or lung: Principal | ICD-10-CM | POA: Diagnosis present

## 2024-05-05 DIAGNOSIS — Z515 Encounter for palliative care: Secondary | ICD-10-CM | POA: Diagnosis not present

## 2024-05-05 DIAGNOSIS — I110 Hypertensive heart disease with heart failure: Secondary | ICD-10-CM | POA: Diagnosis present

## 2024-05-05 DIAGNOSIS — F17210 Nicotine dependence, cigarettes, uncomplicated: Secondary | ICD-10-CM | POA: Diagnosis present

## 2024-05-05 DIAGNOSIS — J441 Chronic obstructive pulmonary disease with (acute) exacerbation: Secondary | ICD-10-CM | POA: Diagnosis present

## 2024-05-05 DIAGNOSIS — Z8546 Personal history of malignant neoplasm of prostate: Secondary | ICD-10-CM | POA: Diagnosis not present

## 2024-05-05 DIAGNOSIS — J470 Bronchiectasis with acute lower respiratory infection: Secondary | ICD-10-CM | POA: Diagnosis present

## 2024-05-05 DIAGNOSIS — I10 Essential (primary) hypertension: Secondary | ICD-10-CM | POA: Diagnosis not present

## 2024-05-05 DIAGNOSIS — Z7951 Long term (current) use of inhaled steroids: Secondary | ICD-10-CM | POA: Diagnosis not present

## 2024-05-05 DIAGNOSIS — J984 Other disorders of lung: Principal | ICD-10-CM

## 2024-05-05 DIAGNOSIS — N400 Enlarged prostate without lower urinary tract symptoms: Secondary | ICD-10-CM | POA: Insufficient documentation

## 2024-05-05 DIAGNOSIS — Z9221 Personal history of antineoplastic chemotherapy: Secondary | ICD-10-CM

## 2024-05-05 DIAGNOSIS — C3491 Malignant neoplasm of unspecified part of right bronchus or lung: Secondary | ICD-10-CM | POA: Diagnosis not present

## 2024-05-05 DIAGNOSIS — Z800 Family history of malignant neoplasm of digestive organs: Secondary | ICD-10-CM

## 2024-05-05 DIAGNOSIS — J189 Pneumonia, unspecified organism: Secondary | ICD-10-CM | POA: Diagnosis not present

## 2024-05-05 DIAGNOSIS — Z811 Family history of alcohol abuse and dependence: Secondary | ICD-10-CM

## 2024-05-05 DIAGNOSIS — J9601 Acute respiratory failure with hypoxia: Secondary | ICD-10-CM | POA: Diagnosis present

## 2024-05-05 DIAGNOSIS — Z7189 Other specified counseling: Secondary | ICD-10-CM

## 2024-05-05 DIAGNOSIS — Z923 Personal history of irradiation: Secondary | ICD-10-CM | POA: Diagnosis not present

## 2024-05-05 DIAGNOSIS — J188 Other pneumonia, unspecified organism: Principal | ICD-10-CM | POA: Diagnosis present

## 2024-05-05 DIAGNOSIS — Z91199 Patient's noncompliance with other medical treatment and regimen due to unspecified reason: Secondary | ICD-10-CM

## 2024-05-05 DIAGNOSIS — N401 Enlarged prostate with lower urinary tract symptoms: Secondary | ICD-10-CM | POA: Diagnosis present

## 2024-05-05 DIAGNOSIS — G629 Polyneuropathy, unspecified: Secondary | ICD-10-CM | POA: Diagnosis present

## 2024-05-05 DIAGNOSIS — Z91148 Patient's other noncompliance with medication regimen for other reason: Secondary | ICD-10-CM

## 2024-05-05 DIAGNOSIS — J440 Chronic obstructive pulmonary disease with acute lower respiratory infection: Secondary | ICD-10-CM | POA: Diagnosis present

## 2024-05-05 DIAGNOSIS — E785 Hyperlipidemia, unspecified: Secondary | ICD-10-CM | POA: Diagnosis present

## 2024-05-05 DIAGNOSIS — Z803 Family history of malignant neoplasm of breast: Secondary | ICD-10-CM

## 2024-05-05 DIAGNOSIS — Z789 Other specified health status: Secondary | ICD-10-CM | POA: Diagnosis not present

## 2024-05-05 DIAGNOSIS — Z833 Family history of diabetes mellitus: Secondary | ICD-10-CM

## 2024-05-05 DIAGNOSIS — J8410 Pulmonary fibrosis, unspecified: Secondary | ICD-10-CM | POA: Diagnosis present

## 2024-05-05 DIAGNOSIS — J439 Emphysema, unspecified: Secondary | ICD-10-CM | POA: Diagnosis present

## 2024-05-05 DIAGNOSIS — Z83511 Family history of glaucoma: Secondary | ICD-10-CM

## 2024-05-05 DIAGNOSIS — I5033 Acute on chronic diastolic (congestive) heart failure: Secondary | ICD-10-CM | POA: Diagnosis not present

## 2024-05-05 DIAGNOSIS — Z888 Allergy status to other drugs, medicaments and biological substances status: Secondary | ICD-10-CM

## 2024-05-05 DIAGNOSIS — K219 Gastro-esophageal reflux disease without esophagitis: Secondary | ICD-10-CM | POA: Diagnosis present

## 2024-05-05 DIAGNOSIS — Z5982 Transportation insecurity: Secondary | ICD-10-CM

## 2024-05-05 HISTORY — DX: Failed or difficult intubation, initial encounter: T88.4XXA

## 2024-05-05 LAB — CBC WITH DIFFERENTIAL/PLATELET
Abs Immature Granulocytes: 0.09 K/uL — ABNORMAL HIGH (ref 0.00–0.07)
Basophils Absolute: 0 K/uL (ref 0.0–0.1)
Basophils Relative: 0 %
Eosinophils Absolute: 0 K/uL (ref 0.0–0.5)
Eosinophils Relative: 0 %
HCT: 34.1 % — ABNORMAL LOW (ref 39.0–52.0)
Hemoglobin: 10.8 g/dL — ABNORMAL LOW (ref 13.0–17.0)
Immature Granulocytes: 1 %
Lymphocytes Relative: 5 %
Lymphs Abs: 0.5 K/uL — ABNORMAL LOW (ref 0.7–4.0)
MCH: 25.8 pg — ABNORMAL LOW (ref 26.0–34.0)
MCHC: 31.7 g/dL (ref 30.0–36.0)
MCV: 81.6 fL (ref 80.0–100.0)
Monocytes Absolute: 0.3 K/uL (ref 0.1–1.0)
Monocytes Relative: 3 %
Neutro Abs: 8.7 K/uL — ABNORMAL HIGH (ref 1.7–7.7)
Neutrophils Relative %: 91 %
Platelets: 216 K/uL (ref 150–400)
RBC: 4.18 MIL/uL — ABNORMAL LOW (ref 4.22–5.81)
RDW: 21.2 % — ABNORMAL HIGH (ref 11.5–15.5)
Smear Review: NORMAL
WBC: 9.5 K/uL (ref 4.0–10.5)
nRBC: 0 % (ref 0.0–0.2)

## 2024-05-05 LAB — BASIC METABOLIC PANEL WITH GFR
Anion gap: 7 (ref 5–15)
BUN: 30 mg/dL — ABNORMAL HIGH (ref 8–23)
CO2: 24 mmol/L (ref 22–32)
Calcium: 8.4 mg/dL — ABNORMAL LOW (ref 8.9–10.3)
Chloride: 107 mmol/L (ref 98–111)
Creatinine, Ser: 0.85 mg/dL (ref 0.61–1.24)
GFR, Estimated: >60 mL/min (ref >60)
Glucose, Bld: 159 mg/dL — ABNORMAL HIGH (ref 70–99)
Potassium: 4 mmol/L (ref 3.5–5.1)
Sodium: 138 mmol/L (ref 135–145)

## 2024-05-05 MED ORDER — IPRATROPIUM-ALBUTEROL 0.5-2.5 (3) MG/3ML IN SOLN
3.0000 mL | Freq: Four times a day (QID) | RESPIRATORY_TRACT | Status: DC
  Administered 2024-05-06 – 2024-05-07 (×4): 3 mL via RESPIRATORY_TRACT
  Filled 2024-05-05 (×4): qty 3

## 2024-05-05 MED ORDER — ATORVASTATIN CALCIUM 20 MG PO TABS
40.0000 mg | ORAL_TABLET | Freq: Every day | ORAL | Status: DC
  Administered 2024-05-06 – 2024-05-20 (×13): 40 mg via ORAL
  Filled 2024-05-05 (×15): qty 2

## 2024-05-05 MED ORDER — VANCOMYCIN HCL IN DEXTROSE 1-5 GM/200ML-% IV SOLN: 1000.0000 mg | Freq: Once | INTRAVENOUS | Status: DC

## 2024-05-05 MED ORDER — PREDNISONE 20 MG PO TABS
60.0000 mg | ORAL_TABLET | Freq: Once | ORAL | Status: AC
  Administered 2024-05-05: 60 mg via ORAL
  Filled 2024-05-05: qty 3

## 2024-05-05 MED ORDER — VANCOMYCIN HCL 1750 MG/350ML IV SOLN
1750.0000 mg | Freq: Once | INTRAVENOUS | Status: AC
  Administered 2024-05-06: 1750 mg via INTRAVENOUS
  Filled 2024-05-05: qty 350

## 2024-05-05 MED ORDER — METHYLPREDNISOLONE SODIUM SUCC 40 MG IJ SOLR
40.0000 mg | Freq: Two times a day (BID) | INTRAMUSCULAR | Status: AC
  Administered 2024-05-06 (×2): 40 mg via INTRAVENOUS
  Filled 2024-05-05 (×2): qty 1

## 2024-05-05 MED ORDER — MAGNESIUM HYDROXIDE 400 MG/5ML PO SUSP
30.0000 mL | Freq: Every day | ORAL | Status: DC | PRN
Start: 2024-05-05 — End: 2024-05-21
  Administered 2024-05-12: 30 mL via ORAL
  Filled 2024-05-05: qty 30

## 2024-05-05 MED ORDER — GABAPENTIN 300 MG PO CAPS
600.0000 mg | ORAL_CAPSULE | Freq: Three times a day (TID) | ORAL | Status: DC
Start: 2024-05-05 — End: 2024-05-21
  Administered 2024-05-06 – 2024-05-20 (×35): 600 mg via ORAL
  Filled 2024-05-05 (×41): qty 2

## 2024-05-05 MED ORDER — ACETAMINOPHEN 650 MG RE SUPP: 650.0000 mg | Freq: Four times a day (QID) | RECTAL | Status: DC | PRN

## 2024-05-05 MED ORDER — SODIUM CHLORIDE 0.9 % IV SOLN
2.0000 g | Freq: Once | INTRAVENOUS | Status: AC
  Administered 2024-05-05: 2 g via INTRAVENOUS
  Filled 2024-05-05: qty 20

## 2024-05-05 MED ORDER — MELATONIN 5 MG PO TABS
5.0000 mg | ORAL_TABLET | Freq: Every evening | ORAL | Status: DC | PRN
  Administered 2024-05-08 – 2024-05-14 (×2): 5 mg via ORAL
  Filled 2024-05-05 (×2): qty 1

## 2024-05-05 MED ORDER — TRAZODONE HCL 50 MG PO TABS
25.0000 mg | ORAL_TABLET | Freq: Every evening | ORAL | Status: DC | PRN
  Administered 2024-05-08 – 2024-05-16 (×4): 25 mg via ORAL
  Filled 2024-05-05 (×4): qty 1

## 2024-05-05 MED ORDER — GUAIFENESIN ER 600 MG PO TB12
600.0000 mg | ORAL_TABLET | Freq: Two times a day (BID) | ORAL | Status: DC
  Administered 2024-05-06 – 2024-05-20 (×27): 600 mg via ORAL
  Filled 2024-05-05 (×30): qty 1

## 2024-05-05 MED ORDER — SODIUM CHLORIDE 0.9 % IV SOLN: INTRAVENOUS | Status: AC

## 2024-05-05 MED ORDER — SODIUM CHLORIDE 0.9 % IV SOLN
500.0000 mg | Freq: Once | INTRAVENOUS | Status: AC
  Administered 2024-05-06: 500 mg via INTRAVENOUS
  Filled 2024-05-05: qty 5

## 2024-05-05 MED ORDER — ONDANSETRON HCL 4 MG PO TABS: 4.0000 mg | ORAL_TABLET | Freq: Four times a day (QID) | ORAL | Status: DC | PRN

## 2024-05-05 MED ORDER — HYDROCOD POLI-CHLORPHE POLI ER 10-8 MG/5ML PO SUER
5.0000 mL | Freq: Two times a day (BID) | ORAL | Status: DC | PRN
  Administered 2024-05-06 – 2024-05-19 (×9): 5 mL via ORAL
  Filled 2024-05-05 (×9): qty 5

## 2024-05-05 MED ORDER — SODIUM CHLORIDE 0.9 % IV SOLN
500.0000 mg | INTRAVENOUS | Status: AC
  Administered 2024-05-07 – 2024-05-09 (×4): 500 mg via INTRAVENOUS
  Filled 2024-05-05 (×4): qty 5

## 2024-05-05 MED ORDER — ACETAMINOPHEN 325 MG PO TABS
650.0000 mg | ORAL_TABLET | Freq: Four times a day (QID) | ORAL | Status: DC | PRN
  Administered 2024-05-18: 650 mg via ORAL
  Filled 2024-05-05 (×2): qty 2

## 2024-05-05 MED ORDER — IPRATROPIUM-ALBUTEROL 0.5-2.5 (3) MG/3ML IN SOLN
3.0000 mL | RESPIRATORY_TRACT | Status: DC | PRN
  Administered 2024-05-07 – 2024-05-18 (×7): 3 mL via RESPIRATORY_TRACT
  Filled 2024-05-05 (×6): qty 3

## 2024-05-05 MED ORDER — ONDANSETRON HCL 4 MG/2ML IJ SOLN: 4.0000 mg | Freq: Four times a day (QID) | INTRAMUSCULAR | Status: DC | PRN

## 2024-05-05 MED ORDER — PREDNISONE 20 MG PO TABS
40.0000 mg | ORAL_TABLET | Freq: Every day | ORAL | Status: AC
  Administered 2024-05-07 – 2024-05-09 (×3): 40 mg via ORAL
  Filled 2024-05-05 (×4): qty 2

## 2024-05-05 MED ORDER — IPRATROPIUM-ALBUTEROL 0.5-2.5 (3) MG/3ML IN SOLN
3.0000 mL | Freq: Once | RESPIRATORY_TRACT | Status: AC
  Administered 2024-05-05: 3 mL via RESPIRATORY_TRACT
  Filled 2024-05-05: qty 3

## 2024-05-05 MED ORDER — SODIUM CHLORIDE 0.9 % IV SOLN: 2.0000 g | INTRAVENOUS | Status: DC

## 2024-05-05 MED ORDER — ENOXAPARIN SODIUM 40 MG/0.4ML IJ SOSY
40.0000 mg | PREFILLED_SYRINGE | INTRAMUSCULAR | Status: DC
  Administered 2024-05-06 – 2024-05-20 (×14): 40 mg via SUBCUTANEOUS
  Filled 2024-05-05 (×15): qty 0.4

## 2024-05-05 NOTE — ED Triage Notes (Signed)
 Pt reports shortness of breath that began a few months ago, pt reports he was seen for same earlier this month and left AMA . Pt has hx lung cancer, not currently receiving treatment. Pt also reports cough and congestion.

## 2024-05-05 NOTE — ED Notes (Signed)
 Pt placed on 2L NC for comfort.

## 2024-05-05 NOTE — ED Provider Notes (Signed)
 [facility] Provider Note    Event Date/Time   First MD Initiated Contact with Patient 05/05/24 2159     (approximate)   History   Chief Complaint Shortness of Breath   HPI  Timothy Matthews is a 78 y.o. male with past medical history of hypertension, stroke, COPD, lung cancer, and cocaine abuse who presents to the ED complaining of shortness of breath.  Patient reports that he has been increasingly short of breath with a productive cough over the past 3 to 4 days.  He denies any fevers but has pain in his chest when he goes to cough.  He has not noticed any pain or swelling in his legs, has not been taking anything for his symptoms at home.  He was admitted to the hospital earlier this month for similar symptoms, signed out AMA after less than 24 hours in the hospital at that time.  [facility] have not [facility] for him.  He states he has quit smoking and has not used cocaine in the past 2 months.     Physical Exam   Triage Vital Signs: ED Triage Vitals  Encounter Vitals Group     BP 05/05/24 2036 (!) 140/92     Girls Systolic BP Percentile --      Girls Diastolic BP Percentile --      Boys Systolic BP Percentile --      Boys Diastolic BP Percentile --      Pulse Rate 05/05/24 2036 79     Resp 05/05/24 2036 (!) 23     Temp 05/05/24 2038 98.2 F (36.8 C)     Temp src --      SpO2 05/05/24 2036 91 %     Weight 05/05/24 2035 159 lb 6.3 oz (72.3 kg)     Height 05/05/24 2035 5' 10 (1.778 m)     Head Circumference --      Peak Flow --      Pain Score 05/05/24 2035 0     Pain Loc --      Pain Education --      Exclude from Growth Chart --     Most recent vital signs: Vitals:   05/05/24 2036 05/05/24 2038  BP: (!) 140/92   Pulse: 79   Resp: (!) 23   Temp:  98.2 F (36.8 C)  SpO2: 91%     Constitutional: Alert and oriented. Eyes: Conjunctivae are normal. Head: Atraumatic. Nose: No  congestion/rhinnorhea. Mouth/Throat: Mucous membranes are moist.  Cardiovascular: Normal rate, regular rhythm. Grossly normal heart sounds.  2+ radial pulses bilaterally. Respiratory: Tachypneic with increased work of breathing, expiratory wheezing throughout. Gastrointestinal: Soft and nontender. No distention. Musculoskeletal: No lower extremity tenderness nor edema.  Neurologic:  Normal speech and language. No gross focal neurologic deficits are appreciated.    ED Results / Procedures / Treatments   Labs (all labs ordered are listed, but only abnormal results are displayed) Labs Reviewed  CBC WITH DIFFERENTIAL/PLATELET - Abnormal; Notable for the following components:      Result Value   RBC 4.18 (*)    Hemoglobin 10.8 (*)    HCT 34.1 (*)    MCH 25.8 (*)    RDW 21.2 (*)    Neutro Abs 8.7 (*)    Lymphs Abs 0.5 (*)    Abs Immature Granulocytes 0.09 (*)    All other components within normal limits  BASIC METABOLIC PANEL WITH GFR - Abnormal; Notable for the following components:   Glucose, Bld 159 (*)    BUN 30 (*)    Calcium  8.4 (*)    All other components within normal limits  CULTURE, BLOOD (ROUTINE X 2)  CULTURE, BLOOD (ROUTINE X 2)  LACTIC ACID, PLASMA  LACTIC ACID, PLASMA  TROPONIN I (HIGH SENSITIVITY)     EKG  ED ECG REPORT I, Carlin Palin, the attending physician, personally viewed and interpreted this ECG.   Date: 05/05/2024  EKG Time: 20:36  Rate: 80  Rhythm: normal sinus rhythm  Axis: LAD  Intervals:left anterior fascicular block  ST&T Change: None  RADIOLOGY Chest x-ray reviewed and interpreted by me with right upper lobe cavitary pneumonia.  PROCEDURES:  Critical Care performed: No  Procedures   MEDICATIONS ORDERED IN ED: Medications  predniSONE  (DELTASONE ) tablet 60 mg (has no administration in time range)  ipratropium-albuterol  (DUONEB) 0.5-2.5 (3) MG/3ML nebulizer solution 3 mL (has no administration in time range)  cefTRIAXone   (ROCEPHIN ) 2 g in sodium chloride  0.9 % 100 mL IVPB (has no administration in time range)  azithromycin  (ZITHROMAX ) 500 mg in sodium chloride  0.9 % 250 mL IVPB (has no administration in time range)  vancomycin  (VANCOREADY) IVPB 1750 mg/350 mL (has no administration in time range)     IMPRESSION / MDM / ASSESSMENT AND PLAN / ED COURSE  I reviewed the triage vital signs and the nursing notes.                              78 y.o. male with past medical history of hypertension, stroke, COPD, lung cancer, and cocaine abuse who presents to the ED complaining of productive cough with increasing difficulty breathing over the past 3 days.  Patient's presentation is most consistent with acute presentation with potential threat to life or bodily function.  Differential diagnosis includes, but is not limited to, COPD exacerbation, CHF, pneumonia, ACS, PE, anemia, electrolyte abnormality, AKI.  Patient ill-appearing and in mild to moderate respiratory distress with increased work of breathing and significant wheezing.  EKG shows no evidence of arrhythmia or ischemia, will treat wheezing with prednisone  and DuoNeb.  Chest x-ray concerning for known lung mass with associated cavitary pneumonia, progressed compared to previous.  We will start on broad-spectrum antibiotics, sepsis workup also initiated.  Patient with stable anemia, no significant leukocytosis, electrolyte abnormality, or AKI.  Case discussed with hospitalist for admission.      FINAL CLINICAL IMPRESSION(S) / ED DIAGNOSES   Final diagnoses:  Cavitary pneumonia  COPD exacerbation (HCC)     Rx / DC Orders   ED Discharge Orders     None        Note:  This document was prepared using Dragon voice recognition software and may include unintentional dictation errors.   Palin Carlin, MD 05/05/24 2252

## 2024-05-06 DIAGNOSIS — J189 Pneumonia, unspecified organism: Secondary | ICD-10-CM | POA: Diagnosis not present

## 2024-05-06 DIAGNOSIS — N400 Enlarged prostate without lower urinary tract symptoms: Secondary | ICD-10-CM | POA: Insufficient documentation

## 2024-05-06 DIAGNOSIS — G629 Polyneuropathy, unspecified: Secondary | ICD-10-CM

## 2024-05-06 DIAGNOSIS — I10 Essential (primary) hypertension: Secondary | ICD-10-CM | POA: Diagnosis not present

## 2024-05-06 DIAGNOSIS — E785 Hyperlipidemia, unspecified: Secondary | ICD-10-CM

## 2024-05-06 DIAGNOSIS — Z8673 Personal history of transient ischemic attack (TIA), and cerebral infarction without residual deficits: Secondary | ICD-10-CM

## 2024-05-06 DIAGNOSIS — J441 Chronic obstructive pulmonary disease with (acute) exacerbation: Secondary | ICD-10-CM | POA: Diagnosis not present

## 2024-05-06 LAB — RESPIRATORY PANEL BY PCR

## 2024-05-06 LAB — BASIC METABOLIC PANEL WITH GFR
Anion gap: 6 (ref 5–15)
BUN: 26 mg/dL — ABNORMAL HIGH (ref 8–23)
CO2: 26 mmol/L (ref 22–32)
Calcium: 7.9 mg/dL — ABNORMAL LOW (ref 8.9–10.3)
Chloride: 108 mmol/L (ref 98–111)
Creatinine, Ser: 0.71 mg/dL (ref 0.61–1.24)
GFR, Estimated: >60 mL/min (ref >60)
Glucose, Bld: 103 mg/dL — ABNORMAL HIGH (ref 70–99)
Potassium: 4.1 mmol/L (ref 3.5–5.1)
Sodium: 140 mmol/L (ref 135–145)

## 2024-05-06 LAB — LACTIC ACID, PLASMA
Lactic Acid, Venous: 0.9 mmol/L (ref 0.5–1.9)
Lactic Acid, Venous: 1 mmol/L (ref 0.5–1.9)

## 2024-05-06 LAB — CBC
HCT: 30.2 % — ABNORMAL LOW (ref 39.0–52.0)
Hemoglobin: 9.6 g/dL — ABNORMAL LOW (ref 13.0–17.0)
MCH: 25.9 pg — ABNORMAL LOW (ref 26.0–34.0)
MCHC: 31.8 g/dL (ref 30.0–36.0)
MCV: 81.6 fL (ref 80.0–100.0)
Platelets: 76 K/uL — ABNORMAL LOW (ref 150–400)
RBC: 3.7 MIL/uL — ABNORMAL LOW (ref 4.22–5.81)
RDW: 21 % — ABNORMAL HIGH (ref 11.5–15.5)
WBC: 9.6 K/uL (ref 4.0–10.5)
nRBC: 0 % (ref 0.0–0.2)

## 2024-05-06 LAB — EXPECTORATED SPUTUM ASSESSMENT W GRAM STAIN, RFLX TO RESP C

## 2024-05-06 LAB — MRSA NEXT GEN BY PCR, NASAL: MRSA by PCR Next Gen: NOT DETECTED

## 2024-05-06 LAB — TROPONIN I (HIGH SENSITIVITY)
Troponin I (High Sensitivity): 17 ng/L (ref <18)
Troponin I (High Sensitivity): 17 ng/L (ref <18)

## 2024-05-06 LAB — STREP PNEUMONIAE URINARY ANTIGEN: Strep Pneumo Urinary Antigen: NEGATIVE

## 2024-05-06 MED ORDER — VANCOMYCIN HCL IN DEXTROSE 1-5 GM/200ML-% IV SOLN
1000.0000 mg | Freq: Two times a day (BID) | INTRAVENOUS | Status: DC
  Administered 2024-05-06 – 2024-05-07 (×2): 1000 mg via INTRAVENOUS
  Filled 2024-05-06 (×2): qty 200

## 2024-05-06 MED ORDER — SODIUM CHLORIDE 0.9 % IV SOLN
3.0000 g | Freq: Four times a day (QID) | INTRAVENOUS | Status: DC
  Administered 2024-05-06 – 2024-05-15 (×36): 3 g via INTRAVENOUS
  Filled 2024-05-06 (×39): qty 8

## 2024-05-06 MED ORDER — VANCOMYCIN HCL 1750 MG/350ML IV SOLN: 1750.0000 mg | INTRAVENOUS | Status: DC

## 2024-05-06 NOTE — Assessment & Plan Note (Signed)
 Will continue statin therapy

## 2024-05-06 NOTE — ED Notes (Addendum)
 This NT went in to pts rm to transport pt to next rm. Once this NT got all pts belongs onto the bed, he informed this NT that he had to pee. This NT gave pt a urinal and asked pt to stand next to bed to use urinal, pt verbally confirmed with this NT that he understood. Pt then walked over to toilet and ripped out his IV. Pt then began telling this NT  this is your fault, y'all need some better qualified help in this place. This NT covered bleeding area with gauze and tape. Pt yelled at this NT to get someone that is actually qualified for this job. This NT went out and grabbed [employer]. Logan RN helped this NT get items to clean pt. Pt was given water basin, wipes, wash cloth, and soap. Pt cleaned self with no help and is now in clean gown and socks. Pt is calm and corporative at this time.

## 2024-05-06 NOTE — Progress Notes (Signed)
 Pharmacy Antibiotic Note  Timothy Matthews is a 78 y.o. male admitted on 05/05/2024 with pneumonia.  Pharmacy has been consulted for Vancomycin  dosing.  Plan: Vancomycin  1750 mg IV Q 24 hrs. Goal AUC 400-550. Expected AUC: 493.4 SCr used: 0.85, TBW 72.3 kg < IBW 73 kg  Pharmacy will continue to follow and will adjust abx dosing whenever warranted.  Temp (24hrs), Avg:98.1 F (36.7 C), Min:98 F (36.7 C), Max:98.2 F (36.8 C)   Recent Labs  Lab 05/05/24 2045 05/05/24 2330 05/06/24 0213  WBC 9.5  --   --   CREATININE 0.85  --   --   LATICACIDVEN  --  1.0 0.9    Estimated Creatinine Clearance: 76.8 mL/min (by C-G formula based on SCr of 0.85 mg/dL).    Allergies  Allergen Reactions   Trospium     Other Reaction(s): Blurring of visual image    Antimicrobials this admission: 7/25 Azithromycin  >> x 5 days 7/25 Ceftriaxone  >> x 5 days 7/25 Vancomycin  >>  Microbiology results: 7/24 BCx: Pending 7/25 Sputum: Sent  Thank you for allowing pharmacy to be a part of this patient's care.  Rankin CANDIE Dills, PharmD, [facility] 05/06/2024 5:03 AM

## 2024-05-06 NOTE — Assessment & Plan Note (Addendum)
-   The patient will be admitted to a medical telemetry bed. - Will continue antibiotic therapy with IV Rocephin  and Zithromax . - Given its cavitary lesion we will add IV vancomycin . - Pulmonary consult will be obtained.  I notified Dr. Aleskerov about the patient. - Mucolytic therapy be provided as well as duo nebs q.i.d. and q.4 hours p.r.n. - We will follow blood cultures as well as obtain sputum Gram stain culture consistent and respiratory panel.

## 2024-05-06 NOTE — Plan of Care (Signed)

## 2024-05-06 NOTE — Hospital Course (Addendum)
 78 y.o. male with medical history significant for emphysema, cocaine abuse, GERD, hepatitis C, hypertension, prostate cancer, lung cancer,    PTSD and CVA, who presented to the emergency room with acute onset of worsening dyspnea with associated cough productive of thick whitish sputum as well as wheezing over the last week with no fever or chills.  Found to have exacerbation of his postobstructive pneumonia right upper lobe.   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  Complaining of worsening cough productive sputum on presentation.  Will transition ceftriaxone  to Unasyn .  Continue azithromycin .  MRSA swab negative so we will discontinue vancomycin .  Previous bronchoscopy and cultures were nonrevealing for specific bacterial pathogen or fungus.   Acute COPD exacerbation - Exacerbated by above.  Wheezing and shortness of breath.  Continue IV Solu-Medrol , nebulizers, mucolytic's, antibiotics as above.  Supplemental O2 as needed.   RUL lung adenocarcinoma - Reportedly is followed closely at the [facility].   History of CVA - Aspirin  tomorrow.   Peripheral neuropathy - Neurontin  on board.   BPH - Continue Flomax .   Noncompliance with medical regimen - History of leaving AMA.  [facility].  States he completed chemo/radiation.  Unable to verify at this time.

## 2024-05-06 NOTE — Progress Notes (Addendum)
 Progress Note   Patient: Timothy Matthews FMW:[id number] DOB: 04-18-1946 DOA: 05/05/2024  DOS: the patient was seen and examined on 05/06/2024   Brief hospital course:  78 y.o. male with medical history significant for emphysema, cocaine abuse, GERD, hepatitis C, hypertension, prostate cancer, lung cancer,    PTSD and CVA, who presented to the emergency room with acute onset of worsening dyspnea with associated cough productive of thick whitish sputum as well as wheezing over the last week with no fever or chills.  Found to have exacerbation of his postobstructive pneumonia right upper lobe.  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  Complaining of worsening cough productive sputum.  Initiated on empiric ceftriaxone  plus azithromycin  as well as IV vancomycin .  Previous bronchoscopy and cultures were nonrevealing for specific bacterial pathogen or fungus.  Acute COPD exacerbation - Exacerbated by above.  Wheezing and shortness of breath.  Initiated on IV Solu-Medrol , nebulizers, mucolytic's, antibiotics as above.  Supplemental O2 as needed.  RUL lung adenocarcinoma - Reportedly is followed closely at the [facility].  History of CVA - Aspirin  tomorrow.  Peripheral neuropathy - Neurontin  on board.  BPH - Continue Flomax .  Noncompliance with medical regimen - History of leaving AMA.  [facility].  States he completed chemo/radiation.  Unable to verify at this time.   Subjective: Patient resting comfortably's morning.  Has a productive cough but otherwise denies fever, chest pain, nausea, vomiting, abdominal pain.  Physical Exam:  Vitals:   05/06/24 0500 05/06/24 0530 05/06/24 0638 05/06/24 1110  BP: (!) 155/92 (!) 140/98  (!) 146/95  Pulse: 66 66  73  Resp: (!) 22 17  20   Temp:   98.1 F (36.7 C) 97.7 F (36.5 C)  TempSrc:    Oral  SpO2: 95% 94%  99%   Weight:      Height:        GENERAL:  Alert, pleasant, no acute distress, disheveled HEENT:  EOMI, nasal cannula CARDIOVASCULAR:  RRR, no murmurs appreciated RESPIRATORY: Right upper lobe rhonchi, expiratory wheezing, productive cough GASTROINTESTINAL:  Soft, nontender, nondistended EXTREMITIES:  No LE edema bilaterally NEURO:  No new focal deficits appreciated SKIN:  No rashes noted PSYCH:  Appropriate mood and affect     Data Reviewed:  Imaging Studies: DG Chest 2 View Result Date: 05/05/2024 EXAM: 2 VIEW(S) XRAY OF THE CHEST 05/05/2024 09:01:04 PM COMPARISON: 04/20/2024 CLINICAL HISTORY: SOB. PER ER NOTE; Pt reports shortness of breath that began a few months ago, pt reports he was seen for same earlier this month and left AMA. Pt has hx lung cancer, not currently receiving treatment. Pt also reports cough and congestion. FINDINGS: LUNGS AND PLEURA: Right upper lobe pneumonia, mildly progressive with increased cavitation. Underlying subpleural reticulation/fibrosis, reflecting chronic interstitial lung disease. No pleural effusion. No pneumothorax. HEART AND MEDIASTINUM: No acute abnormality of the cardiac and mediastinal silhouettes. BONES AND SOFT TISSUES: Cervicothoracic surgical hardware noted. No acute osseous abnormality. IMPRESSION: 1. Right upper lobe pneumonia, mildly progressive with increased cavitation. Electronically signed by: Pinkie Pebbles MD 05/05/2024 09:06 PM EDT RP Workstation: [id number]   DG Chest 2 View Result Date: 04/20/2024 CLINICAL DATA:  Shortness of breath and productive cough. EXAM: CHEST - 2 VIEW COMPARISON:  April 19, 2024 FINDINGS: The heart size and mediastinal contours are within normal limits. Low lung volumes are noted with mild, stable elevation of the right hemidiaphragm and stable diffusely prominent  interstitial lung markings. Stable airspace disease and adjacent area of cavitation is seen within the right upper lobe. Involvement of the adjacent  portions of the right middle lobe and right lower lobe are seen. There is a small area of right apical pleural fluid versus pleural thickening. No pneumothorax is identified. Postoperative changes are seen within the lower cervical and upper thoracic spine. The visualized skeletal structures are unremarkable. IMPRESSION: 1. Stable right upper lobe airspace disease and adjacent area of cavitation. 2. Small area of right apical pleural fluid versus pleural thickening. 3. Low lung volumes with mild, stable elevation of the right hemidiaphragm and diffusely prominent interstitial lung markings. Electronically Signed   By: [name] Dials M.D.   On: 04/20/2024 00:55   DG Chest Port 1 View Result Date: 04/19/2024 CLINICAL DATA:  [id number] SOB (shortness of breath) [id number] EXAM: PORTABLE CHEST 1 VIEW COMPARISON:  Chest x-ray 03/25/2024, CT angio chest 03/25/2024 FINDINGS: The heart and mediastinal contours are unchanged. Coarsened interstitial markings with improved aeration of the right apex but worsened airspace opacity of the inferior portion of the right upper lobe and superior portion of the left upper lobe. Central lucency within the right upper lobe suggestive of known cavitation. Question small loculated right apical pleural effusion. No pneumothorax. No acute osseous abnormality. IMPRESSION: 1. Improved aeration of the right apex but worsened airspace opacity of the inferior portion of the right upper lobe and superior portion of the left upper lobe. Central lucency within the right upper lobe suggestive of known cavitation. Question small loculated right apical pleural effusion. Concern for multifocal pneumonia with known underlying mass. Consider pulmonary consultation for consideration of further imaging such as PET CT. 2. Underlying interstitial lung disease. Electronically Signed   By: Morgane  Naveau M.D.   On: 04/19/2024 00:52    There are no new results to review at this time.  Previous records  (including but not limited to H&P, progress notes, nursing notes, TOC management) were reviewed in assessment of this patient.  Labs: CBC: Recent Labs  Lab 05/05/24 2045 05/06/24 0454  WBC 9.5 9.6  NEUTROABS 8.7*  --   HGB 10.8* 9.6*  HCT 34.1* 30.2*  MCV 81.6 81.6  PLT 216 76*   Basic Metabolic Panel: Recent Labs  Lab 05/05/24 2045 05/06/24 0454  NA 138 140  K 4.0 4.1  CL 107 108  CO2 24 26  GLUCOSE 159* 103*  BUN 30* 26*  CREATININE 0.85 0.71  CALCIUM  8.4* 7.9*   Liver Function Tests: No results for input(s): AST, ALT, ALKPHOS, BILITOT, PROT, ALBUMIN in the last 168 hours. CBG: No results for input(s): GLUCAP in the last 168 hours.  Scheduled Meds:  atorvastatin   40 mg Oral Daily   enoxaparin  (LOVENOX ) injection  40 mg Subcutaneous Q24H   gabapentin   600 mg Oral TID   guaiFENesin   600 mg Oral BID   ipratropium-albuterol   3 mL Nebulization QID   methylPREDNISolone  (SOLU-MEDROL ) injection  40 mg Intravenous Q12H   Followed by   NOREEN ON 05/07/2024] predniSONE   40 mg Oral Q breakfast   Continuous Infusions:  sodium chloride  100 mL/hr at 05/06/24 0033   azithromycin      cefTRIAXone  (ROCEPHIN )  IV     vancomycin      PRN Meds:.acetaminophen  **OR** acetaminophen , chlorpheniramine-HYDROcodone , ipratropium-albuterol , magnesium  hydroxide, melatonin, ondansetron  **OR** ondansetron  (ZOFRAN ) IV, traZODone   Family Communication: None at bedside  Disposition: Status is: Inpatient Remains inpatient appropriate because: Postobstructive pneumonia     Time spent: 41 minutes  Length of inpatient stay: 1 days  Author: Carliss LELON Canales, DO 05/06/2024 12:23 PM  For on call review [email or web address].

## 2024-05-06 NOTE — Assessment & Plan Note (Signed)
-  Will continue aspirin .

## 2024-05-06 NOTE — H&P (Signed)
 [place]   PATIENT NAME: Timothy Matthews    MR#:  [id number]  DATE OF BIRTH:  [birth year]  DATE OF ADMISSION:  05/05/2024  PRIMARY CARE PHYSICIAN: Dorise Sonny Bradley, MD   Patient is coming from: Home  REQUESTING/REFERRING PHYSICIAN: Willo Dunnings, MD  CHIEF COMPLAINT:   Chief Complaint  Patient presents with   Shortness of Breath    HISTORY OF PRESENT ILLNESS:  Ramere E Bechler is a 78 y.o. male with medical history significant for emphysema, cocaine abuse, GERD, hepatitis C, hypertension, prostate cancer, lung cancer,    PTSD and CVA, who presented to the emergency room with acute onset of worsening dyspnea with associated cough productive of thick whitish sputum as well as wheezing over the last week with no fever or chills.  No night sweats.  He admitted to chest pain with cough.  He has been having urinary frequency without urgency or dysuria or hematuria or flank pain.  No nausea or vomiting or abdominal pain.  He was just admitted on 04/20/2024 for COPD exacerbation and left AGAINST MEDICAL ADVICE on the same day.  ED Course: The patient came to the ER, BP was 161/104 with otherwise normal vital signs.  Labs revealed BUN 30, calcium  8.4 blood glucose of 159 with otherwise normal BMP.  Labs revealed CBC with hemoglobin 10.8 hematocrit 37.1.  Lactic acid was nonelevated at 0.9.  Blood cultures were sent.    EKG as reviewed by me : EKG showed EKG showed normal sinus rhythm with a rate of 80 with left anterior fascicular block and flattened T waves laterally. Imaging: 2 view chest x-ray showed right upper lobe pneumonia, mildly progressive with increased cavitation.  The patient was given 60 mg of p.o. prednisone , DuoNeb, IV vancomycin , Rocephin  and Zithromax . PAST MEDICAL HISTORY:   Past Medical History:  Diagnosis Date   Cancer (HCC)    Cocaine abuse (HCC)    last ED encounter 04/07/20   Emphysema lung (HCC)    pan-lobar by CT 2017   GERD (gastroesophageal reflux  disease)    Hepatitis C    Hypertension    Prostate cancer (HCC) BPH   PTSD (post-traumatic stress disorder)   -CVA  PAST SURGICAL HISTORY:   Past Surgical History:  Procedure Laterality Date   CERVICAL SPINE SURGERY  2017   Done at [facility] SPINE SURGERY     TEE WITHOUT CARDIOVERSION N/A 10/26/2020   Procedure: TRANSESOPHAGEAL ECHOCARDIOGRAM (TEE);  Surgeon: Bosie Vinie LABOR, MD;  Location: [facility] ORS;  Service: Cardiovascular;  Laterality: N/A;   TONSILLECTOMY     TRANSURETHRAL RESECTION OF PROSTATE      SOCIAL HISTORY:   Social History   Tobacco Use   Smoking status: Every Day    Types: Cigarettes   Smokeless tobacco: Never  Substance Use Topics   Alcohol  use: Not Currently    FAMILY HISTORY:   Family History  Problem Relation Age of Onset   Breast cancer Mother    Diabetes Mother    Glaucoma Mother    Cancer Father    Alcohol  abuse Father    Pancreatic cancer Father     DRUG ALLERGIES:   Allergies  Allergen Reactions   Trospium     Other Reaction(s): Blurring of visual image    REVIEW OF SYSTEMS:   ROS As per history of present illness. All pertinent systems were reviewed above. Constitutional, HEENT, cardiovascular, respiratory, GI, GU, musculoskeletal, neuro, psychiatric, endocrine, integumentary and hematologic systems  were reviewed and are otherwise negative/unremarkable except for positive findings mentioned above in the HPI.   MEDICATIONS AT HOME:   Prior to Admission medications   Medication Sig Start Date End Date Taking? Authorizing Provider  atorvastatin  (LIPITOR) 40 MG tablet Take 1 tablet (40 mg total) by mouth daily. 10/30/20 05/06/24 Yes Leotis Bogus, MD  azelastine (ASTELIN) 0.1 % nasal spray Place 2 sprays into both nostrils 2 (two) times daily as needed (runny nose/pnd). 06/29/23  Yes [provider]  Baclofen 5 MG TABS Take 5 mg by mouth in the morning, at noon, and at bedtime. 04/10/24  Yes [provider]   benzonatate (TESSALON) 100 MG capsule Take 100 mg by mouth 3 (three) times daily as needed. 04/23/24  Yes [provider]  budesonide -formoterol  (SYMBICORT ) 80-4.5 MCG/ACT inhaler Inhale 2 puffs into the lungs 2 (two) times daily. 04/19/24 06/18/24 Yes Viviann Pastor, MD  fluticasone [facility]) 50 MCG/ACT nasal spray Place 2 sprays into both nostrils at bedtime. 06/29/23  Yes [provider]  gabapentin  (NEURONTIN ) 300 MG capsule Take 600 mg by mouth 3 (three) times daily.   Yes [provider]  guaiFENesin  (ROBITUSSIN) 100 MG/5ML liquid Take 5 mLs by mouth 4 (four) times daily as needed for cough or to loosen phlegm. Take 1 teaspoonful by mouth four times a day as needed for cough and congestion 06/29/23  Yes [provider]  ipratropium-albuterol  (DUONEB) 0.5-2.5 (3) MG/3ML SOLN Inhale 3 mLs into the lungs every 4 (four) hours as needed. 04/22/24  Yes [provider]  melatonin 3 MG TABS tablet Take 3-6 mg by mouth at bedtime as needed (sleep). Take one capsule/tablet by mouth at bedtime as needed. Okay to increase to 6mg  (2caps/tabs) 05/06/23  Yes [provider]  tamsulosin  (FLOMAX ) 0.4 MG CAPS capsule Take 0.4 mg by mouth at bedtime.   Yes [provider]  acetaminophen  (TYLENOL ) 325 MG tablet Take 650 mg by mouth every 8 (eight) hours as needed for mild pain (pain score 1-3) (dental pain). 06/29/23   [provider]  albuterol  (PROVENTIL  HFA) 108 (90 Base) MCG/ACT inhaler Inhale 2 puffs into the lungs every 4 (four) hours as needed for wheezing or shortness of breath. 04/19/24   Viviann Pastor, MD  aspirin  EC 81 MG EC tablet Take 1 tablet (81 mg total) by mouth daily. Swallow whole. Patient not taking: Reported on 03/25/2024 10/30/20   Leotis Bogus, MD  brompheniramine-pseudoephedrine-DM 30-2-10 MG/5ML syrup Take 5 mLs by mouth 4 (four) times daily as needed. 02/08/24   Evans, Alexandra, PA-C  HYDROPHILIC EX Apply 1 Application  topically daily as needed (dry skin). Patient not taking: Reported on 05/06/2024 05/06/23   [provider]  nicotine polacrilex (COMMIT) 2 MG lozenge Take 2 mg by mouth as needed for smoking cessation. DISSOLVE 1 LOZENGE BY MOUTH AS DIRECTED FOR SMOKING CESSATION USE AT LEAST 8-10 LOZENGES EACH DAY TO START. DO NOT USE MORE THAN 20 LOZENGES IN A DAY. DO NOT EAT OR DRINK FOR 15 MINUTES BEFORE AND DURING USE Patient not taking: Reported on 04/20/2024 05/06/23   [provider]  ondansetron  (ZOFRAN ) 8 MG tablet Take 8 mg by mouth every 8 (eight) hours as needed for nausea or vomiting. Patient not taking: Reported on 04/20/2024 10/26/23   [provider]  predniSONE  (DELTASONE ) 10 MG tablet Take 5-60 mg by mouth daily with breakfast. Decrease by one-half tablet daily until complete    [provider]  prochlorperazine (COMPAZINE) 5 MG tablet  Take 10 mg by mouth every 6 (six) hours as needed for nausea or vomiting. Patient not taking: Reported on 04/20/2024 10/26/23   [provider]  Spacer/Aero-Holding Chambers (AEROCHAMBER PLUS) Device Use as instructed 04/19/24   Viviann Pastor, MD  terazosin (HYTRIN) 10 MG capsule Take 20 mg by mouth at bedtime. Patient not taking: Reported on 04/20/2024 12/26/08   [provider]  traZODone  (DESYREL ) 50 MG tablet Take 50 mg by mouth at bedtime. Patient not taking: Reported on 04/20/2024 11/06/23   [provider]      VITAL SIGNS:  Blood pressure (!) 159/95, pulse 86, temperature 98 F (36.7 C), resp. rate (!) 21, height 5' 10 (1.778 m), weight 72.3 kg, SpO2 98%.  PHYSICAL EXAMINATION:  Physical Exam  GENERAL:  78 y.o.-year-old African-American male patient lying in the bed with no acute distress.  EYES: Pupils equal, round, reactive to light and accommodation. No scleral icterus. Extraocular muscles intact.  HEENT: Head atraumatic, normocephalic. Oropharynx and nasopharynx clear.  NECK:  Supple, no jugular  venous distention. No thyroid  enlargement, no tenderness.  LUNGS: Diminished bibasilar breath sounds with diffuse expiratory wheezes and tight expiratory airflow with harsh vesicular breathing and diminished left upper lung zone breath sounds with positive bronchophony.  No use of accessory muscles of respiration.  CARDIOVASCULAR: Regular rate and rhythm, S1, S2 normal. No murmurs, rubs, or gallops.  ABDOMEN: Soft, nondistended, nontender. Bowel sounds present. No organomegaly or mass.  EXTREMITIES: No pedal edema, cyanosis, or clubbing.  NEUROLOGIC: Cranial nerves II through XII are intact. Muscle strength 5/5 in all extremities. Sensation intact. Gait not checked.  PSYCHIATRIC: The patient is alert and oriented x 3.  Normal affect and good eye contact. SKIN: No obvious rash, lesion, or ulcer.   LABORATORY PANEL:   CBC Recent Labs  Lab 05/05/24 2045  WBC 9.5  HGB 10.8*  HCT 34.1*  PLT 216   ------------------------------------------------------------------------------------------------------------------  Chemistries  Recent Labs  Lab 05/05/24 2045  NA 138  K 4.0  CL 107  CO2 24  GLUCOSE 159*  BUN 30*  CREATININE 0.85  CALCIUM  8.4*   ------------------------------------------------------------------------------------------------------------------  Cardiac Enzymes No results for input(s): TROPONINI in the last 168 hours. ------------------------------------------------------------------------------------------------------------------  RADIOLOGY:  DG Chest 2 View Result Date: 05/05/2024 EXAM: 2 VIEW(S) XRAY OF THE CHEST 05/05/2024 09:01:04 PM COMPARISON: 04/20/2024 CLINICAL HISTORY: SOB. PER ER NOTE; Pt reports shortness of breath that began a few months ago, pt reports he was seen for same earlier this month and left AMA. Pt has hx lung cancer, not currently receiving treatment. Pt also reports cough and congestion. FINDINGS: LUNGS AND PLEURA: Right upper lobe pneumonia,  mildly progressive with increased cavitation. Underlying subpleural reticulation/fibrosis, reflecting chronic interstitial lung disease. No pleural effusion. No pneumothorax. HEART AND MEDIASTINUM: No acute abnormality of the cardiac and mediastinal silhouettes. BONES AND SOFT TISSUES: Cervicothoracic surgical hardware noted. No acute osseous abnormality. IMPRESSION: 1. Right upper lobe pneumonia, mildly progressive with increased cavitation. Electronically signed by: Pinkie Pebbles MD 05/05/2024 09:06 PM EDT RP Workstation: [id number]      IMPRESSION AND PLAN:  Assessment and Plan: * Cavitary pneumonia - The patient will be admitted to a medical telemetry bed. - Will continue antibiotic therapy with IV Rocephin  and Zithromax . - Given its cavitary lesion we will add IV vancomycin . - Pulmonary consult will be obtained.  I notified Dr. Aleskerov about the patient. - Mucolytic therapy be provided as well as duo nebs q.i.d. and q.4 hours p.r.n. - We will follow  blood cultures as well as obtain sputum Gram stain culture consistent and respiratory panel.  COPD with acute exacerbation (HCC) - We will place the patient IV steroid therapy with IV Solu-Medrol  as well as nebulized bronchodilator therapy with duonebs q.i.d. and q.4 hours p.r.n.SABRA - Mucolytic therapy will be provided with Mucinex  and antibiotic therapy with IV Rocephin  and Zithromax . - O2 protocol will be followed. - Will hold off long-acting beta agonist.   Dyslipidemia - Will continue statin therapy.  Essential hypertension - Will continue antihypertensive therapy  History of CVA (cerebrovascular accident) -Will continue aspirin .  Peripheral neuropathy - Will continue Neurontin .  BPH (benign prostatic hyperplasia) - Will continue Flomax .   DVT prophylaxis: Lovenox . Advanced Care Planning:  Code Status: full code. Family Communication:  The plan of care was discussed in details with the patient (and family). I answered  all questions. The patient agreed to proceed with the above mentioned plan. Further management will depend upon hospital course. Disposition Plan: Back to previous home environment Consults called: none.  All the records are reviewed and case discussed with ED provider.  Status is: Inpatient  At the time of the admission, it appears that the appropriate admission status for this patient is inpatient.  This is judged to be reasonable and necessary in order to provide the required intensity of service to ensure the patient's safety given the presenting symptoms, physical exam findings and initial radiographic and laboratory data in the context of comorbid conditions.  The patient requires inpatient status due to high intensity of service, high risk of further deterioration and high frequency of surveillance required.  I certify that at the time of admission, it is my clinical judgment that the patient will require inpatient hospital care extending more than 2 midnights.                            Dispo: The patient is from: Home              Anticipated d/c is to: Home              Patient currently is not medically stable to d/c.              Difficult to place patient: No  Madison DELENA Peaches M.D on 05/06/2024 at 4:52 AM  [facility]   From 7 PM-7 AM, contact night-coverage [email or web address]  CC: Primary care physician; Dorise Sonny Bradley, MD

## 2024-05-06 NOTE — Assessment & Plan Note (Signed)
Will continue antihypertensive therapy.

## 2024-05-06 NOTE — Assessment & Plan Note (Addendum)
-   We will place the patient IV steroid therapy with IV Solu-Medrol  as well as nebulized bronchodilator therapy with duonebs q.i.d. and q.4 hours p.r.n.SABRA - Mucolytic therapy will be provided with Mucinex  and antibiotic therapy with IV Rocephin  and Zithromax . - O2 protocol will be followed. - Will hold off long-acting beta agonist.

## 2024-05-06 NOTE — ED Notes (Signed)
 Pt had an accident on himself while trying to use the urinal. This nt helped pt get into a new gown, socks, and underwear. Pt placed back on the monitor. Call light in reach

## 2024-05-06 NOTE — Assessment & Plan Note (Signed)
Will continue Flomax.

## 2024-05-06 NOTE — Assessment & Plan Note (Signed)
Will continue Neurontin.

## 2024-05-06 NOTE — Progress Notes (Signed)
 Pharmacy Antibiotic Note  Timothy Matthews is a 78 y.o. male admitted on 05/05/2024 with pneumonia.  Pharmacy has been consulted for Vancomycin  dosing.  Plan: Change vancomycin  to 1000 mg IV every 12 hours Estimated AUC 532, Cmin 15 Wt = 72.3 kg, Scr rounded to 0.8, Vd 0.72 Vancomycin  levels at steady state or as clinically indicated Ceftriaxone  2 grams IV every 24 hours per provider Azithromycin  500 mg IV every 24 hours per provider Follow cultures and renal function for adjustments  Pharmacy will continue to follow and will adjust abx dosing whenever warranted.  Temp (24hrs), Avg:98.1 F (36.7 C), Min:98 F (36.7 C), Max:98.2 F (36.8 C)   Recent Labs  Lab 05/05/24 2045 05/05/24 2330 05/06/24 0213 05/06/24 0454  WBC 9.5  --   --  9.6  CREATININE 0.85  --   --  0.71  LATICACIDVEN  --  1.0 0.9  --     Estimated Creatinine Clearance: 81.6 mL/min (by C-G formula based on SCr of 0.71 mg/dL).    Allergies  Allergen Reactions   Trospium     Other Reaction(s): Blurring of visual image    Antimicrobials this admission: 7/25 Azithromycin  >> x 5 days 7/25 Ceftriaxone  >> x 5 days 7/25 Vancomycin  >>  Microbiology results: 7/24 BCx: Pending 7/25 Sputum: Sent 7/25 MRSA screen ordered  Thank you for allowing pharmacy to be a part of this patient's care.  Kayla JULIANNA Blew, PharmD, BCPS 05/06/2024 8:27 AM

## 2024-05-07 DIAGNOSIS — J189 Pneumonia, unspecified organism: Secondary | ICD-10-CM | POA: Diagnosis not present

## 2024-05-07 DIAGNOSIS — N400 Enlarged prostate without lower urinary tract symptoms: Secondary | ICD-10-CM

## 2024-05-07 DIAGNOSIS — J441 Chronic obstructive pulmonary disease with (acute) exacerbation: Secondary | ICD-10-CM | POA: Diagnosis not present

## 2024-05-07 DIAGNOSIS — J984 Other disorders of lung: Secondary | ICD-10-CM | POA: Diagnosis not present

## 2024-05-07 LAB — BASIC METABOLIC PANEL WITH GFR
Anion gap: 6 (ref 5–15)
BUN: 27 mg/dL — ABNORMAL HIGH (ref 8–23)
CO2: 22 mmol/L (ref 22–32)
Calcium: 8.3 mg/dL — ABNORMAL LOW (ref 8.9–10.3)
Chloride: 105 mmol/L (ref 98–111)
Creatinine, Ser: 0.98 mg/dL (ref 0.61–1.24)
GFR, Estimated: >60 mL/min (ref >60)
Glucose, Bld: 159 mg/dL — ABNORMAL HIGH (ref 70–99)
Potassium: 4 mmol/L (ref 3.5–5.1)
Sodium: 134 mmol/L — ABNORMAL LOW (ref 135–145)

## 2024-05-07 LAB — LEGIONELLA PNEUMOPHILA SEROGP 1 UR AG: L. pneumophila Serogp 1 Ur Ag: NEGATIVE

## 2024-05-07 LAB — CBC
HCT: 31.6 % — ABNORMAL LOW (ref 39.0–52.0)
Hemoglobin: 10 g/dL — ABNORMAL LOW (ref 13.0–17.0)
MCH: 25.6 pg — ABNORMAL LOW (ref 26.0–34.0)
MCHC: 31.6 g/dL (ref 30.0–36.0)
MCV: 81 fL (ref 80.0–100.0)
Platelets: 149 K/uL — ABNORMAL LOW (ref 150–400)
RBC: 3.9 MIL/uL — ABNORMAL LOW (ref 4.22–5.81)
RDW: 20.7 % — ABNORMAL HIGH (ref 11.5–15.5)
WBC: 8.8 K/uL (ref 4.0–10.5)
nRBC: 0 % (ref 0.0–0.2)

## 2024-05-07 LAB — MAGNESIUM: Magnesium: 1.8 mg/dL (ref 1.7–2.4)

## 2024-05-07 MED ORDER — IPRATROPIUM-ALBUTEROL 0.5-2.5 (3) MG/3ML IN SOLN
3.0000 mL | Freq: Three times a day (TID) | RESPIRATORY_TRACT | Status: DC
  Administered 2024-05-07 – 2024-05-09 (×4): 3 mL via RESPIRATORY_TRACT
  Filled 2024-05-07 (×6): qty 3

## 2024-05-07 NOTE — Progress Notes (Signed)
 Progress Note   Patient: Timothy Matthews FMW:[id number] DOB: [birth year] DOA: 05/05/2024  DOS: the patient was seen and examined on 05/07/2024   Brief hospital course:  78 y.o. male with medical history significant for emphysema, cocaine abuse, GERD, hepatitis C, hypertension, prostate cancer, lung cancer,    PTSD and CVA, who presented to the emergency room with acute onset of worsening dyspnea with associated cough productive of thick whitish sputum as well as wheezing over the last week with no fever or chills.  Found to have exacerbation of his postobstructive pneumonia right upper lobe.  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  Complaining of worsening cough productive sputum on presentation.  Will transition ceftriaxone  to Unasyn .  Continue azithromycin .  MRSA swab negative so we will discontinue vancomycin .  Previous bronchoscopy and cultures were nonrevealing for specific bacterial pathogen or fungus.   Acute COPD exacerbation - Exacerbated by above.  Wheezing and shortness of breath.  Continue IV Solu-Medrol , nebulizers, mucolytic's, antibiotics as above.  Supplemental O2 as needed.   RUL lung adenocarcinoma - Reportedly is followed closely at the [place].   History of CVA - Aspirin  tomorrow.   Peripheral neuropathy - Neurontin  on board.   BPH - Continue Flomax .   Noncompliance with medical regimen - History of leaving AMA.  [facility].  States he completed chemo/radiation.  Unable to verify at this time.   Subjective: Patient sitting in bedside chair.  States he feels a bit better this morning.  Still having some coughing and shortness of breath.  Feels that he is not getting air into the right side of his lungs.  Explained about his known lung mass and propensity for postobstructive pneumonias.  Patient understanding.  Denies  any fever, chills, chest pain, nausea, vomiting, abdominal pain.  Physical Exam:  Vitals:   05/07/24 0501 05/07/24 0712 05/07/24 0857 05/07/24 1140  BP: (!) 156/90  (!) 148/99 (!) 150/87  Pulse: 66  78 78  Resp: 16  18 19   Temp: (!) 97.5 F (36.4 C)  98.5 F (36.9 C) 97.9 F (36.6 C)  TempSrc:      SpO2: 100% 97% 93% 94%  Weight:      Height:        GENERAL:  Alert, pleasant, no acute distress, disheveled HEENT:  EOMI, nasal cannula CARDIOVASCULAR:  RRR, no murmurs appreciated RESPIRATORY: Trace RUL expiratory wheezing, dry cough GASTROINTESTINAL:  Soft, nontender, nondistended EXTREMITIES:  No LE edema bilaterally NEURO:  No new focal deficits appreciated SKIN:  No rashes noted PSYCH:  Appropriate mood and affect    Data Reviewed:  Imaging Studies: DG Chest 2 View Result Date: 05/05/2024 EXAM: 2 VIEW(S) XRAY OF THE CHEST 05/05/2024 09:01:04 PM COMPARISON: 04/20/2024 CLINICAL HISTORY: SOB. PER ER NOTE; Pt reports shortness of breath that began a few months ago, pt reports he was seen for same earlier this month and left AMA. Pt has hx lung cancer, not currently receiving treatment. Pt also reports cough and congestion. FINDINGS: LUNGS AND PLEURA: Right upper lobe pneumonia, mildly progressive with increased cavitation. Underlying subpleural reticulation/fibrosis, reflecting chronic interstitial lung disease. No pleural effusion. No pneumothorax. HEART AND MEDIASTINUM: No acute abnormality of the cardiac and mediastinal silhouettes. BONES AND SOFT TISSUES: Cervicothoracic surgical hardware noted. No acute osseous abnormality. IMPRESSION: 1. Right upper lobe pneumonia, mildly progressive with increased cavitation. Electronically signed by: Pinkie Pebbles MD  05/05/2024 09:06 PM EDT RP Workstation: [id number]   DG Chest 2 View Result Date: 04/20/2024 CLINICAL DATA:  Shortness of breath and productive cough. EXAM: CHEST - 2 VIEW COMPARISON:  April 19, 2024 FINDINGS: The heart size and  mediastinal contours are within normal limits. Low lung volumes are noted with mild, stable elevation of the right hemidiaphragm and stable diffusely prominent interstitial lung markings. Stable airspace disease and adjacent area of cavitation is seen within the right upper lobe. Involvement of the adjacent portions of the right middle lobe and right lower lobe are seen. There is a small area of right apical pleural fluid versus pleural thickening. No pneumothorax is identified. Postoperative changes are seen within the lower cervical and upper thoracic spine. The visualized skeletal structures are unremarkable. IMPRESSION: 1. Stable right upper lobe airspace disease and adjacent area of cavitation. 2. Small area of right apical pleural fluid versus pleural thickening. 3. Low lung volumes with mild, stable elevation of the right hemidiaphragm and diffusely prominent interstitial lung markings. Electronically Signed   By: [name] Dials M.D.   On: 04/20/2024 00:55   DG Chest Port 1 View Result Date: 04/19/2024 CLINICAL DATA:  [id number] SOB (shortness of breath) [id number] EXAM: PORTABLE CHEST 1 VIEW COMPARISON:  Chest x-ray 03/25/2024, CT angio chest 03/25/2024 FINDINGS: The heart and mediastinal contours are unchanged. Coarsened interstitial markings with improved aeration of the right apex but worsened airspace opacity of the inferior portion of the right upper lobe and superior portion of the left upper lobe. Central lucency within the right upper lobe suggestive of known cavitation. Question small loculated right apical pleural effusion. No pneumothorax. No acute osseous abnormality. IMPRESSION: 1. Improved aeration of the right apex but worsened airspace opacity of the inferior portion of the right upper lobe and superior portion of the left upper lobe. Central lucency within the right upper lobe suggestive of known cavitation. Question small loculated right apical pleural effusion. Concern for multifocal  pneumonia with known underlying mass. Consider pulmonary consultation for consideration of further imaging such as PET CT. 2. Underlying interstitial lung disease. Electronically Signed   By: Morgane  Naveau M.D.   On: 04/19/2024 00:52    There are no new results to review at this time.  Previous records (including but not limited to H&P, progress notes, nursing notes, TOC management) were reviewed in assessment of this patient.  Labs: CBC: Recent Labs  Lab 05/05/24 2045 05/06/24 0454 05/07/24 0337  WBC 9.5 9.6 8.8  NEUTROABS 8.7*  --   --   HGB 10.8* 9.6* 10.0*  HCT 34.1* 30.2* 31.6*  MCV 81.6 81.6 81.0  PLT 216 76* 149*   Basic Metabolic Panel: Recent Labs  Lab 05/05/24 2045 05/06/24 0454 05/07/24 0337  NA 138 140 134*  K 4.0 4.1 4.0  CL 107 108 105  CO2 24 26 22   GLUCOSE 159* 103* 159*  BUN 30* 26* 27*  CREATININE 0.85 0.71 0.98  CALCIUM  8.4* 7.9* 8.3*  MG  --   --  1.8   Liver Function Tests: No results for input(s): AST, ALT, ALKPHOS, BILITOT, PROT, ALBUMIN in the last 168 hours. CBG: No results for input(s): GLUCAP in the last 168 hours.  Scheduled Meds:  atorvastatin   40 mg Oral Daily   enoxaparin  (LOVENOX ) injection  40 mg Subcutaneous Q24H   gabapentin   600 mg Oral TID   guaiFENesin   600 mg Oral BID   ipratropium-albuterol   3 mL Nebulization TID   predniSONE   40 mg Oral  Q breakfast   Continuous Infusions:  ampicillin -sulbactam (UNASYN ) IV 3 g (05/07/24 1301)   azithromycin  500 mg (05/07/24 0313)   PRN Meds:.acetaminophen  **OR** acetaminophen , chlorpheniramine-HYDROcodone , ipratropium-albuterol , magnesium  hydroxide, melatonin, ondansetron  **OR** ondansetron  (ZOFRAN ) IV, traZODone   Family Communication: Mom at bedside  Disposition: Status is: Inpatient Remains inpatient appropriate because: COPD exacerbation with cavitary postobstructive pneumonia     Time spent: 38 minutes  Length of inpatient stay: 2 days  Author: Carliss LELON Canales, DO 05/07/2024 1:03 PM  For on call review [email or web address].

## 2024-05-07 NOTE — Plan of Care (Signed)
  Problem: Education: Goal: Knowledge of General Education information will improve Description: Including pain rating scale, medication(s)/side effects and non-pharmacologic comfort measures Outcome: Progressing   Problem: Health Behavior/Discharge Planning: Goal: Ability to manage health-related needs will improve Outcome: Progressing   Problem: Clinical Measurements: Goal: Ability to maintain clinical measurements within normal limits will improve Outcome: Progressing Goal: Will remain free from infection Outcome: Progressing Goal: Diagnostic test results will improve Outcome: Progressing Goal: Respiratory complications will improve Outcome: Progressing Goal: Cardiovascular complication will be avoided Outcome: Progressing   Problem: Nutrition: Goal: Adequate nutrition will be maintained Outcome: Progressing   Problem: Coping: Goal: Level of anxiety will decrease Outcome: Progressing   Problem: Education: Goal: Knowledge of disease or condition will improve Outcome: Progressing Goal: Knowledge of the prescribed therapeutic regimen will improve Outcome: Progressing Goal: Individualized Educational Video(s) Outcome: Progressing   Problem: Respiratory: Goal: Ability to maintain a clear airway will improve Outcome: Progressing Goal: Levels of oxygenation will improve Outcome: Progressing Goal: Ability to maintain adequate ventilation will improve Outcome: Progressing   Problem: Respiratory: Goal: Ability to maintain adequate ventilation will improve Outcome: Progressing Goal: Ability to maintain a clear airway will improve Outcome: Progressing

## 2024-05-08 ENCOUNTER — Inpatient Hospital Stay

## 2024-05-08 DIAGNOSIS — J984 Other disorders of lung: Secondary | ICD-10-CM | POA: Diagnosis not present

## 2024-05-08 DIAGNOSIS — I10 Essential (primary) hypertension: Secondary | ICD-10-CM | POA: Diagnosis not present

## 2024-05-08 DIAGNOSIS — J441 Chronic obstructive pulmonary disease with (acute) exacerbation: Secondary | ICD-10-CM | POA: Diagnosis not present

## 2024-05-08 DIAGNOSIS — J189 Pneumonia, unspecified organism: Secondary | ICD-10-CM | POA: Diagnosis not present

## 2024-05-08 LAB — CBC
HCT: 33.9 % — ABNORMAL LOW (ref 39.0–52.0)
Hemoglobin: 10.6 g/dL — ABNORMAL LOW (ref 13.0–17.0)
MCH: 25.5 pg — ABNORMAL LOW (ref 26.0–34.0)
MCHC: 31.3 g/dL (ref 30.0–36.0)
MCV: 81.5 fL (ref 80.0–100.0)
Platelets: 198 K/uL (ref 150–400)
RBC: 4.16 MIL/uL — ABNORMAL LOW (ref 4.22–5.81)
RDW: 20.9 % — ABNORMAL HIGH (ref 11.5–15.5)
WBC: 12 K/uL — ABNORMAL HIGH (ref 4.0–10.5)
nRBC: 0 % (ref 0.0–0.2)

## 2024-05-08 LAB — BASIC METABOLIC PANEL WITH GFR
Anion gap: 5 (ref 5–15)
BUN: 30 mg/dL — ABNORMAL HIGH (ref 8–23)
CO2: 24 mmol/L (ref 22–32)
Calcium: 8.4 mg/dL — ABNORMAL LOW (ref 8.9–10.3)
Chloride: 106 mmol/L (ref 98–111)
Creatinine, Ser: 0.76 mg/dL (ref 0.61–1.24)
GFR, Estimated: >60 mL/min (ref >60)
Glucose, Bld: 101 mg/dL — ABNORMAL HIGH (ref 70–99)
Potassium: 3.7 mmol/L (ref 3.5–5.1)
Sodium: 135 mmol/L (ref 135–145)

## 2024-05-08 MED ORDER — BENZONATATE 100 MG PO CAPS
100.0000 mg | ORAL_CAPSULE | Freq: Two times a day (BID) | ORAL | Status: DC
  Administered 2024-05-08 – 2024-05-20 (×23): 100 mg via ORAL
  Filled 2024-05-08 (×25): qty 1

## 2024-05-08 MED ORDER — IOHEXOL 300 MG/ML  SOLN
75.0000 mL | Freq: Once | INTRAMUSCULAR | Status: AC | PRN
  Administered 2024-05-08: 75 mL via INTRAVENOUS

## 2024-05-08 MED ORDER — TAMSULOSIN HCL 0.4 MG PO CAPS
0.4000 mg | ORAL_CAPSULE | Freq: Every day | ORAL | Status: DC
  Administered 2024-05-08 – 2024-05-19 (×11): 0.4 mg via ORAL
  Filled 2024-05-08 (×11): qty 1

## 2024-05-08 MED ORDER — BACLOFEN 10 MG PO TABS
5.0000 mg | ORAL_TABLET | Freq: Two times a day (BID) | ORAL | Status: DC
  Administered 2024-05-08 – 2024-05-20 (×23): 5 mg via ORAL
  Filled 2024-05-08 (×25): qty 1

## 2024-05-08 NOTE — Plan of Care (Signed)
  Problem: Education: Goal: Knowledge of General Education information will improve Description: Including pain rating scale, medication(s)/side effects and non-pharmacologic comfort measures Outcome: Progressing   Problem: Health Behavior/Discharge Planning: Goal: Ability to manage health-related needs will improve Outcome: Progressing   Problem: Clinical Measurements: Goal: Ability to maintain clinical measurements within normal limits will improve Outcome: Progressing Goal: Will remain free from infection Outcome: Progressing Goal: Diagnostic test results will improve Outcome: Progressing Goal: Respiratory complications will improve Outcome: Progressing Goal: Cardiovascular complication will be avoided Outcome: Progressing   Problem: Activity: Goal: Risk for activity intolerance will decrease Outcome: Progressing   Problem: Nutrition: Goal: Adequate nutrition will be maintained Outcome: Progressing   Problem: Elimination: Goal: Will not experience complications related to bowel motility Outcome: Progressing Goal: Will not experience complications related to urinary retention Outcome: Progressing   Problem: Pain Managment: Goal: General experience of comfort will improve and/or be controlled Outcome: Progressing   Problem: Education: Goal: Knowledge of disease or condition will improve Outcome: Progressing Goal: Knowledge of the prescribed therapeutic regimen will improve Outcome: Progressing Goal: Individualized Educational Video(s) Outcome: Progressing   Problem: Respiratory: Goal: Ability to maintain a clear airway will improve Outcome: Progressing Goal: Levels of oxygenation will improve Outcome: Progressing Goal: Ability to maintain adequate ventilation will improve Outcome: Progressing   Problem: Respiratory: Goal: Ability to maintain adequate ventilation will improve Outcome: Progressing Goal: Ability to maintain a clear airway will improve Outcome:  Progressing

## 2024-05-08 NOTE — Progress Notes (Signed)
 Progress Note   Patient: Timothy Matthews FMW:[id number] DOB: 1945/12/19 DOA: 05/05/2024  DOS: the patient was seen and examined on 05/08/2024   Brief hospital course:  78 y.o. male with medical history significant for emphysema, cocaine abuse, GERD, hepatitis C, hypertension, prostate cancer, lung cancer,    PTSD and CVA, who presented to the emergency room with acute onset of worsening dyspnea with associated cough productive of thick whitish sputum as well as wheezing over the last week with no fever or chills.  Found to have exacerbation of his postobstructive pneumonia right upper lobe.   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  Complaining of worsening cough productive sputum on presentation.  Likely postobstructive secondary to mass with developing cavitary pneumonia.  Was supposed to receive bronch 6/16 however was canceled due to ongoing cocaine use.  Continue Unasyn  plus azithromycin .  MRSA swab negative so discontinued vancomycin .  Will repeat CT imaging today to evaluate if there is progression or improvement of postobstructive pneumonia/cavitary pneumonia.  Depending on results, may warrant pulmonology consultation for possible bronchoscopy.  Patient is minimally hypoxic so no urgency at this time.  Likely will just need prolonged antibiotic course.   Acute COPD exacerbation - Exacerbated by above.  Wheezing and shortness of breath.  Continue IV Solu-Medrol , nebulizers, mucolytic's, antibiotics as above.  Supplemental O2 as needed.  Continues to show improvement.   RUL lung adenocarcinoma - Reportedly is followed closely at the [facility].   History of CVA - Aspirin  tomorrow.   Peripheral neuropathy - Neurontin  on board.   BPH - Continue Flomax .   Noncompliance with medical regimen - History of leaving AMA.  [facility].  States he  completed chemo/radiation.  Unable to verify at this time.   Subjective: Patient sitting up in bed.  Still complaining of dry cough, unable to bring up any sputum.  No fever, worsening shortness of breath, chest pain, nausea, vomiting, abdominal pain.  Physical Exam:  Vitals:   05/08/24 0704 05/08/24 0817 05/08/24 0818 05/08/24 0928  BP:  129/89 129/89 138/87  Pulse:  91 90 96  Resp:  16 16   Temp:  98.6 F (37 C) 98.6 F (37 C)   TempSrc:      SpO2: 90% 95% 96% 96%  Weight:      Height:        GENERAL:  Alert, pleasant, no acute distress, disheveled HEENT:  EOMI, nasal cannula CARDIOVASCULAR:  RRR, no murmurs appreciated RESPIRATORY: Trace expiratory wheezing, dry cough GASTROINTESTINAL:  Soft, nontender, nondistended EXTREMITIES:  No LE edema bilaterally NEURO:  No new focal deficits appreciated SKIN:  No rashes noted PSYCH:  Appropriate mood and affect    Data Reviewed:  Imaging Studies: DG Chest 2 View Result Date: 05/05/2024 EXAM: 2 VIEW(S) XRAY OF THE CHEST 05/05/2024 09:01:04 PM COMPARISON: 04/20/2024 CLINICAL HISTORY: SOB. PER ER NOTE; Pt reports shortness of breath that began a few months ago, pt reports he was seen for same earlier this month and left AMA. Pt has hx lung cancer, not currently receiving treatment. Pt also reports cough and congestion. FINDINGS: LUNGS AND PLEURA: Right upper lobe pneumonia, mildly progressive with increased cavitation. Underlying subpleural reticulation/fibrosis, reflecting chronic interstitial lung disease. No pleural effusion. No pneumothorax. HEART AND MEDIASTINUM: No acute abnormality of the cardiac and mediastinal silhouettes. BONES AND SOFT TISSUES: Cervicothoracic surgical hardware noted. No acute osseous  abnormality. IMPRESSION: 1. Right upper lobe pneumonia, mildly progressive with increased cavitation. Electronically signed by: Pinkie Pebbles MD 05/05/2024 09:06 PM EDT RP Workstation: [id number]   DG Chest 2 View Result  Date: 04/20/2024 CLINICAL DATA:  Shortness of breath and productive cough. EXAM: CHEST - 2 VIEW COMPARISON:  April 19, 2024 FINDINGS: The heart size and mediastinal contours are within normal limits. Low lung volumes are noted with mild, stable elevation of the right hemidiaphragm and stable diffusely prominent interstitial lung markings. Stable airspace disease and adjacent area of cavitation is seen within the right upper lobe. Involvement of the adjacent portions of the right middle lobe and right lower lobe are seen. There is a small area of right apical pleural fluid versus pleural thickening. No pneumothorax is identified. Postoperative changes are seen within the lower cervical and upper thoracic spine. The visualized skeletal structures are unremarkable. IMPRESSION: 1. Stable right upper lobe airspace disease and adjacent area of cavitation. 2. Small area of right apical pleural fluid versus pleural thickening. 3. Low lung volumes with mild, stable elevation of the right hemidiaphragm and diffusely prominent interstitial lung markings. Electronically Signed   By: [name] Dials M.D.   On: 04/20/2024 00:55   DG Chest Port 1 View Result Date: 04/19/2024 CLINICAL DATA:  141880 SOB (shortness of breath) [id number] EXAM: PORTABLE CHEST 1 VIEW COMPARISON:  Chest x-ray 03/25/2024, CT angio chest 03/25/2024 FINDINGS: The heart and mediastinal contours are unchanged. Coarsened interstitial markings with improved aeration of the right apex but worsened airspace opacity of the inferior portion of the right upper lobe and superior portion of the left upper lobe. Central lucency within the right upper lobe suggestive of known cavitation. Question small loculated right apical pleural effusion. No pneumothorax. No acute osseous abnormality. IMPRESSION: 1. Improved aeration of the right apex but worsened airspace opacity of the inferior portion of the right upper lobe and superior portion of the left upper lobe. Central  lucency within the right upper lobe suggestive of known cavitation. Question small loculated right apical pleural effusion. Concern for multifocal pneumonia with known underlying mass. Consider pulmonary consultation for consideration of further imaging such as PET CT. 2. Underlying interstitial lung disease. Electronically Signed   By: Morgane  Naveau M.D.   On: 04/19/2024 00:52    Results are pending, will review when available.  Previous records (including but not limited to H&P, progress notes, nursing notes, TOC management) were reviewed in assessment of this patient.  Labs: CBC: Recent Labs  Lab 05/05/24 2045 05/06/24 0454 05/07/24 0337 05/08/24 0342  WBC 9.5 9.6 8.8 12.0*  NEUTROABS 8.7*  --   --   --   HGB 10.8* 9.6* 10.0* 10.6*  HCT 34.1* 30.2* 31.6* 33.9*  MCV 81.6 81.6 81.0 81.5  PLT 216 76* 149* 198   Basic Metabolic Panel: Recent Labs  Lab 05/05/24 2045 05/06/24 0454 05/07/24 0337 05/08/24 0342  NA 138 140 134* 135  K 4.0 4.1 4.0 3.7  CL 107 108 105 106  CO2 24 26 22 24   GLUCOSE 159* 103* 159* 101*  BUN 30* 26* 27* 30*  CREATININE 0.85 0.71 0.98 0.76  CALCIUM  8.4* 7.9* 8.3* 8.4*  MG  --   --  1.8  --    Liver Function Tests: No results for input(s): AST, ALT, ALKPHOS, BILITOT, PROT, ALBUMIN in the last 168 hours. CBG: No results for input(s): GLUCAP in the last 168 hours.  Scheduled Meds:  atorvastatin   40 mg Oral Daily   enoxaparin  (  LOVENOX ) injection  40 mg Subcutaneous Q24H   gabapentin   600 mg Oral TID   guaiFENesin   600 mg Oral BID   ipratropium-albuterol   3 mL Nebulization TID   predniSONE   40 mg Oral Q breakfast   Continuous Infusions:  ampicillin -sulbactam (UNASYN ) IV 3 g (05/08/24 0836)   azithromycin  500 mg (05/08/24 0216)   PRN Meds:.acetaminophen  **OR** acetaminophen , chlorpheniramine-HYDROcodone , ipratropium-albuterol , magnesium  hydroxide, melatonin, ondansetron  **OR** ondansetron  (ZOFRAN ) IV, traZODone   Family  Communication: None at bedside  Disposition: Status is: Inpatient Remains inpatient appropriate because: Ammonia     Time spent: 38 minutes  Length of inpatient stay: 3 days  Author: Carliss LELON Canales, DO 05/08/2024 10:07 AM  For on call review [email or web address].

## 2024-05-08 NOTE — Progress Notes (Addendum)
 Got a call from CCMD, tele shows 9 runs of V tach, no any other symptoms noted. Pt is alert and oriented X 4. Denies any pain.Do calkins made aware. No any new order received.

## 2024-05-09 DIAGNOSIS — J441 Chronic obstructive pulmonary disease with (acute) exacerbation: Secondary | ICD-10-CM | POA: Diagnosis not present

## 2024-05-09 DIAGNOSIS — J189 Pneumonia, unspecified organism: Secondary | ICD-10-CM

## 2024-05-09 DIAGNOSIS — J984 Other disorders of lung: Secondary | ICD-10-CM

## 2024-05-09 DIAGNOSIS — N400 Enlarged prostate without lower urinary tract symptoms: Secondary | ICD-10-CM | POA: Diagnosis not present

## 2024-05-09 LAB — CBC
HCT: 32.3 % — ABNORMAL LOW (ref 39.0–52.0)
Hemoglobin: 10.2 g/dL — ABNORMAL LOW (ref 13.0–17.0)
MCH: 25.8 pg — ABNORMAL LOW (ref 26.0–34.0)
MCHC: 31.6 g/dL (ref 30.0–36.0)
MCV: 81.8 fL (ref 80.0–100.0)
Platelets: 195 K/uL (ref 150–400)
RBC: 3.95 MIL/uL — ABNORMAL LOW (ref 4.22–5.81)
RDW: 21.1 % — ABNORMAL HIGH (ref 11.5–15.5)
WBC: 10.3 K/uL (ref 4.0–10.5)
nRBC: 0 % (ref 0.0–0.2)

## 2024-05-09 LAB — MAGNESIUM: Magnesium: 1.9 mg/dL (ref 1.7–2.4)

## 2024-05-09 LAB — BASIC METABOLIC PANEL WITH GFR
Anion gap: 9 (ref 5–15)
BUN: 27 mg/dL — ABNORMAL HIGH (ref 8–23)
CO2: 22 mmol/L (ref 22–32)
Calcium: 8.3 mg/dL — ABNORMAL LOW (ref 8.9–10.3)
Chloride: 104 mmol/L (ref 98–111)
Creatinine, Ser: 0.94 mg/dL (ref 0.61–1.24)
GFR, Estimated: >60 mL/min
Glucose, Bld: 108 mg/dL — ABNORMAL HIGH (ref 70–99)
Potassium: 4.1 mmol/L (ref 3.5–5.1)
Sodium: 135 mmol/L (ref 135–145)

## 2024-05-09 LAB — BRAIN NATRIURETIC PEPTIDE: B Natriuretic Peptide: 78.6 pg/mL (ref 0.0–100.0)

## 2024-05-09 MED ORDER — ORAL CARE MOUTH RINSE: 15.0000 mL | OROMUCOSAL | Status: DC | PRN

## 2024-05-09 MED ORDER — FUROSEMIDE 10 MG/ML IJ SOLN
40.0000 mg | Freq: Once | INTRAMUSCULAR | Status: AC
  Administered 2024-05-09: 40 mg via INTRAVENOUS
  Filled 2024-05-09: qty 4

## 2024-05-09 MED ORDER — IPRATROPIUM-ALBUTEROL 0.5-2.5 (3) MG/3ML IN SOLN
3.0000 mL | Freq: Two times a day (BID) | RESPIRATORY_TRACT | Status: DC
  Administered 2024-05-09 – 2024-05-11 (×4): 3 mL via RESPIRATORY_TRACT
  Filled 2024-05-09 (×4): qty 3

## 2024-05-09 NOTE — Progress Notes (Signed)
 Progress Note   Patient: Timothy Matthews FMW:[id number] DOB: 1945/11/23 DOA: 05/05/2024  DOS: the patient was seen and examined on 05/09/2024   Brief hospital course:  78 y.o. male with medical history significant for emphysema, cocaine abuse, GERD, hepatitis C, hypertension, prostate cancer, lung cancer,    PTSD and CVA, who presented to the emergency room with acute onset of worsening dyspnea with associated cough productive of thick whitish sputum as well as wheezing over the last week with no fever or chills.  Found to have exacerbation of his postobstructive pneumonia right upper lobe.   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  Complaining of worsening cough productive sputum on presentation.  Likely postobstructive secondary to mass with developing cavitary pneumonia.  Was supposed to receive bronch 6/16 however was canceled due to ongoing cocaine use.  Continue Unasyn  plus azithromycin .  MRSA swab negative so discontinued vancomycin .  Repeat CT imaging 7/27 noting progression and interval enlargement of cavitation.  Will consult pulmonology to evaluate whether bronchoscopy will be warranted.  Patient not hypoxic, leukocytosis resolved.   Acute COPD exacerbation - Exacerbated by above.  Wheezing and shortness of breath on exertion.  Continue corticosteroids, nebulizers, mucolytic's, antibiotics as above.  Supplemental O2 as needed.  Continues to show improvement.   RUL lung adenocarcinoma - Reportedly is followed closely at the [facility].   History of CVA - Aspirin  tomorrow.   Peripheral neuropathy - Neurontin  on board.   BPH - Continue Flomax .   Noncompliance with medical regimen - History of leaving AMA.  [facility].  States he completed chemo/radiation.  Unable to verify at this time.   Subjective: Patient sitting at bedside chair  this morning.  States he is feeling improved.  Denies any fever, chills, chest pain, nausea, vomiting, abdominal pain.  Still has persistent cough.  Feels like he cannot open his right upper lung.  Physical Exam:  Vitals:   05/08/24 2127 05/09/24 0532 05/09/24 0744 05/09/24 0807  BP: (!) 146/89 (!) 130/97  136/86  Pulse: 83 84  88  Resp: 20   19  Temp: 98.2 F (36.8 C) 97.6 F (36.4 C)  97.8 F (36.6 C)  TempSrc:      SpO2: 95% 95% 99% 94%  Weight:      Height:        GENERAL:  Alert, pleasant, no acute distress, disheveled HEENT:  EOMI, nasal cannula CARDIOVASCULAR:  RRR, no murmurs appreciated RESPIRATORY: Trace expiratory wheezing, dry cough GASTROINTESTINAL:  Soft, nontender, nondistended EXTREMITIES:  No LE edema bilaterally NEURO:  No new focal deficits appreciated SKIN:  No rashes noted PSYCH:  Appropriate mood and affect    Data Reviewed:  Imaging Studies: CT CHEST W CONTRAST Result Date: 05/08/2024 CLINICAL DATA:  Pleural mass EXAM: CT CHEST WITH CONTRAST TECHNIQUE: Multidetector CT imaging of the chest was performed during intravenous contrast administration. RADIATION DOSE REDUCTION: This exam was performed according to the departmental dose-optimization program which includes automated exposure control, adjustment of the mA and/or kV according to patient size and/or use of iterative reconstruction technique. CONTRAST:  75mL OMNIPAQUE  IOHEXOL  300 MG/ML  SOLN COMPARISON:  03/25/2024 FINDINGS: Cardiovascular: Aortic atherosclerosis.  Mild cardiomegaly. Mediastinum/Nodes: Likely reactive small subcarinal lymph nodes. Lungs/Pleura: Extensive cavitary process in the right upper lobe with surrounding consolidation and fluid-fluid level within the dominant cavity. Likely other cavities along with varicoid and cystic  bronchiectasis in the right upper lobe anteriorly, increased from previous. Dominant cavity about 7.3 by 5.4 cm on image 44 series 2, formerly 5.7 by 4.2 cm.  Consolidation in the superior segment right lower lobe and in upper portions of the right middle lobe with emphysema and coarse interstitial accentuation through most of the remaining right lower lobe. Suspected pulmonary fibrosis. Peripheral honeycombing, right greater than left. Upper Abdomen: Left kidney upper pole cyst posteriorly, fluid density on prior noncontrast CT. No further imaging workup of this lesion is indicated. Musculoskeletal: Lower cervical and upper thoracic fixation hardware. No right upper rib destruction identified. No obvious chest wall invasion of the right upper lobe process. Remote compression fractures at T11 and L1, unchanged. IMPRESSION: 1. Extensive and enlarging cavitary process in the right upper lobe with surrounding consolidation and fluid-fluid level within the dominant cavity. Likely other cavities along with varicoid and cystic bronchiectasis in the right upper lobe anteriorly, increased from previous. Consolidation in the superior segment right lower lobe and in upper portions of the right middle lobe with emphysema and coarse interstitial accentuation through most of the remaining right lower lobe. Suspected pulmonary fibrosis and peripheral honeycombing. While infectious etiologies such as staphylococcal pneumonia or fungal pneumonia are likely, malignancy in the right upper lobe is not excluded. 2. Mild cardiomegaly. 3. Remote compression fractures at T11 and L1. 4. Aortic Atherosclerosis (ICD10-I70.0) and Emphysema (ICD10-J43.9). Electronically Signed   By: Ryan Salvage M.D.   On: 05/08/2024 15:20   DG Chest 2 View Result Date: 05/05/2024 EXAM: 2 VIEW(S) XRAY OF THE CHEST 05/05/2024 09:01:04 PM COMPARISON: 04/20/2024 CLINICAL HISTORY: SOB. PER ER NOTE; Pt reports shortness of breath that began a few months ago, pt reports he was seen for same earlier this month and left AMA. Pt has hx lung cancer, not currently receiving treatment. Pt also reports cough and  congestion. FINDINGS: LUNGS AND PLEURA: Right upper lobe pneumonia, mildly progressive with increased cavitation. Underlying subpleural reticulation/fibrosis, reflecting chronic interstitial lung disease. No pleural effusion. No pneumothorax. HEART AND MEDIASTINUM: No acute abnormality of the cardiac and mediastinal silhouettes. BONES AND SOFT TISSUES: Cervicothoracic surgical hardware noted. No acute osseous abnormality. IMPRESSION: 1. Right upper lobe pneumonia, mildly progressive with increased cavitation. Electronically signed by: Pinkie Pebbles MD 05/05/2024 09:06 PM EDT RP Workstation: [id number]   DG Chest 2 View Result Date: 04/20/2024 CLINICAL DATA:  Shortness of breath and productive cough. EXAM: CHEST - 2 VIEW COMPARISON:  April 19, 2024 FINDINGS: The heart size and mediastinal contours are within normal limits. Low lung volumes are noted with mild, stable elevation of the right hemidiaphragm and stable diffusely prominent interstitial lung markings. Stable airspace disease and adjacent area of cavitation is seen within the right upper lobe. Involvement of the adjacent portions of the right middle lobe and right lower lobe are seen. There is a small area of right apical pleural fluid versus pleural thickening. No pneumothorax is identified. Postoperative changes are seen within the lower cervical and upper thoracic spine. The visualized skeletal structures are unremarkable. IMPRESSION: 1. Stable right upper lobe airspace disease and adjacent area of cavitation. 2. Small area of right apical pleural fluid versus pleural thickening. 3. Low lung volumes with mild, stable elevation of the right hemidiaphragm and diffusely prominent interstitial lung markings. Electronically Signed   By: [name] Dials M.D.   On: 04/20/2024 00:55   DG Chest Port 1 View Result Date: 04/19/2024 CLINICAL DATA:  141880 SOB (shortness of breath) [id number] EXAM: PORTABLE CHEST 1 VIEW  COMPARISON:  Chest x-ray 03/25/2024, CT angio  chest 03/25/2024 FINDINGS: The heart and mediastinal contours are unchanged. Coarsened interstitial markings with improved aeration of the right apex but worsened airspace opacity of the inferior portion of the right upper lobe and superior portion of the left upper lobe. Central lucency within the right upper lobe suggestive of known cavitation. Question small loculated right apical pleural effusion. No pneumothorax. No acute osseous abnormality. IMPRESSION: 1. Improved aeration of the right apex but worsened airspace opacity of the inferior portion of the right upper lobe and superior portion of the left upper lobe. Central lucency within the right upper lobe suggestive of known cavitation. Question small loculated right apical pleural effusion. Concern for multifocal pneumonia with known underlying mass. Consider pulmonary consultation for consideration of further imaging such as PET CT. 2. Underlying interstitial lung disease. Electronically Signed   By: Morgane  Naveau M.D.   On: 04/19/2024 00:52    CT results as above  Previous records (including but not limited to H&P, progress notes, nursing notes, TOC management) were reviewed in assessment of this patient.  Labs: CBC: Recent Labs  Lab 05/05/24 2045 05/06/24 0454 05/07/24 0337 05/08/24 0342 05/09/24 0213  WBC 9.5 9.6 8.8 12.0* 10.3  NEUTROABS 8.7*  --   --   --   --   HGB 10.8* 9.6* 10.0* 10.6* 10.2*  HCT 34.1* 30.2* 31.6* 33.9* 32.3*  MCV 81.6 81.6 81.0 81.5 81.8  PLT 216 76* 149* 198 195   Basic Metabolic Panel: Recent Labs  Lab 05/05/24 2045 05/06/24 0454 05/07/24 0337 05/08/24 0342 05/09/24 0213  NA 138 140 134* 135 135  K 4.0 4.1 4.0 3.7 4.1  CL 107 108 105 106 104  CO2 24 26 22 24 22   GLUCOSE 159* 103* 159* 101* 108*  BUN 30* 26* 27* 30* 27*  CREATININE 0.85 0.71 0.98 0.76 0.94  CALCIUM  8.4* 7.9* 8.3* 8.4* 8.3*  MG  --   --  1.8  --  1.9   Liver Function Tests: No results for input(s): AST, ALT, ALKPHOS,  BILITOT, PROT, ALBUMIN in the last 168 hours. CBG: No results for input(s): GLUCAP in the last 168 hours.  Scheduled Meds:  atorvastatin   40 mg Oral Daily   baclofen   5 mg Oral BID   benzonatate   100 mg Oral BID   enoxaparin  (LOVENOX ) injection  40 mg Subcutaneous Q24H   gabapentin   600 mg Oral TID   guaiFENesin   600 mg Oral BID   ipratropium-albuterol   3 mL Nebulization BID   predniSONE   40 mg Oral Q breakfast   tamsulosin   0.4 mg Oral QHS   Continuous Infusions:  ampicillin -sulbactam (UNASYN ) IV 3 g (05/09/24 0846)   azithromycin  500 mg (05/08/24 2207)   PRN Meds:.acetaminophen  **OR** acetaminophen , chlorpheniramine-HYDROcodone , ipratropium-albuterol , magnesium  hydroxide, melatonin, ondansetron  **OR** ondansetron  (ZOFRAN ) IV, mouth rinse, traZODone   Family Communication: None at bedside  Disposition: Status is: Inpatient Remains inpatient appropriate because: Cavitary pneumonia     Time spent: 35 minutes  Length of inpatient stay: 4 days  Author: Carliss LELON Canales, DO 05/09/2024 11:53 AM  For on call review [email or web address].

## 2024-05-09 NOTE — H&P (View-Only) (Signed)
 NAME:  Timothy Matthews, MRN:  [id number], DOB:  11-28-1945, LOS: 4 ADMISSION DATE:  05/05/2024, CONSULTATION DATE:  05/09/2024 REFERRING MD:  Carliss Canales DO, CHIEF COMPLAINT:  Shrotness of breath.    History of Present Illness:  Timothy Matthews is a 77 year old male patient with a past medical history of tobacco use, polysubstance use specifically smoking crack, recent diagnosis of right upper lobe lung cancer being managed at the [facility] no documentation on file presenting to [facility] on 06/13 with ongoing shortness of breath and cough with phlegm for months. Pulmonary team is being consulted for help with further evaluation.   He was admitted last month with worsening shortness of breath. CT chest at the time showed RUL cavitary lesion with increased cosolidative opacification and interlobular septal thickening. Managed as penumonia and discharge home on levofloxacin . Sputum culture at the time with normal flora.   He also reports that he he is being managed for lung cancer at the [facility] and his last radiation therapy was in March 2025 and his last chemotherapy was at the same time and only received 3 cycles. Unclear if he continues to follow-up and he is compliant with follow-ups.   He comes back now with worsening shortness of breath on exertion, worsening lower extremities swelling. CT chest 07/28 with worsening right upper lobe cavitary lesion and now consolidative opacity involving the right middle lobe and right lower lobe.   He has been on unasyn  and azithromycin  as well as prednisone  40 for the last 3 days with reported clinical improvement.   Pertinent  Medical History  As above.   Objective    Blood pressure (!) 143/75, pulse 84, temperature 97.8 F (36.6 C), resp. rate 20, height 5' 10 (1.778 m), weight 72.3 kg, SpO2 96%.        Intake/Output Summary (Last 24 hours) at 05/09/2024 1540 Last data filed at 05/09/2024 1425 Gross per 24 hour  Intake 740 ml  Output 2030 ml  Net -1290 ml    Filed Weights   05/05/24 2035  Weight: 72.3 kg    Examination: General: Mild respiratory distress specifically on exertion.  HENT: Supple neck, reactive pupils and poor dental hygiene Lungs: Diffuse crackles appreciated Cardiovascular: Normal S1, Normal S2, RRR  Abdomen: Soft, non tender, non distended  Extremities: Warm and well perfused  Labs and imaging were reviewed,   Assessment and Plan  Mr. Newsham is a 78 year old male patient with a past medical history of tobacco use, polysubstance use specifically smoking crack, recent diagnosis of right upper lobe lung cancer being managed at the [facility] no documentation on file presenting to [facility] on 06/13 with ongoing shortness of breath and cough with phlegm for months. Pulmonary team is being consulted for help with further evaluation.   #Worsening RUL Cavitary lung lesion with differential including aspergillosis, worsening lung cancer, or fungal infection.  #Hx of RUL Lung Ca s/p chemo radiation with last radiation in March 2025.  #COPD  #Hx of polysubstance use include smoking crack but reports hasn't used for the last couple of months.   Plan  []  NPO after midnight  []  Diagnostic bronchoscopy with BAL tomorrow +/- EBUS.  []  Echocardiogram, BNP  []  Lasix  IV 40mg  x1  []  Agree with systemic steroids and agree with current Abx.   I spent 80 minutes caring for this patient today, including preparing to see the patient, obtaining a medical history , reviewing a separately obtained history, performing a medically appropriate examination and/or evaluation, counseling and educating  the patient/family/caregiver, ordering medications, tests, or procedures, documenting clinical information in the electronic health record, and independently interpreting results (not separately reported/billed) and communicating results to the patient/family/caregiver  Darrin Barn, MD [facility] 05/09/2024 3:52 PM ,

## 2024-05-09 NOTE — Plan of Care (Signed)
   Problem: Education: Goal: Knowledge of General Education information will improve Description Including pain rating scale, medication(s)/side effects and non-pharmacologic comfort measures Outcome: Progressing

## 2024-05-09 NOTE — Care Management Important Message (Signed)
 Important Message  Patient Details  Name: Timothy Matthews MRN: [id number] Date of Birth: 02/29/1946   Important Message Given:  Yes - Medicare IM     Ernesha Ramone W, CMA 05/09/2024, 12:21 PM

## 2024-05-09 NOTE — Plan of Care (Signed)

## 2024-05-09 NOTE — Consult Note (Signed)
 NAME:  CASTER FAYETTE, MRN:  [id number], DOB:  11-28-1945, LOS: 4 ADMISSION DATE:  05/05/2024, CONSULTATION DATE:  05/09/2024 REFERRING MD:  Carliss Canales DO, CHIEF COMPLAINT:  Shrotness of breath.    History of Present Illness:  Mr. Ola is a 78 year old male patient with a past medical history of tobacco use, polysubstance use specifically smoking crack, recent diagnosis of right upper lobe lung cancer being managed at the [facility] no documentation on file presenting to [facility] on 06/13 with ongoing shortness of breath and cough with phlegm for months. Pulmonary team is being consulted for help with further evaluation.   He was admitted last month with worsening shortness of breath. CT chest at the time showed RUL cavitary lesion with increased cosolidative opacification and interlobular septal thickening. Managed as penumonia and discharge home on levofloxacin . Sputum culture at the time with normal flora.   He also reports that he he is being managed for lung cancer at the [facility] and his last radiation therapy was in March 2025 and his last chemotherapy was at the same time and only received 3 cycles. Unclear if he continues to follow-up and he is compliant with follow-ups.   He comes back now with worsening shortness of breath on exertion, worsening lower extremities swelling. CT chest 07/28 with worsening right upper lobe cavitary lesion and now consolidative opacity involving the right middle lobe and right lower lobe.   He has been on unasyn  and azithromycin  as well as prednisone  40 for the last 3 days with reported clinical improvement.   Pertinent  Medical History  As above.   Objective    Blood pressure (!) 143/75, pulse 84, temperature 97.8 F (36.6 C), resp. rate 20, height 5' 10 (1.778 m), weight 72.3 kg, SpO2 96%.        Intake/Output Summary (Last 24 hours) at 05/09/2024 1540 Last data filed at 05/09/2024 1425 Gross per 24 hour  Intake 740 ml  Output 2030 ml  Net -1290 ml    Filed Weights   05/05/24 2035  Weight: 72.3 kg    Examination: General: Mild respiratory distress specifically on exertion.  HENT: Supple neck, reactive pupils and poor dental hygiene Lungs: Diffuse crackles appreciated Cardiovascular: Normal S1, Normal S2, RRR  Abdomen: Soft, non tender, non distended  Extremities: Warm and well perfused  Labs and imaging were reviewed,   Assessment and Plan  Mr. Newsham is a 78 year old male patient with a past medical history of tobacco use, polysubstance use specifically smoking crack, recent diagnosis of right upper lobe lung cancer being managed at the [facility] no documentation on file presenting to [facility] on 06/13 with ongoing shortness of breath and cough with phlegm for months. Pulmonary team is being consulted for help with further evaluation.   #Worsening RUL Cavitary lung lesion with differential including aspergillosis, worsening lung cancer, or fungal infection.  #Hx of RUL Lung Ca s/p chemo radiation with last radiation in March 2025.  #COPD  #Hx of polysubstance use include smoking crack but reports hasn't used for the last couple of months.   Plan  []  NPO after midnight  []  Diagnostic bronchoscopy with BAL tomorrow +/- EBUS.  []  Echocardiogram, BNP  []  Lasix  IV 40mg  x1  []  Agree with systemic steroids and agree with current Abx.   I spent 80 minutes caring for this patient today, including preparing to see the patient, obtaining a medical history , reviewing a separately obtained history, performing a medically appropriate examination and/or evaluation, counseling and educating  the patient/family/caregiver, ordering medications, tests, or procedures, documenting clinical information in the electronic health record, and independently interpreting results (not separately reported/billed) and communicating results to the patient/family/caregiver  Darrin Barn, MD [facility] 05/09/2024 3:52 PM ,

## 2024-05-10 ENCOUNTER — Encounter
Admission: EM | Disposition: A | Discharge status: Other Acute Inpatient Hospital | Payer: Self-pay | Source: Home / Self Care | Attending: Internal Medicine

## 2024-05-10 ENCOUNTER — Inpatient Hospital Stay

## 2024-05-10 ENCOUNTER — Inpatient Hospital Stay: Admitting: Certified Registered Nurse Anesthetist

## 2024-05-10 ENCOUNTER — Inpatient Hospital Stay
Admit: 2024-05-10 | Discharge: 2024-05-10 | Disposition: A | Discharge status: Home / Self Care | Attending: Pulmonary Disease

## 2024-05-10 ENCOUNTER — Encounter: Payer: Self-pay | Admitting: Family Medicine

## 2024-05-10 DIAGNOSIS — I5033 Acute on chronic diastolic (congestive) heart failure: Secondary | ICD-10-CM | POA: Diagnosis not present

## 2024-05-10 DIAGNOSIS — E785 Hyperlipidemia, unspecified: Secondary | ICD-10-CM | POA: Diagnosis not present

## 2024-05-10 DIAGNOSIS — N400 Enlarged prostate without lower urinary tract symptoms: Secondary | ICD-10-CM | POA: Diagnosis not present

## 2024-05-10 DIAGNOSIS — J984 Other disorders of lung: Secondary | ICD-10-CM | POA: Diagnosis not present

## 2024-05-10 DIAGNOSIS — J441 Chronic obstructive pulmonary disease with (acute) exacerbation: Secondary | ICD-10-CM | POA: Diagnosis not present

## 2024-05-10 DIAGNOSIS — J189 Pneumonia, unspecified organism: Secondary | ICD-10-CM | POA: Diagnosis not present

## 2024-05-10 HISTORY — PX: VIDEO BRONCHOSCOPY WITH ENDOBRONCHIAL ULTRASOUND: SHX6177

## 2024-05-10 LAB — ECHOCARDIOGRAM COMPLETE
AR max vel: 3.63 cm2
AV Area VTI: 4.04 cm2
AV Area mean vel: 3.37 cm2
AV Mean grad: 3.5 mmHg
AV Peak grad: 6.1 mmHg
Ao pk vel: 1.24 m/s
Area-P 1/2: 2.91 cm2
Height: 70 in
MV VTI: 3.11 cm2
S' Lateral: 2.5 cm
Weight: 2550.28 [oz_av]

## 2024-05-10 LAB — BASIC METABOLIC PANEL WITH GFR
Anion gap: 10 (ref 5–15)
BUN: 25 mg/dL — ABNORMAL HIGH (ref 8–23)
CO2: 28 mmol/L (ref 22–32)
Calcium: 8.6 mg/dL — ABNORMAL LOW (ref 8.9–10.3)
Chloride: 99 mmol/L (ref 98–111)
Creatinine, Ser: 0.88 mg/dL (ref 0.61–1.24)
GFR, Estimated: >60 mL/min (ref >60)
Glucose, Bld: 105 mg/dL — ABNORMAL HIGH (ref 70–99)
Potassium: 3.9 mmol/L (ref 3.5–5.1)
Sodium: 137 mmol/L (ref 135–145)

## 2024-05-10 LAB — BODY FLUID CELL COUNT WITH DIFFERENTIAL
Eos, Fluid: 1 %
Lymphs, Fluid: 1 %
Monocyte-Macrophage-Serous Fluid: 6 % — ABNORMAL LOW (ref 50–90)
Neutrophil Count, Fluid: 92 % — ABNORMAL HIGH (ref 0–25)
Total Nucleated Cell Count, Fluid: 49 uL (ref 0–1000)

## 2024-05-10 LAB — CBC
HCT: 31.2 % — ABNORMAL LOW (ref 39.0–52.0)
Hemoglobin: 9.9 g/dL — ABNORMAL LOW (ref 13.0–17.0)
MCH: 25.8 pg — ABNORMAL LOW (ref 26.0–34.0)
MCHC: 31.7 g/dL (ref 30.0–36.0)
MCV: 81.3 fL (ref 80.0–100.0)
Platelets: 200 K/uL (ref 150–400)
RBC: 3.84 MIL/uL — ABNORMAL LOW (ref 4.22–5.81)
RDW: 20.9 % — ABNORMAL HIGH (ref 11.5–15.5)
WBC: 10.4 K/uL (ref 4.0–10.5)
nRBC: 0 % (ref 0.0–0.2)

## 2024-05-10 LAB — CULTURE, BLOOD (ROUTINE X 2)
Culture: NO GROWTH
Culture: NO GROWTH
Special Requests: ADEQUATE

## 2024-05-10 LAB — MAGNESIUM: Magnesium: 2.2 mg/dL (ref 1.7–2.4)

## 2024-05-10 SURGERY — BRONCHOSCOPY, WITH EBUS
Anesthesia: General | Laterality: Bilateral

## 2024-05-10 MED ORDER — FENTANYL CITRATE (PF) 100 MCG/2ML IJ SOLN
INTRAMUSCULAR | Status: DC | PRN
  Administered 2024-05-10: 50 ug via INTRAVENOUS

## 2024-05-10 MED ORDER — DEXAMETHASONE SODIUM PHOSPHATE 10 MG/ML IJ SOLN
INTRAMUSCULAR | Status: DC | PRN
  Administered 2024-05-10: 8 mg via INTRAVENOUS

## 2024-05-10 MED ORDER — ONDANSETRON HCL 4 MG/2ML IJ SOLN
INTRAMUSCULAR | Status: DC | PRN
  Administered 2024-05-10: 4 mg via INTRAVENOUS

## 2024-05-10 MED ORDER — FUROSEMIDE 10 MG/ML IJ SOLN
40.0000 mg | Freq: Every day | INTRAMUSCULAR | Status: DC
  Administered 2024-05-10 – 2024-05-15 (×6): 40 mg via INTRAVENOUS
  Filled 2024-05-10 (×7): qty 4

## 2024-05-10 MED ORDER — ACETAMINOPHEN 10 MG/ML IV SOLN: 1000.0000 mg | Freq: Once | INTRAVENOUS | Status: DC | PRN

## 2024-05-10 MED ORDER — OXYCODONE HCL 5 MG/5ML PO SOLN: 5.0000 mg | Freq: Once | ORAL | Status: DC | PRN

## 2024-05-10 MED ORDER — FENTANYL CITRATE (PF) 100 MCG/2ML IJ SOLN
INTRAMUSCULAR | Status: AC
Start: 2024-05-10 — End: 2024-05-10
  Filled 2024-05-10: qty 2

## 2024-05-10 MED ORDER — LACTATED RINGERS IV SOLN: INTRAVENOUS | Status: DC

## 2024-05-10 MED ORDER — FENTANYL CITRATE (PF) 100 MCG/2ML IJ SOLN: 25.0000 ug | INTRAMUSCULAR | Status: DC | PRN

## 2024-05-10 MED ORDER — PHENYLEPHRINE HCL-NACL 20-0.9 MG/250ML-% IV SOLN
INTRAVENOUS | Status: DC | PRN
Start: 2024-05-10 — End: 2024-05-10
  Administered 2024-05-10: 25 ug/min via INTRAVENOUS

## 2024-05-10 MED ORDER — ROCURONIUM BROMIDE 100 MG/10ML IV SOLN
INTRAVENOUS | Status: DC | PRN
  Administered 2024-05-10: 40 mg via INTRAVENOUS

## 2024-05-10 MED ORDER — SUGAMMADEX SODIUM 200 MG/2ML IV SOLN
INTRAVENOUS | Status: DC | PRN
  Administered 2024-05-10: 200 mg via INTRAVENOUS

## 2024-05-10 MED ORDER — SODIUM CHLORIDE 0.9 % IV SOLN: INTRAVENOUS | Status: DC | PRN

## 2024-05-10 MED ORDER — PROPOFOL 10 MG/ML IV BOLUS
INTRAVENOUS | Status: DC | PRN
  Administered 2024-05-10: 100 ug/kg/min via INTRAVENOUS
  Administered 2024-05-10: 100 mg via INTRAVENOUS

## 2024-05-10 MED ORDER — OXYCODONE HCL 5 MG PO TABS: 5.0000 mg | ORAL_TABLET | Freq: Once | ORAL | Status: DC | PRN

## 2024-05-10 MED ORDER — LIDOCAINE HCL (CARDIAC) PF 100 MG/5ML IV SOSY
PREFILLED_SYRINGE | INTRAVENOUS | Status: DC | PRN
  Administered 2024-05-10: 80 mg via INTRAVENOUS

## 2024-05-10 NOTE — Interval H&P Note (Signed)
 S: reports shortness of breath. No chest pain.  O: Vitals:   05/10/24 1237 05/10/24 1346  BP: 112/64 (!) 139/91  Pulse: 90 93  Resp: 16 16  Temp: 98.1 F (36.7 C) 98.5 F (36.9 C)  SpO2: 95% 95%   General: in no distress HEENT: Mucous membranes moist, no evidence of postnasal drip Respiratory: Trachea is midline, no respiratory distress, Decreased air entry over the lung lung field. Crackles over the left. Cardiovascular: Heart with regular rate and rhythm, normal S1 and S2, no murmurs, rubs, or gallops Gastrointestinal: Normoactive bowel sounds, soft and nontender Skin: No rashes on limited exam Neuro: Alert and oriented, no gross focal deficits  A/P: 78 year old male with history of lung cancer s/p chemoradiation who presents with worsening shortness of breath and found to have a RUL cavitary lesion that is concerning for necrotizing pneumonia vs progressive malignancy. Plan for fiber optic evaluation with flexible bronchoscopy for airway survey and BAL. Will also evaluate the hilar and mediastinal lymph nodes with EBUS. Risks and benefits discussed and the patient agrees to proceed.  Belva November, MD [facility] 05/10/2024 2:06 PM

## 2024-05-10 NOTE — Progress Notes (Signed)
*  PRELIMINARY RESULTS* Echocardiogram 2D Echocardiogram has been performed.  Floydene Harder 05/10/2024, 10:58 AM

## 2024-05-10 NOTE — Progress Notes (Signed)
 Progress Note   Patient: Timothy Matthews FMW:[id number] DOB: 1946-07-18 DOA: 05/05/2024  DOS: the patient was seen and examined on 05/10/2024   Brief hospital course:  78 y.o. male with medical history significant for emphysema, cocaine abuse, GERD, hepatitis C, hypertension, prostate cancer, lung cancer,    PTSD and CVA, who presented to the emergency room with acute onset of worsening dyspnea with associated cough productive of thick whitish sputum as well as wheezing over the last week with no fever or chills.  Found to have exacerbation of his postobstructive pneumonia right upper lobe.   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  Complaining of worsening cough productive sputum on presentation.  Likely postobstructive secondary to mass with developing cavitary pneumonia.  Was supposed to receive bronch 6/16 however was canceled due to ongoing cocaine use.  Continue Unasyn  plus azithromycin .  MRSA swab negative so discontinued vancomycin .  Repeat CT imaging 7/27 noting progression and interval enlargement of cavitation.  Pulmonology consulted with scheduled bronchoscopy and EUS later today.  Concern for cavitating infectious process such as aspergillosis or fungal.  Patient not hypoxic, leukocytosis resolved.   Acute COPD exacerbation - Exacerbated by above.  Wheezing and shortness of breath on exertion.  Continue corticosteroids, nebulizers, mucolytic's, antibiotics as above.  Supplemental O2 as needed.  Continues to show improvement.  Possible acute exacerbation of chronic HFpEF - Noted worsening BL LE edema.  Likely exacerbated by IV fluids and antibiotic use.  No worsening shortness of breath.  Responded well to IV Lasix  x 1 yesterday.  Will continue scheduled IV Lasix .  Monitor urine output.  Recheck kidney function and monitor respiratory status closely.    RUL lung adenocarcinoma -  Reportedly is followed closely at the [facility]. status post chemoradiation.   History of CVA - Aspirin  on board   Peripheral neuropathy - Neurontin  on board.   BPH - Continue Flomax .   Noncompliance with medical regimen - History of leaving AMA.  [facility].  States he completed chemo/radiation.     Subjective: Patient resting comfortably this morning.  Sitting up at the bedside chair.  Denies any worsening fever, chills, chest pain, nausea, vomiting, abdominal pain.  Still planing of lower extremity swelling and unable to open up the right side of his chest to breathe easily.  Not requiring any supplemental oxygen however.  Physical Exam:  Vitals:   05/09/24 2317 05/10/24 0448 05/10/24 0821 05/10/24 1237  BP: 138/84 125/78 (!) 142/90 112/64  Pulse: 81 87 81 90  Resp: 18  18 16   Temp: 98.4 F (36.9 C) 98.4 F (36.9 C) 98.2 F (36.8 C) 98.1 F (36.7 C)  TempSrc:   Oral   SpO2: 97% 100% 97% 95%  Weight:      Height:        GENERAL:  Alert, pleasant, no acute distress  HEENT:  EOMI, nasal cannula CARDIOVASCULAR:  RRR, no murmurs appreciated RESPIRATORY: Dry cough, poor air movement GASTROINTESTINAL:  Soft, nontender, nondistended EXTREMITIES: BL LE pitting edema NEURO:  No new focal deficits appreciated SKIN:  No rashes noted PSYCH:  Appropriate mood and affect     Data Reviewed:  Imaging Studies: CT CHEST W CONTRAST Result Date: 05/08/2024 CLINICAL DATA:  Pleural mass EXAM: CT CHEST WITH CONTRAST TECHNIQUE: Multidetector CT imaging of the chest was performed during intravenous contrast administration. RADIATION DOSE REDUCTION: This exam was  performed according to the departmental dose-optimization program which includes automated exposure control, adjustment of the mA and/or kV according to patient size and/or use of iterative reconstruction technique. CONTRAST:  75mL OMNIPAQUE  IOHEXOL  300 MG/ML  SOLN COMPARISON:  03/25/2024  FINDINGS: Cardiovascular: Aortic atherosclerosis.  Mild cardiomegaly. Mediastinum/Nodes: Likely reactive small subcarinal lymph nodes. Lungs/Pleura: Extensive cavitary process in the right upper lobe with surrounding consolidation and fluid-fluid level within the dominant cavity. Likely other cavities along with varicoid and cystic bronchiectasis in the right upper lobe anteriorly, increased from previous. Dominant cavity about 7.3 by 5.4 cm on image 44 series 2, formerly 5.7 by 4.2 cm. Consolidation in the superior segment right lower lobe and in upper portions of the right middle lobe with emphysema and coarse interstitial accentuation through most of the remaining right lower lobe. Suspected pulmonary fibrosis. Peripheral honeycombing, right greater than left. Upper Abdomen: Left kidney upper pole cyst posteriorly, fluid density on prior noncontrast CT. No further imaging workup of this lesion is indicated. Musculoskeletal: Lower cervical and upper thoracic fixation hardware. No right upper rib destruction identified. No obvious chest wall invasion of the right upper lobe process. Remote compression fractures at T11 and L1, unchanged. IMPRESSION: 1. Extensive and enlarging cavitary process in the right upper lobe with surrounding consolidation and fluid-fluid level within the dominant cavity. Likely other cavities along with varicoid and cystic bronchiectasis in the right upper lobe anteriorly, increased from previous. Consolidation in the superior segment right lower lobe and in upper portions of the right middle lobe with emphysema and coarse interstitial accentuation through most of the remaining right lower lobe. Suspected pulmonary fibrosis and peripheral honeycombing. While infectious etiologies such as staphylococcal pneumonia or fungal pneumonia are likely, malignancy in the right upper lobe is not excluded. 2. Mild cardiomegaly. 3. Remote compression fractures at T11 and L1. 4. Aortic Atherosclerosis  (ICD10-I70.0) and Emphysema (ICD10-J43.9). Electronically Signed   By: Ryan Salvage M.D.   On: 05/08/2024 15:20   DG Chest 2 View Result Date: 05/05/2024 EXAM: 2 VIEW(S) XRAY OF THE CHEST 05/05/2024 09:01:04 PM COMPARISON: 04/20/2024 CLINICAL HISTORY: SOB. PER ER NOTE; Pt reports shortness of breath that began a few months ago, pt reports he was seen for same earlier this month and left AMA. Pt has hx lung cancer, not currently receiving treatment. Pt also reports cough and congestion. FINDINGS: LUNGS AND PLEURA: Right upper lobe pneumonia, mildly progressive with increased cavitation. Underlying subpleural reticulation/fibrosis, reflecting chronic interstitial lung disease. No pleural effusion. No pneumothorax. HEART AND MEDIASTINUM: No acute abnormality of the cardiac and mediastinal silhouettes. BONES AND SOFT TISSUES: Cervicothoracic surgical hardware noted. No acute osseous abnormality. IMPRESSION: 1. Right upper lobe pneumonia, mildly progressive with increased cavitation. Electronically signed by: Pinkie Pebbles MD 05/05/2024 09:06 PM EDT RP Workstation: [id number]   DG Chest 2 View Result Date: 04/20/2024 CLINICAL DATA:  Shortness of breath and productive cough. EXAM: CHEST - 2 VIEW COMPARISON:  April 19, 2024 FINDINGS: The heart size and mediastinal contours are within normal limits. Low lung volumes are noted with mild, stable elevation of the right hemidiaphragm and stable diffusely prominent interstitial lung markings. Stable airspace disease and adjacent area of cavitation is seen within the right upper lobe. Involvement of the adjacent portions of the right middle lobe and right lower lobe are seen. There is a small area of right apical pleural fluid versus pleural thickening. No pneumothorax is identified. Postoperative changes are seen within the lower cervical and upper thoracic spine. The visualized skeletal structures are  unremarkable. IMPRESSION: 1. Stable right upper lobe airspace  disease and adjacent area of cavitation. 2. Small area of right apical pleural fluid versus pleural thickening. 3. Low lung volumes with mild, stable elevation of the right hemidiaphragm and diffusely prominent interstitial lung markings. Electronically Signed   By: [name] Dials M.D.   On: 04/20/2024 00:55   DG Chest Port 1 View Result Date: 04/19/2024 CLINICAL DATA:  [id number] SOB (shortness of breath) [id number] EXAM: PORTABLE CHEST 1 VIEW COMPARISON:  Chest x-ray 03/25/2024, CT angio chest 03/25/2024 FINDINGS: The heart and mediastinal contours are unchanged. Coarsened interstitial markings with improved aeration of the right apex but worsened airspace opacity of the inferior portion of the right upper lobe and superior portion of the left upper lobe. Central lucency within the right upper lobe suggestive of known cavitation. Question small loculated right apical pleural effusion. No pneumothorax. No acute osseous abnormality. IMPRESSION: 1. Improved aeration of the right apex but worsened airspace opacity of the inferior portion of the right upper lobe and superior portion of the left upper lobe. Central lucency within the right upper lobe suggestive of known cavitation. Question small loculated right apical pleural effusion. Concern for multifocal pneumonia with known underlying mass. Consider pulmonary consultation for consideration of further imaging such as PET CT. 2. Underlying interstitial lung disease. Electronically Signed   By: Morgane  Naveau M.D.   On: 04/19/2024 00:52    Imaging as above  Previous records (including but not limited to H&P, progress notes, nursing notes, TOC management) were reviewed in assessment of this patient.  Labs: CBC: Recent Labs  Lab 05/05/24 2045 05/06/24 0454 05/07/24 0337 05/08/24 0342 05/09/24 0213 05/10/24 0521  WBC 9.5 9.6 8.8 12.0* 10.3 10.4  NEUTROABS 8.7*  --   --   --   --   --   HGB 10.8* 9.6* 10.0* 10.6* 10.2* 9.9*  HCT 34.1* 30.2* 31.6* 33.9*  32.3* 31.2*  MCV 81.6 81.6 81.0 81.5 81.8 81.3  PLT 216 76* 149* 198 195 200   Basic Metabolic Panel: Recent Labs  Lab 05/06/24 0454 05/07/24 0337 05/08/24 0342 05/09/24 0213 05/10/24 0521  NA 140 134* 135 135 137  K 4.1 4.0 3.7 4.1 3.9  CL 108 105 106 104 99  CO2 26 22 24 22 28   GLUCOSE 103* 159* 101* 108* 105*  BUN 26* 27* 30* 27* 25*  CREATININE 0.71 0.98 0.76 0.94 0.88  CALCIUM  7.9* 8.3* 8.4* 8.3* 8.6*  MG  --  1.8  --  1.9 2.2   Liver Function Tests: No results for input(s): AST, ALT, ALKPHOS, BILITOT, PROT, ALBUMIN in the last 168 hours. CBG: No results for input(s): GLUCAP in the last 168 hours.  Scheduled Meds:  atorvastatin   40 mg Oral Daily   baclofen   5 mg Oral BID   benzonatate   100 mg Oral BID   enoxaparin  (LOVENOX ) injection  40 mg Subcutaneous Q24H   furosemide   40 mg Intravenous Daily   gabapentin   600 mg Oral TID   guaiFENesin   600 mg Oral BID   ipratropium-albuterol   3 mL Nebulization BID   predniSONE   40 mg Oral Q breakfast   tamsulosin   0.4 mg Oral QHS   Continuous Infusions:  ampicillin -sulbactam (UNASYN ) IV 3 g (05/10/24 1302)   PRN Meds:.acetaminophen  **OR** acetaminophen , chlorpheniramine-HYDROcodone , ipratropium-albuterol , magnesium  hydroxide, melatonin, ondansetron  **OR** ondansetron  (ZOFRAN ) IV, mouth rinse, traZODone   Family Communication: None at bedside  Disposition: Status is: Inpatient Remains inpatient appropriate because: Cavitary pneumonia  Time spent: 38 minutes  Length of inpatient stay: 5 days  Author: Carliss LELON Canales, DO 05/10/2024 1:10 PM  For on call review [email or web address].

## 2024-05-10 NOTE — Anesthesia Preprocedure Evaluation (Addendum)
 Anesthesia Evaluation  Patient identified by MRN, date of birth, ID band Patient awake    Reviewed: Allergy & Precautions, NPO status , Patient's Chart, lab work & pertinent test results  History of Anesthesia Complications Negative for: history of anesthetic complications  Airway Mallampati: IV  TM Distance: <3 FB Neck ROM: Full    Dental  (+) Missing, Poor Dentition, Chipped   Pulmonary COPD, Current Smoker, former smoker (quit 2 months ago) Lung adenocarcinoma   Pulmonary exam normal breath sounds clear to auscultation       Cardiovascular hypertension, Normal cardiovascular exam Rhythm:Regular Rate:Normal  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; LAFB   Neuro/Psych  PSYCHIATRIC DISORDERS (PTSD) Anxiety Depression    Marijuana use; cocaine use disorder, none in 2 months; HOH  Neuromuscular disease (peripheral neuropathy) CVA (greater than five years ago; ambulates with cane)    GI/Hepatic ,GERD  ,,(+) Hepatitis -, C  Endo/Other  negative endocrine ROS    Renal/GU negative Renal ROS   Prostate CA    Musculoskeletal   Abdominal   Peds  Hematology negative hematology ROS (+)   Anesthesia Other Findings   Reproductive/Obstetrics                              Anesthesia Physical Anesthesia Plan  ASA: 3  Anesthesia Plan: General   Post-op Pain Management:    Induction: Intravenous  PONV Risk Score and Plan: 1 and Ondansetron , Dexamethasone  and Treatment may vary due to age or medical condition  Airway Management Planned: Oral ETT  Additional Equipment:   Intra-op Plan:   Post-operative Plan: Extubation in OR  Informed Consent: I have reviewed the patients History and Physical, chart, labs and discussed the procedure including the risks, benefits and alternatives for the proposed anesthesia with the patient or authorized representative who has indicated his/her understanding and acceptance.      Dental advisory given  Plan Discussed with: CRNA  Anesthesia Plan Comments: (Patient consented for risks of anesthesia including but not limited to:  - adverse reactions to medications - damage to eyes, teeth, lips or other oral mucosa - nerve damage due to positioning  - sore throat or hoarseness - damage to heart, brain, nerves, lungs, other parts of body or loss of life  Informed patient about role of CRNA in peri- and intra-operative care.  Patient voiced understanding.)         Anesthesia Quick Evaluation

## 2024-05-10 NOTE — Anesthesia Procedure Notes (Addendum)
 Procedure Name: Intubation Date/Time: 05/10/2024 2:31 PM  Performed by: Claudene Cornet, CRNAPre-anesthesia Checklist: Patient identified, Emergency Drugs available, Suction available and Patient being monitored Patient Re-evaluated:Patient Re-evaluated prior to induction Oxygen Delivery Method: Circle system utilized Preoxygenation: Pre-oxygenation with 100% oxygen Induction Type: IV induction and Cricoid Pressure applied Ventilation: Mask ventilation without difficulty and Oral airway inserted - appropriate to patient size Laryngoscope Size: McGrath (X blade) Grade View: Grade II Tube type: Oral Tube size: 8.0 mm Number of attempts: 3 (Attempted with McGrath 4 blade with poor view; Switched to X blade with grade 2b but unable to pass ETT d/t very anterior; attempted flexible bougie w/o success; success was with fiberoptic from pulm MD; easy mask throughout with OAW 9.) Airway Equipment and Method: Stylet and Video-laryngoscopy Placement Confirmation: ETT inserted through vocal cords under direct vision, positive ETCO2 and breath sounds checked- equal and bilateral Secured at: 23 cm Tube secured with: Tape Dental Injury: Teeth and Oropharynx as per pre-operative assessment  Difficulty Due To: Difficulty was anticipated, Difficult Airway- due to reduced neck mobility, Difficult Airway- due to anterior larynx and Difficult Airway- due to dentition Future Recommendations: Recommend- induction with short-acting agent, and alternative techniques readily available

## 2024-05-10 NOTE — Op Note (Signed)
 Flexible and EBUS Bronchoscopy Procedure Note  Timothy Matthews  [id number]  1946/09/23  Date:05/10/24  Time:3:14 PM   Provider Performing:Sequoya Hogsett   Procedure: Flexible bronchoscopy and EBUS Bronchoscopy  Indication(s) RUL cavitary lesion  Consent Risks of the procedure as well as the alternatives and risks of each were explained to the patient and/or caregiver.  Consent for the procedure was obtained.  Anesthesia General Anesthesia   Time Out Verified patient identification, verified procedure, site/side was marked, verified correct patient position, special equipment/implants available, medications/allergies/relevant history reviewed, required imaging and test results available.   Sterile Technique Usual hand hygiene, masks, gowns, and gloves were used   Procedure Description Diagnostic bronchoscope advanced through endotracheal tube and into airway.  Airways were examined down to subsegmental level with findings noted below. There were copious secretions in the airway that were suctioned and therapeutically aspirated. Following diagnostic evaluation, endobronchial biopsies were obtained from the RUL endobronchial mass. We then performed a BAL in the RML, 70 mL of saline in, 45 mL out. The diagnostic bronchoscope was then removed and the EBUS bronchoscope was advanced into airway with stations 7 biopsied with a 21G Olympus ViziShot 2 needle and sent for slide and cytology. The EBUS bronchoscope was removed after assuring no active bleeding from biopsy site.  Findings:   There was a lesion occluding the RUL bronchus, concerning for malignancy/tumor extension vs granulation tissue without ability to pass through. This was irrigated with saline and then forceps were used for endobronchial biopsies.      EBUS findings: the RUL was evaluated by station 11R, 10R and 4R. There was a large confluence of tissue that was noted throughout. This was not biopsied.  We then  evaluated station 7 which was biopsied for slide and cytology.     Complications/Tolerance None; patient tolerated the procedure well. Chest X-ray is needed post procedure.   EBL Minimal   Specimen(s) BAL RML Endobronchial biopsies EBUS/TBNA to station 7  Belva November, MD [facility] 05/10/2024 3:20 PM

## 2024-05-10 NOTE — Transfer of Care (Signed)
 Immediate Anesthesia Transfer of Care Note  Patient: Timothy Matthews  Procedure(s) Performed: BRONCHOSCOPY, WITH EBUS (Bilateral)  Patient Location: PACU  Anesthesia Type:General  Level of Consciousness: awake  Airway & Oxygen Therapy: Patient Spontanous Breathing and Patient connected to face mask oxygen  Post-op Assessment: Report given to RN and Post -op Vital signs reviewed and stable  Post vital signs: Reviewed and stable  Last Vitals:  Vitals Value Taken Time  BP 114/73 05/10/24 15:22  Temp    Pulse 98 05/10/24 15:28  Resp 22 05/10/24 15:28  SpO2 88 % 05/10/24 15:28  Vitals shown include unfiled device data.  Last Pain:  Vitals:   05/10/24 1522  TempSrc:   PainSc: 0-No pain      Patients Stated Pain Goal: 0 (05/06/24 1232)  Complications: No notable events documented.

## 2024-05-10 NOTE — Plan of Care (Signed)

## 2024-05-10 NOTE — Progress Notes (Signed)
 Per Dr Arlon, dc tele monitoring

## 2024-05-11 ENCOUNTER — Encounter: Payer: Self-pay | Admitting: Student in an Organized Health Care Education/Training Program

## 2024-05-11 DIAGNOSIS — J984 Other disorders of lung: Secondary | ICD-10-CM | POA: Diagnosis not present

## 2024-05-11 DIAGNOSIS — J189 Pneumonia, unspecified organism: Secondary | ICD-10-CM | POA: Diagnosis not present

## 2024-05-11 LAB — BASIC METABOLIC PANEL WITH GFR
Anion gap: 11 (ref 5–15)
BUN: 31 mg/dL — ABNORMAL HIGH (ref 8–23)
CO2: 28 mmol/L (ref 22–32)
Calcium: 8.4 mg/dL — ABNORMAL LOW (ref 8.9–10.3)
Chloride: 96 mmol/L — ABNORMAL LOW (ref 98–111)
Creatinine, Ser: 0.99 mg/dL (ref 0.61–1.24)
GFR, Estimated: >60 mL/min (ref >60)
Glucose, Bld: 152 mg/dL — ABNORMAL HIGH (ref 70–99)
Potassium: 4.1 mmol/L (ref 3.5–5.1)
Sodium: 135 mmol/L (ref 135–145)

## 2024-05-11 LAB — CBC
HCT: 31.5 % — ABNORMAL LOW (ref 39.0–52.0)
Hemoglobin: 9.9 g/dL — ABNORMAL LOW (ref 13.0–17.0)
MCH: 25.8 pg — ABNORMAL LOW (ref 26.0–34.0)
MCHC: 31.4 g/dL (ref 30.0–36.0)
MCV: 82.2 fL (ref 80.0–100.0)
Platelets: 210 K/uL (ref 150–400)
RBC: 3.83 MIL/uL — ABNORMAL LOW (ref 4.22–5.81)
RDW: 20.8 % — ABNORMAL HIGH (ref 11.5–15.5)
WBC: 12.1 K/uL — ABNORMAL HIGH (ref 4.0–10.5)
nRBC: 0 % (ref 0.0–0.2)

## 2024-05-11 LAB — MAGNESIUM: Magnesium: 2.1 mg/dL (ref 1.7–2.4)

## 2024-05-11 NOTE — Progress Notes (Signed)
 Progress Note   Patient: Timothy Matthews FMW:[id number] DOB: Feb 18, 1946 DOA: 05/05/2024     6 DOS: the patient was seen and examined on 05/11/2024     Brief hospital course:   78 y.o. male with medical history significant for emphysema, cocaine abuse, GERD, hepatitis C, hypertension, prostate cancer, lung cancer,    PTSD and CVA, who presented to the emergency room with acute onset of worsening dyspnea with associated cough productive of thick whitish sputum as well as wheezing over the last week with no fever or chills.  Found to have exacerbation of his postobstructive pneumonia right upper lobe.   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  Complaining of worsening cough productive sputum on presentation.  Likely postobstructive secondary to mass with developing cavitary pneumonia.  Was supposed to receive bronch 6/16 however was canceled due to ongoing cocaine use.  Continue Unasyn  plus azithromycin .  MRSA swab negative so discontinued vancomycin .  Repeat CT imaging 7/27 noting progression and interval enlargement of cavitation.  Pulmonology consulted and underwent bronchoscopy with EBUS on 05/10/2024.  Concern for cavitating infectious process such as aspergillosis or fungal.  Continue to monitor closely   Acute COPD exacerbation - Exacerbated by above.  Wheezing and shortness of breath on exertion.  Continue orticosteroids, nebulizers, mucolytic's, antibiotics as above.   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  Likely exacerbated by IV fluids and antibiotic use.  No worsening shortness of breath.  Responded well to IV Lasix  x 1 yesterday.  Will continue scheduled IV Lasix .  Monitor urine output.  Recheck kidney function and monitor respiratory status  closely.     RUL lung adenocarcinoma - Reportedly is followed closely at the [facility]. status post chemoradiation.   History of CVA - Aspirin  on board   Peripheral neuropathy - Neurontin  on board.   BPH - Continue Flomax .   Noncompliance with medical regimen - History of leaving AMA.  [facility].  States he completed chemo/radiation.       Subjective:  Patient seen and examined at bedside this morning Still on intranasal oxygen with some evidence of shortness of breath Denied worsening chest pain abdominal pain nausea vomiting   Physical Exam:   GENERAL:  Alert, pleasant, no acute distress  HEENT:  EOMI, nasal cannula CARDIOVASCULAR:  RRR, no murmurs appreciated RESPIRATORY: Dry cough, poor air movement GASTROINTESTINAL:  Soft, nontender, nondistended EXTREMITIES: BL LE pitting edema NEURO:  No new focal deficits appreciated SKIN:  No rashes noted PSYCH:  Appropriate mood and affect        Data Reviewed:    Latest Ref Rng & Units 05/11/2024    4:43 AM 05/10/2024    5:21 AM 05/09/2024    2:13 AM  CBC  WBC 4.0 - 10.5 K/uL 12.1  10.4  10.3   Hemoglobin 13.0 - 17.0 g/dL 9.9  9.9  89.7   Hematocrit 39.0 - 52.0 % 31.5  31.2  32.3   Platelets 150 - 400 K/uL 210  200  195        Latest Ref Rng & Units 05/11/2024    4:43 AM 05/10/2024    5:21 AM 05/09/2024    2:13 AM  BMP  Glucose 70 - 99 mg/dL 847  894  891  BUN 8 - 23 mg/dL 31  25  27    Creatinine 0.61 - 1.24 mg/dL 9.00  9.11  9.05   Sodium 135 - 145 mmol/L 135  137  135   Potassium 3.5 - 5.1 mmol/L 4.1  3.9  4.1   Chloride 98 - 111 mmol/L 96  99  104   CO2 22 - 32 mmol/L 28  28  22    Calcium  8.9 - 10.3 mg/dL 8.4  8.6  8.3     Vitals:   05/11/24 0726 05/11/24 0759 05/11/24 1541 05/11/24 1609  BP:  134/83  124/76  Pulse:  82  84  Resp:      Temp:  98.3 F (36.8 C)  98 F (36.7 C)  TempSrc:  Oral  Oral  SpO2: 94% 96% 92% 95%  Weight:      Height:          Author: Drue ONEIDA Potter, MD 05/11/2024 5:42 PM  For on call review [email or web address].

## 2024-05-11 NOTE — Anesthesia Postprocedure Evaluation (Signed)
 Anesthesia Post Note  Patient: Timothy Matthews  Procedure(s) Performed: BRONCHOSCOPY, WITH EBUS (Bilateral)  Patient location during evaluation: PACU Anesthesia Type: General Level of consciousness: awake and alert Pain management: pain level controlled Vital Signs Assessment: post-procedure vital signs reviewed and stable Respiratory status: spontaneous breathing, nonlabored ventilation and respiratory function stable Cardiovascular status: blood pressure returned to baseline and stable Postop Assessment: no apparent nausea or vomiting Anesthetic complications: no   No notable events documented.   Last Vitals:  Vitals:   05/10/24 2358 05/11/24 0521  BP:  132/88  Pulse:  83  Resp:  17  Temp:  36.7 C  SpO2: 95% 97%    Last Pain:  Vitals:   05/11/24 0521  TempSrc: Oral  PainSc:                  Fairy POUR Elias Bordner

## 2024-05-11 NOTE — Plan of Care (Signed)

## 2024-05-11 NOTE — TOC Progression Note (Signed)
 Transition of Care [facility]) - Progression Note    Patient Details  Name: Timothy Matthews MRN: [id number] Date of Birth: June 02, 1946  Transition of Care [facility]) CM/SW Contact  Dalia GORMAN Fuse, RN Phone Number: 05/11/2024, 10:16 AM  Clinical Narrative:    Patient s/p bronchoscopy on yesterday for worsening RUL lesion; biopsies pending. Therapy evals pending.                      Expected Discharge Plan and Services                                               Social Drivers of Health (SDOH) Interventions SDOH Screenings   Food Insecurity: No Food Insecurity (05/06/2024)  Housing: Low Risk  (05/06/2024)  Transportation Needs: Unmet Transportation Needs (05/06/2024)  Utilities: Not At Risk (05/06/2024)  Social Connections: Moderately Integrated (05/06/2024)  Tobacco Use: High Risk (05/05/2024)    Readmission Risk Interventions     No data to display

## 2024-05-12 DIAGNOSIS — J984 Other disorders of lung: Secondary | ICD-10-CM | POA: Diagnosis not present

## 2024-05-12 DIAGNOSIS — J189 Pneumonia, unspecified organism: Secondary | ICD-10-CM | POA: Diagnosis not present

## 2024-05-12 LAB — CBC WITH DIFFERENTIAL/PLATELET
Abs Immature Granulocytes: 0.2 K/uL — ABNORMAL HIGH (ref 0.00–0.07)
Basophils Absolute: 0 K/uL (ref 0.0–0.1)
Basophils Relative: 0 %
Eosinophils Absolute: 0 K/uL (ref 0.0–0.5)
Eosinophils Relative: 0 %
HCT: 34 % — ABNORMAL LOW (ref 39.0–52.0)
Hemoglobin: 10.5 g/dL — ABNORMAL LOW (ref 13.0–17.0)
Immature Granulocytes: 2 %
Lymphocytes Relative: 8 %
Lymphs Abs: 0.8 K/uL (ref 0.7–4.0)
MCH: 25.5 pg — ABNORMAL LOW (ref 26.0–34.0)
MCHC: 30.9 g/dL (ref 30.0–36.0)
MCV: 82.5 fL (ref 80.0–100.0)
Monocytes Absolute: 0.8 K/uL (ref 0.1–1.0)
Monocytes Relative: 8 %
Neutro Abs: 8.7 K/uL — ABNORMAL HIGH (ref 1.7–7.7)
Neutrophils Relative %: 82 %
Platelets: 224 K/uL (ref 150–400)
RBC: 4.12 MIL/uL — ABNORMAL LOW (ref 4.22–5.81)
RDW: 20.8 % — ABNORMAL HIGH (ref 11.5–15.5)
WBC: 10.5 K/uL (ref 4.0–10.5)
nRBC: 0 % (ref 0.0–0.2)

## 2024-05-12 LAB — BASIC METABOLIC PANEL WITH GFR
Anion gap: 9 (ref 5–15)
BUN: 28 mg/dL — ABNORMAL HIGH (ref 8–23)
CO2: 29 mmol/L (ref 22–32)
Calcium: 8.4 mg/dL — ABNORMAL LOW (ref 8.9–10.3)
Chloride: 100 mmol/L (ref 98–111)
Creatinine, Ser: 0.88 mg/dL (ref 0.61–1.24)
GFR, Estimated: >60 mL/min (ref >60)
Glucose, Bld: 101 mg/dL — ABNORMAL HIGH (ref 70–99)
Potassium: 4.1 mmol/L (ref 3.5–5.1)
Sodium: 138 mmol/L (ref 135–145)

## 2024-05-12 LAB — SURGICAL PATHOLOGY

## 2024-05-12 LAB — CYTOLOGY - NON PAP

## 2024-05-12 NOTE — Progress Notes (Signed)
 Progress Note   Patient: Atsushi E Murray FMW:[id number] DOB: 1945/12/25 DOA: 05/05/2024     7 DOS: the patient was seen and examined on 05/12/2024     Brief hospital course:   78 y.o. male with medical history significant for emphysema, cocaine abuse, GERD, hepatitis C, hypertension, prostate cancer, lung cancer,    PTSD and CVA, who presented to the emergency room with acute onset of worsening dyspnea with associated cough productive of thick whitish sputum as well as wheezing over the last week with no fever or chills.  Found to have exacerbation of his postobstructive pneumonia right upper lobe.   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  Complaining of worsening cough productive sputum on presentation.  Likely postobstructive secondary to mass with developing cavitary pneumonia.  Was supposed to receive bronch 6/16 however was canceled due to ongoing cocaine use.  Continue Unasyn  plus azithromycin .  MRSA swab negative so discontinued vancomycin .  Repeat CT imaging 7/27 noting progression and interval enlargement of cavitation.  Pulmonology consulted and underwent bronchoscopy with EBUS on 05/10/2024.   Pathology results came back showing squamous cell carcinoma of the right upper lobe that is a potential explanation of the cavitary lesion Continue to monitor closely   Acute COPD exacerbation - Exacerbated by above.  Wheezing and shortness of breath on exertion.  Continue orticosteroids, nebulizers, mucolytic's, antibiotics as above.   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  Likely exacerbated by IV fluids and antibiotic use.  No worsening shortness of breath.  Will continue scheduled IV Lasix .  Monitor urine output.  Recheck kidney function and monitor  respiratory status closely.     RUL lung adenocarcinoma - Reportedly is followed closely at the [facility]. status post chemoradiation. Bronchoscopy done was done on 05/10/2024 showed findings of squamous cell carcinoma of the right upper lobe Pulmonologist following Patient will need follow-up with oncology   History of CVA - Aspirin  on board   Peripheral neuropathy - Neurontin  on board.   BPH - Continue Flomax .   Noncompliance with medical regimen - History of leaving AMA.  [facility].  States he completed chemo/radiation.       Subjective:  Patient seen and examined at bedside this morning Still requiring 4 L of intranasal oxygen Pathology results showing squamous cell carcinoma of the right upper lobe Pulmonary informed of the diagnosis   Physical Exam:   GENERAL: Alert and awake on intranasal oxygen HEENT:  EOMI, nasal cannula CARDIOVASCULAR:  RRR, no murmurs appreciated RESPIRATORY: Dry cough, poor air movement GASTROINTESTINAL:  Soft, nontender, nondistended EXTREMITIES: BL LE pitting edema NEURO:  No new focal deficits appreciated SKIN:  No rashes noted PSYCH:  Appropriate mood and affect        Data Reviewed:    Latest Ref Rng & Units 05/12/2024    4:59 AM 05/11/2024    4:43 AM 05/10/2024    5:21 AM  CBC  WBC 4.0 - 10.5 K/uL 10.5  12.1  10.4   Hemoglobin 13.0 - 17.0 g/dL 89.4  9.9  9.9   Hematocrit 39.0 - 52.0 % 34.0  31.5  31.2   Platelets 150 - 400 K/uL 224  210  200        Latest Ref Rng & Units 05/12/2024  4:59 AM 05/11/2024    4:43 AM 05/10/2024    5:21 AM  BMP  Glucose 70 - 99 mg/dL 898  847  894   BUN 8 - 23 mg/dL 28  31  25    Creatinine 0.61 - 1.24 mg/dL 9.11  9.00  9.11   Sodium 135 - 145 mmol/L 138  135  137   Potassium 3.5 - 5.1 mmol/L 4.1  4.1  3.9   Chloride 98 - 111 mmol/L 100  96  99   CO2 22 - 32 mmol/L 29  28  28    Calcium  8.9 - 10.3 mg/dL 8.4  8.4  8.6      Vitals:   05/11/24 2020  05/12/24 0426 05/12/24 0742 05/12/24 1545  BP: (!) 147/79 137/78 137/68 127/82  Pulse: 92 94 89 93  Resp: 18 18 15 16   Temp:  97.9 F (36.6 C) 99.7 F (37.6 C) 98 F (36.7 C)  TempSrc:   Oral   SpO2: 95% 94% 95% 96%  Weight:      Height:         Author: Drue ONEIDA Potter, MD 05/12/2024 6:03 PM  For on call review [email or web address].

## 2024-05-12 NOTE — TOC Progression Note (Signed)
 Transition of Care [facility]) - Progression Note    Patient Details  Name: Timothy Matthews MRN: [id number] Date of Birth: 1946-08-15  [facility]) CM/SW Contact  Dalia GORMAN Fuse, RN Phone Number: 05/12/2024, 11:23 AM  Clinical Narrative:     Continuing iv abx and iv lasix , brochoscopy concerning for infectious process such as aspergillosis or fungal. Plan to dc to home when medically ready.                     Expected Discharge Plan and Services                                               Social Drivers of Health (SDOH) Interventions SDOH Screenings   Food Insecurity: No Food Insecurity (05/06/2024)  Housing: Low Risk  (05/06/2024)  Transportation Needs: Unmet Transportation Needs (05/06/2024)  Utilities: Not At Risk (05/06/2024)  Social Connections: Moderately Integrated (05/06/2024)  Tobacco Use: High Risk (05/05/2024)    Readmission Risk Interventions     No data to display

## 2024-05-12 NOTE — Plan of Care (Signed)

## 2024-05-13 DIAGNOSIS — J441 Chronic obstructive pulmonary disease with (acute) exacerbation: Secondary | ICD-10-CM | POA: Diagnosis not present

## 2024-05-13 DIAGNOSIS — Z8673 Personal history of transient ischemic attack (TIA), and cerebral infarction without residual deficits: Secondary | ICD-10-CM | POA: Diagnosis not present

## 2024-05-13 DIAGNOSIS — C3491 Malignant neoplasm of unspecified part of right bronchus or lung: Secondary | ICD-10-CM

## 2024-05-13 DIAGNOSIS — Z515 Encounter for palliative care: Secondary | ICD-10-CM | POA: Diagnosis not present

## 2024-05-13 DIAGNOSIS — C3411 Malignant neoplasm of upper lobe, right bronchus or lung: Secondary | ICD-10-CM

## 2024-05-13 DIAGNOSIS — J984 Other disorders of lung: Secondary | ICD-10-CM | POA: Diagnosis not present

## 2024-05-13 DIAGNOSIS — J189 Pneumonia, unspecified organism: Secondary | ICD-10-CM | POA: Diagnosis not present

## 2024-05-13 DIAGNOSIS — Z7189 Other specified counseling: Secondary | ICD-10-CM | POA: Diagnosis not present

## 2024-05-13 LAB — CBC WITH DIFFERENTIAL/PLATELET
Abs Immature Granulocytes: 0.27 K/uL — ABNORMAL HIGH (ref 0.00–0.07)
Basophils Absolute: 0 K/uL (ref 0.0–0.1)
Basophils Relative: 0 %
Eosinophils Absolute: 0.1 K/uL (ref 0.0–0.5)
Eosinophils Relative: 1 %
HCT: 32.9 % — ABNORMAL LOW (ref 39.0–52.0)
Hemoglobin: 10.1 g/dL — ABNORMAL LOW (ref 13.0–17.0)
Immature Granulocytes: 2 %
Lymphocytes Relative: 8 %
Lymphs Abs: 1 K/uL (ref 0.7–4.0)
MCH: 25.4 pg — ABNORMAL LOW (ref 26.0–34.0)
MCHC: 30.7 g/dL (ref 30.0–36.0)
MCV: 82.7 fL (ref 80.0–100.0)
Monocytes Absolute: 0.9 K/uL (ref 0.1–1.0)
Monocytes Relative: 7 %
Neutro Abs: 9.8 K/uL — ABNORMAL HIGH (ref 1.7–7.7)
Neutrophils Relative %: 82 %
Platelets: 224 K/uL (ref 150–400)
RBC: 3.98 MIL/uL — ABNORMAL LOW (ref 4.22–5.81)
RDW: 20.5 % — ABNORMAL HIGH (ref 11.5–15.5)
WBC: 12 K/uL — ABNORMAL HIGH (ref 4.0–10.5)
nRBC: 0 % (ref 0.0–0.2)

## 2024-05-13 LAB — BASIC METABOLIC PANEL WITH GFR
Anion gap: 9 (ref 5–15)
BUN: 27 mg/dL — ABNORMAL HIGH (ref 8–23)
CO2: 28 mmol/L (ref 22–32)
Calcium: 8.2 mg/dL — ABNORMAL LOW (ref 8.9–10.3)
Chloride: 98 mmol/L (ref 98–111)
Creatinine, Ser: 0.84 mg/dL (ref 0.61–1.24)
GFR, Estimated: >60 mL/min (ref >60)
Glucose, Bld: 82 mg/dL (ref 70–99)
Potassium: 4.3 mmol/L (ref 3.5–5.1)
Sodium: 135 mmol/L (ref 135–145)

## 2024-05-13 LAB — ACID FAST SMEAR (AFB, MYCOBACTERIA): Acid Fast Smear: NEGATIVE

## 2024-05-13 LAB — CULTURE, RESPIRATORY W GRAM STAIN: Culture: NORMAL

## 2024-05-13 MED ORDER — BUDESON-GLYCOPYRROL-FORMOTEROL 160-9-4.8 MCG/ACT IN AERO
2.0000 | INHALATION_SPRAY | Freq: Two times a day (BID) | RESPIRATORY_TRACT | Status: DC
  Administered 2024-05-13 – 2024-05-20 (×14): 2 via RESPIRATORY_TRACT
  Filled 2024-05-13: qty 5.9
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
## 2024-05-13 NOTE — Consult Note (Signed)
 Consultation Note Date: 05/13/2024   Patient Name: Timothy Matthews  DOB: 05/31/1946  MRN: [id number]  Age / Sex: 78 y.o., male  PCP: Dorise Sonny Bradley, MD Referring Physician: Dorinda Drue DASEN, MD  Reason for Consultation: Establishing goals of care   HPI/Brief Hospital Course: 78 y.o. male  with past medical history of emphysema, polysubstance abuse, hepatitis C, hypertension, history of prostate cancer, CVA, PTSD and recent diagnosis [facility] and chemotherapy admitted on 05/05/2024 with worsening shortness of breath.  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  Subjective:  Extensive chart review has been completed prior to meeting patient including labs, vital signs, imaging, progress notes, orders, and available advanced directive documents from current and previous encounters.  Visited with Mr. Timothy Matthews at his bedside.  He is awake, alert, sitting up in recliner and able to answer orientation questions appropriately.  He is somewhat difficult to understand and soft spoken.  No family or visitors at bedside during time of visit.  Introduced myself as a Publishing rights manager as a member of the palliative care team. Explained palliative medicine is specialized medical care for people living with serious illness. It focuses on providing relief from the symptoms and stress of a serious illness. The goal is to improve quality of life for both the patient and the family.   [name] shares he is currently separated from his Matthews-Timothy Matthews, although they remain in touch on a regular basis.  He shares Timothy Matthews was recently hospitalized and is currently residing in a rehab  facility.  He shares he himself does not have any biological children.  Reviewed his contact list, he shares [name] is a friend that lives nearby that comes and checks in on him I am frequently.  Randine cooks and cleans for him.  He shares prior to admission he was able to independently perform all ADLs.  He shares his niece, Timothy Matthews has been contacted and she is planning to visit the hospital over the weekend.  He shares Timothy Matthews is his only family that he can be in contact with at this time due to his Matthews being in a rehab facility.  We discussed patient's current illness and what it means in the larger context of patient's on-going co-morbidities. Natural disease trajectory and expectations at EOL were discussed.   [name] shares his understanding of recent findings, biopsy results revealing squamous cell carcinoma.  He shares he has an appointment to follow-up with his oncologist at the [facility] on 8/5, he shares he remains interested in oncological follow-up, workup and any treatment/interventions available.  He is unaware of his last follow-up visit or plans that were made for follow-up or treatment.  He shares he typically takes [facility] transportation to [facility].  Assessed symptoms.  He is visibly short of breath during our conversations.  He reports he feels this is his baseline and has been his baseline for the past several months.  He denies increase in shortness of breath from transfer from bed to chair.  He denies acute pain or discomfort.  He reports sleeping well at night.  He reports no recent change in his appetite although it has been poor over the last several weeks.  Attempted to elicit goals of care.  We discussed CODE STATUS and the difference between full code and DO NOT RESUSCITATE. Mr. Un is clear at this time he wishes to remain full code and is open to any and all interventions to preserve life.  He is hopeful to get to the [facility] for his follow-up appointment and  make a plan to continue/pursue treatment or interventions.  Returned to bedside to meet with [name]-niece.  Timothy Matthews shares she has not had contact with her uncle since 2021.  She does not live local to the area traveled a few hours to be able to visit.  Timothy Matthews shares [name] Matthews-Timothy Matthews suffered a stroke sometime ago she is unaware if Timothy Matthews is currently in short-term rehab or resides now in long-term care.  Timothy Matthews has a minimal contact with Timothy children.  Timothy Matthews confirms [name] does not have any biological children and that he and Timothy Matthews are separated.  Timothy Matthews shares he has 1 brother who lives in [place]  that has had minimal contact over the years.  Timothy Matthews shares her plan is to learn more about Timothy condition and if she is able to participate in goals of care and providing care to [name] at this time. Timothy Matthews will also be in contact with her uncle in [place]  to see if he would like to be involved in goals of care.  During our conversation, [name] from oncology joined.  Limited information regarding previous treatments from [facility].  Will need further testing prior to establishing plan for treatment/interventions.  Discussions were had regarding his current condition and his ability to undergo treatment.  Brief discussions also had regarding comfort care/hospice.  Timothy Matthews shares at this time she feels it is best for [name] to seek LTC placement-potentially closer to [facility] and his [facility] providers. Mr. Brecht shares he recently lost his wallet and no longer has a valid ID, Medicare ID card or VA ID card.  Timothy Matthews is aware that [name] needs guidance and support moving forward and assistance with goals of care decision making. Timothy Matthews will attempt to get more information about family members. She and [name] agree for Timothy Matthews to be point of contact at this time.  I discussed importance of continued conversations with family/support persons and all members of their medical  team regarding overall plan of care and treatment options ensuring decisions are in alignment with patients goals of care.  All questions/concerns addressed. Emotional support provided to patient/family/support persons. PMT will continue to follow and support patient as needed.   Objective: Primary Diagnoses: Present on Admission:  COPD exacerbation (HCC)   Physical Exam Constitutional:      General: He is not in acute distress.    Appearance: He is ill-appearing.  Pulmonary:     Effort: Tachypnea present.  Skin:    General: Skin is warm and dry.  Neurological:     Mental Status: He is alert and oriented to person, place, and time.     Motor: Weakness present.  Psychiatric:        Mood and Affect: Mood normal.        Behavior: Behavior normal.        Thought Content: Thought content normal.     Vital Signs: BP 119/66 (BP Location: Left Arm)   Pulse 87   Temp 98.4 F (36.9 C) (Oral)   Resp 18   Ht 5' 10 (1.778 m)   Wt 72.3 kg   SpO2 99%   BMI 22.87 kg/m  Pain Scale: 0-10  Pain Score: 0-No pain  IO: Intake/output summary:  Intake/Output Summary (Last 24 hours) at 05/13/2024 1728 Last data filed at 05/13/2024 1722 Gross per 24 hour  Intake 1990 ml  Output 2150 ml  Net -160 ml    LBM: Last BM Date : 05/12/24 Baseline Weight: Weight: 72.3 kg Most recent weight: Weight: 72.3 kg       Palliative Assessment/Data:   Assessment and Plan  SUMMARY OF RECOMMENDATIONS   Needs placement Ongoing GOC to be had PMT to continue to follow for ongoing needs and support  Palliative Prophylaxis:   Bowel Regimen, Delirium Protocol and Frequent Pain Assessment   Thank you for this consult and allowing Palliative Medicine to participate in the care of Dravin E. Lipford. Palliative medicine will continue to follow and assist as needed.   Time Total: 90 minutes  Time spent includes: Detailed review of medical records (labs, imaging, vital signs), medically appropriate  exam (mental status, respiratory, cardiac, skin), discussed with treatment team, counseling and educating patient, family and staff, documenting clinical information, medication management and coordination of care.   Signed by: Waddell Lesches, DNP, AGNP-C Palliative Medicine    Please contact Palliative Medicine Team phone at [phone] for questions and concerns.  For individual provider: See Tracey

## 2024-05-13 NOTE — Progress Notes (Signed)
 NAME:  Timothy Matthews, MRN:  [id number], DOB:  12-22-1945, LOS: 8 ADMISSION DATE:  05/05/2024, CONSULTATION DATE:  05/09/2024 REFERRING MD:  Dr. Carliss Canales, CHIEF COMPLAINT:  Shbortness of breath   History of Present Illness:  Timothy Matthews is a 78 year old male patient with a past medical history of tobacco use, polysubstance use specifically smoking crack, recent diagnosis of right upper lobe lung cancer being managed at the [facility] no documentation on file presenting to [facility] on 06/13 with ongoing shortness of breath and cough with phlegm for months. Pulmonary team is being consulted for help with further evaluation.    He was admitted last month with worsening shortness of breath. CT chest at the time showed RUL cavitary lesion with increased cosolidative opacification and interlobular septal thickening. Managed as penumonia and discharge home on levofloxacin . Sputum culture at the time with normal flora.    He also reports that he he is being managed for lung cancer at the [facility] and his last radiation therapy was in March 2025 and his last chemotherapy was at the same time and only received 3 cycles. Unclear if he continues to follow-up and he is compliant with follow-ups.    He comes back now with worsening shortness of breath on exertion, worsening lower extremities swelling. CT chest 07/28 with worsening right upper lobe cavitary lesion and now consolidative opacity involving the right middle lobe and right lower lobe.    He has been on unasyn  and azithromycin  as well as prednisone  40 for the last 3 days with reported clinical improvement.   Pertinent  Medical History  As above.   Interim History / Subjective:  Patient was seen and evaluated by the pulmonary team.  He is s/p Bronch, EBUS and TBNA , found to have RUL endobronchial lesion biopsied. Consistent with squamous cell carcinoma.  Diagnosis relayed to Timothy Matthews.  Report breathing is still the same.   Objective    Blood pressure  128/79, pulse 87, temperature 98.3 F (36.8 C), resp. rate 18, height 5' 10 (1.778 m), weight 72.3 kg, SpO2 95%.        Intake/Output Summary (Last 24 hours) at 05/13/2024 0907 Last data filed at 05/13/2024 0445 Gross per 24 hour  Intake 880 ml  Output 1700 ml  Net -820 ml   Filed Weights   05/05/24 2035  Weight: 72.3 kg    Examination: General: NAD  HENT: Supple neck, reactive pupils, poor dental hygiene Lungs: Diffuse crackles appreciated again Cardiovascular: Normal S1, Normal S2, RRR Abdomen: Soft non tender, non distended Extremities: Warm and well perfused, digit clubbing noted.   Labs and imaging were reviewed.  Assessment and Plan  Timothy Matthews is a 78 year old male patient with a past medical history of tobacco use, polysubstance use specifically smoking crack, recent diagnosis of right upper lobe lung cancer being managed at the [facility] no documentation on file presenting to [facility] on 06/13 with ongoing shortness of breath and cough with phlegm for months. Pulmonary team is being consulted for help with further evaluation.   RUL cavitary lesion is representing progressive squamous cell carcinoma. Bronchoscopy on 07/29 showed an endobronchial RUL lesion that was biopsied and consistent with SCC. Not sure of treatment done at the [facility] and of compliance to be honest.   []  Recommend engaging heme/Onc. []  Completed 7 days of Unasyn , ok to dc. Pending bal cultutre and gram stain.  []  Diuresis per primary team.  []  I am sure he is chronically hypoxic at least on  exertion based on significant digital clubbing and will need home oxygen.  []  Start Breztri 2 puffs bid inpatient and discharge on that.  Flutter valve to help with airway clearance.   Thank you for allowing us  to take part in Timothy Matthews's care.  Pulmonary team will sign off.  Please do not hesitate to reach out with any concerns.   I spent 50 minutes caring for this patient today, including preparing to see the patient,  obtaining a medical history , reviewing a separately obtained history, performing a medically appropriate examination and/or evaluation, counseling and educating the patient/family/caregiver, ordering medications, tests, or procedures, documenting clinical information in the electronic health record, and independently interpreting results (not separately reported/billed) and communicating results to the patient/family/caregiver  Darrin Barn, MD [facility] 05/13/2024 9:16 AM

## 2024-05-13 NOTE — Consult Note (Signed)
 Hematology/Oncology Consult note [phone] [phone]      Patient Care Team: Dorise Sonny Bradley, MD as PCP - General (Internal Medicine)   Name of the patient: Timothy Matthews  [id number]  [birth year]   REASON FOR COSULTATION:  Lung cancer History of presenting illness-  78 y.o. male with history of cocaine abuse, GERD, hepatitis C, hypertension, prostate cancer status post treatment in 2016, lung fibrosis, lung cancer, stroke who is currently admitted due to cavitary pneumonia. patient has had multiple admissions due to postobstructive pneumonia in the past few months.  He was treated for recurrent necrotizing pneumonia and the symptoms will transiently improve however recurred. Patient is a very poor historian.  He reported history of lung cancer being treated at the [facility]. Extensive record review was performed via Care everywhere. 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  Incomplete weekly carboplatin Taxol treatments.  Patient lost to hematology oncology and radiation oncology follow-up in March 2025. Patient was seen by pulmonology at the [facility] on 04/03/2024.  There was discussion about starting immunotherapy as maintenance for him however given his pulmonary fibrosis, risk of lung toxicity was weighed against potential benefits and this was ultimately not pursued.  Patient also has recurrent postobstructive pneumonia and stenting to prevent postobstructive pneumonia was entertained and ultimately not pursued given his more distal disease.  05/08/2024, CT chest with contrast showed extensive and enlarging cavitary process in the right upper lobe with surrounding consolidation and fluid level within the dominant cavity. Likely other cavities along with varicoid and cystic bronchiectasis in the right upper lobe anteriorly, increased from previous. Consolidation in the  superior segment right lower lobe and in upper portions of the right middle lobe with emphysema and coarse interstitial accentuation through most of the remaining right lower lobe. Suspected pulmonary fibrosis and peripheral honeycombing.   05/10/2024, patient underwent bronchoscopy and a biopsy of the right upper lobe lesion.  Pathology showed moderately differentiated squamous cell carcinoma with keratinization.  Extensive background necrosis.  Oncology was consulted for further evaluation.  Patient's niece [name] is at the bedside.  Patient has poor family support.  Separated from his wife.  Allergies  Allergen Reactions   Trospium     Other Reaction(s): Blurring of visual image    Patient Active Problem List   Diagnosis Date Noted   Postobstructive pneumonia 05/09/2024   Dyslipidemia 05/06/2024   BPH (benign prostatic hyperplasia) 05/06/2024   Peripheral neuropathy 05/06/2024   History of CVA (cerebrovascular accident) 05/06/2024   Essential hypertension 05/06/2024   Cavitary pneumonia 05/05/2024   COPD exacerbation (HCC) 04/20/2024   Necrotizing pneumonia (HCC) 03/28/2024   Cancer of right lung (HCC) 03/25/2024   Community acquired pneumonia 03/25/2024   Cocaine abuse with cocaine-induced mood disorder (HCC) 09/04/2022   Anxiety state 09/02/2022   Medication reaction 09/02/2022   CVA (cerebral vascular accident) (HCC) 10/23/2020   Cocaine abuse (HCC) 10/23/2020   HTN (hypertension) 10/23/2020   Emphysema lung (HCC) 10/23/2020   GERD (gastroesophageal reflux disease) 10/23/2020     Past Medical History:  Diagnosis Date   Cancer (HCC)    Cocaine abuse (HCC)    last ED encounter 04/07/20   Difficult intubation    Emphysema lung (HCC)    pan-lobar by CT 2017   GERD (gastroesophageal reflux disease)    Hepatitis C    Hypertension    Prostate cancer (HCC) BPH   PTSD (post-traumatic stress  disorder)      Past Surgical History:  Procedure Laterality Date   CERVICAL  SPINE SURGERY  2017   Done at [facility] SPINE SURGERY     TEE WITHOUT CARDIOVERSION N/A 10/26/2020   Procedure: TRANSESOPHAGEAL ECHOCARDIOGRAM (TEE);  Surgeon: Bosie Vinie LABOR, MD;  Location: [facility] ORS;  Service: Cardiovascular;  Laterality: N/A;   TONSILLECTOMY     TRANSURETHRAL RESECTION OF PROSTATE     VIDEO BRONCHOSCOPY WITH ENDOBRONCHIAL ULTRASOUND Bilateral 05/10/2024   Procedure: BRONCHOSCOPY, WITH EBUS;  Surgeon: Isadora Hose, MD;  Location: [facility] ORS;  Service: Pulmonary;  Laterality: Bilateral;    Social History   Socioeconomic History   Marital status: Legally Separated    Spouse name: Not on file   Number of children: Not on file   Years of education: Not on file   Highest education level: Not on file  Occupational History   Not on file  Tobacco Use   Smoking status: Every Day    Types: Cigarettes   Smokeless tobacco: Never  Vaping Use   Vaping status: Never Used  Substance and Sexual Activity   Alcohol  use: Not Currently   Drug use: Yes    Types: Cocaine, Marijuana   Sexual activity: Not Currently    Partners: Female  Other Topics Concern   Not on file  Social History Narrative   Not on file   Social Drivers of Health   Financial Resource Strain: Not on file  Food Insecurity: No Food Insecurity (05/06/2024)   Hunger Vital Sign    Worried About Running Out of Food in the Last Year: Never true    Ran Out of Food in the Last Year: Never true  Transportation Needs: Unmet Transportation Needs (05/06/2024)   PRAPARE - Administrator, Civil Service (Medical): Yes    Lack of Transportation (Non-Medical): Yes  Physical Activity: Not on file  Stress: Not on file  Social Connections: Moderately Integrated (05/06/2024)   Social Connection and Isolation Panel    Frequency of Communication with Friends and Family: More than three times a week    Frequency of Social Gatherings with Friends and Family: More than three times a week    Attends Religious  Services: Patient declined    Database administrator or Organizations: Yes    Attends Banker Meetings: Never    Marital Status: Married  Catering manager Violence: Not At Risk (05/06/2024)   Humiliation, Afraid, Rape, and Kick questionnaire    Fear of Current or Ex-Partner: No    Emotionally Abused: No    Physically Abused: No    Sexually Abused: No     Family History  Problem Relation Age of Onset   Breast cancer Mother    Diabetes Mother    Glaucoma Mother    Cancer Father    Alcohol  abuse Father    Pancreatic cancer Father      Current Facility-Administered Medications:    acetaminophen  (TYLENOL ) tablet 650 mg, 650 mg, Oral, Q6H PRN **OR** acetaminophen  (TYLENOL ) suppository 650 mg, 650 mg, Rectal, Q6H PRN, Mansy, Jan A, MD   Ampicillin -Sulbactam (UNASYN ) 3 g in sodium chloride  0.9 % 100 mL IVPB, 3 g, Intravenous, Q6H, Calkins, Derek W, DO, Last Rate: 200 mL/hr at 05/13/24 1213, 3 g at 05/13/24 1213   atorvastatin  (LIPITOR) tablet 40 mg, 40 mg, Oral, Daily, Mansy, Jan A, MD, 40 mg at 05/13/24 9097   baclofen  (LIORESAL ) tablet 5 mg, 5 mg, Oral, BID,  Arlon Carliss ORN, DO, 5 mg at 05/13/24 9096   benzonatate  (TESSALON ) capsule 100 mg, 100 mg, Oral, BID, Arlon Carliss ORN, DO, 100 mg at 05/13/24 9096   budesonide -glycopyrrolate-formoterol  (BREZTRI) 160-9-4.8 MCG/ACT inhaler 2 puff, 2 puff, Inhalation, BID, Assaker, Darrin, MD, 2 puff at 05/13/24 1159   chlorpheniramine-HYDROcodone  (TUSSIONEX) 10-8 MG/5ML suspension 5 mL, 5 mL, Oral, Q12H PRN, Mansy, Jan A, MD, 5 mL at 05/13/24 1528   enoxaparin  (LOVENOX ) injection 40 mg, 40 mg, Subcutaneous, Q24H, Mansy, Jan A, MD, 40 mg at 05/12/24 0805   furosemide  (LASIX ) injection 40 mg, 40 mg, Intravenous, Daily, Arlon Carliss W, DO, 40 mg at 05/13/24 9097   gabapentin  (NEURONTIN ) capsule 600 mg, 600 mg, Oral, TID, Mansy, Jan A, MD, 600 mg at 05/13/24 9096   guaiFENesin  (MUCINEX ) 12 hr tablet 600 mg, 600 mg, Oral, BID, Mansy,  Jan A, MD, 600 mg at 05/13/24 9096   ipratropium-albuterol  (DUONEB) 0.5-2.5 (3) MG/3ML nebulizer solution 3 mL, 3 mL, Nebulization, Q4H PRN, Dail Rankin RAMAN, RPH, 3 mL at 05/13/24 1606   magnesium  hydroxide (MILK OF MAGNESIA) suspension 30 mL, 30 mL, Oral, Daily PRN, Mansy, Jan A, MD, 30 mL at 05/12/24 2052   melatonin tablet 5 mg, 5 mg, Oral, QHS PRN, Mansy, Jan A, MD, 5 mg at 05/08/24 2005   ondansetron  (ZOFRAN ) tablet 4 mg, 4 mg, Oral, Q6H PRN **OR** ondansetron  (ZOFRAN ) injection 4 mg, 4 mg, Intravenous, Q6H PRN, Mansy, Jan A, MD   Oral care mouth rinse, 15 mL, Mouth Rinse, PRN, Arlon, Derek W, DO   tamsulosin  (FLOMAX ) capsule 0.4 mg, 0.4 mg, Oral, QHS, Calkins, Derek W, DO, 0.4 mg at 05/11/24 2129   traZODone  (DESYREL ) tablet 25 mg, 25 mg, Oral, QHS PRN, Mansy, Jan A, MD, 25 mg at 05/09/24 2131  Review of Systems  Constitutional:  Positive for appetite change, fatigue and unexpected weight change. Negative for chills and fever.  HENT:   Negative for hearing loss and voice change.   Eyes:  Negative for eye problems and icterus.  Respiratory:  Positive for cough and shortness of breath. Negative for chest tightness.   Cardiovascular:  Negative for chest pain and leg swelling.  Gastrointestinal:  Negative for abdominal distention and abdominal pain.  Endocrine: Negative for hot flashes.  Genitourinary:  Negative for difficulty urinating, dysuria and frequency.   Musculoskeletal:  Negative for arthralgias.  Skin:  Negative for itching and rash.  Neurological:  Negative for light-headedness and numbness.  Hematological:  Negative for adenopathy. Does not bruise/bleed easily.  Psychiatric/Behavioral:  Negative for confusion.     PHYSICAL EXAM Vitals:   05/12/24 2059 05/13/24 0509 05/13/24 0901 05/13/24 1559  BP: (!) 143/71 130/72 128/79 119/66  Pulse: 91 78 87 87  Resp: (!) 22 18 18 18   Temp: 97.9 F (36.6 C) 98.4 F (36.9 C) 98.3 F (36.8 C) 98.4 F (36.9 C)  TempSrc:    Oral   SpO2: 94% 96% 95% 99%  Weight:      Height:       Physical Exam Constitutional:      Appearance: He is not diaphoretic.  HENT:     Head: Normocephalic and atraumatic.  Eyes:     General: No scleral icterus. Cardiovascular:     Rate and Rhythm: Normal rate and regular rhythm.     Heart sounds: No murmur heard. Pulmonary:     Effort: Pulmonary effort is normal. No respiratory distress.     Comments: Poor air entry bilaterally.  Diffuse crackles Abdominal:     General: There is no distension.     Palpations: Abdomen is soft.  Musculoskeletal:        General: Normal range of motion.     Right lower leg: Edema present.     Left lower leg: Edema present.  Skin:    Findings: No erythema.  Neurological:     Mental Status: He is alert and oriented to person, place, and time. Mental status is at baseline.     Motor: No abnormal muscle tone.  Psychiatric:        Mood and Affect: Mood and affect normal.       LABORATORY STUDIES    Latest Ref Rng & Units 05/13/2024    2:49 AM 05/12/2024    4:59 AM 05/11/2024    4:43 AM  CBC  WBC 4.0 - 10.5 K/uL 12.0  10.5  12.1   Hemoglobin 13.0 - 17.0 g/dL 89.8  89.4  9.9   Hematocrit 39.0 - 52.0 % 32.9  34.0  31.5   Platelets 150 - 400 K/uL 224  224  210       Latest Ref Rng & Units 05/13/2024    2:49 AM 05/12/2024    4:59 AM 05/11/2024    4:43 AM  CMP  Glucose 70 - 99 mg/dL 82  898  847   BUN 8 - 23 mg/dL 27  28  31    Creatinine 0.61 - 1.24 mg/dL 9.15  9.11  9.00   Sodium 135 - 145 mmol/L 135  138  135   Potassium 3.5 - 5.1 mmol/L 4.3  4.1  4.1   Chloride 98 - 111 mmol/L 98  100  96   CO2 22 - 32 mmol/L 28  29  28    Calcium  8.9 - 10.3 mg/dL 8.2  8.4  8.4      RADIOGRAPHIC STUDIES: I have personally reviewed the radiological images as listed and agreed with the findings in the report. DG Chest Port 1 View Result Date: 05/10/2024 CLINICAL DATA:  Status post bronchoscopy skip chest x-ray 05/05/2024. EXAM: PORTABLE CHEST 1 VIEW  COMPARISON:  CT of the chest 05/08/2024.  Chest x-ray 05/05/2024. FINDINGS: Complete opacification of the right mid and upper lung with cavitary lesion again seen. The there are increasing diffuse interstitial opacities bilaterally. Costophrenic angles are clear. No pneumothorax visualized. Cardiomediastinal silhouette is within normal limits. The osseous structures are stable. IMPRESSION: 1. Complete opacification of the right mid and upper lung with cavitary lesion again seen. 2. Increasing diffuse interstitial opacities bilaterally worrisome for edema. Electronically Signed   By: Greig Pique M.D.   On: 05/10/2024 16:59   ECHOCARDIOGRAM COMPLETE Result Date: 05/10/2024    ECHOCARDIOGRAM REPORT   Patient Name:   IRBIN FINES Date of Exam: 05/10/2024 Medical Rec #:  [id number]         Height:       70.0 in Accession #:    [id number]        Weight:       159.4 lb Date of Birth:  11/10/1945          BSA:          1.895 m Patient Age:    77 years          BP:           142/90 mmHg Patient Gender: M                 HR:  81 bpm. Exam Location:  [facility] Procedure: 2D Echo, Cardiac Doppler, Color Doppler and Strain Analysis (Both            Spectral and Color Flow Doppler were utilized during procedure). Indications:     Congestive heart failure I50.9  History:         Patient has prior history of Echocardiogram examinations, most                  recent 10/24/2020. Risk Factors:Hypertension. Emphysema lung.  Sonographer:     Christopher Furnace Referring Phys:  [id number] DARRIN BARN Diagnosing Phys: Deatrice Cage MD  Sonographer Comments: Global longitudinal strain was attempted. IMPRESSIONS  1. Left ventricular ejection fraction, by estimation, is 60 to 65%. The left ventricle has normal function. The left ventricle has no regional wall motion abnormalities. There is moderate left ventricular hypertrophy. Left ventricular diastolic parameters are consistent with Grade I diastolic dysfunction (impaired  relaxation).  2. Right ventricular systolic function is normal. The right ventricular size is normal. Tricuspid regurgitation signal is inadequate for assessing PA pressure.  3. Left atrial size was mildly dilated.  4. The mitral valve is normal in structure. No evidence of mitral valve regurgitation. No evidence of mitral stenosis.  5. The aortic valve is tricuspid. Aortic valve regurgitation is not visualized. Aortic valve sclerosis/calcification is present, without any evidence of aortic stenosis. FINDINGS  Left Ventricle: Left ventricular ejection fraction, by estimation, is 60 to 65%. The left ventricle has normal function. The left ventricle has no regional wall motion abnormalities. Strain was performed and the global longitudinal strain is indeterminate. Global longitudinal strain performed but not reported based on interpreter judgement due to suboptimal tracking. The left ventricular internal cavity size was normal in size. There is moderate left ventricular hypertrophy. Left ventricular  diastolic parameters are consistent with Grade I diastolic dysfunction (impaired relaxation). Right Ventricle: The right ventricular size is normal. No increase in right ventricular wall thickness. Right ventricular systolic function is normal. Tricuspid regurgitation signal is inadequate for assessing PA pressure. Left Atrium: Left atrial size was mildly dilated. Right Atrium: Right atrial size was normal in size. Pericardium: There is no evidence of pericardial effusion. Mitral Valve: The mitral valve is normal in structure. No evidence of mitral valve regurgitation. No evidence of mitral valve stenosis. MV peak gradient, 4.4 mmHg. The mean mitral valve gradient is 2.0 mmHg. Tricuspid Valve: The tricuspid valve is normal in structure. Tricuspid valve regurgitation is not demonstrated. No evidence of tricuspid stenosis. Aortic Valve: The aortic valve is tricuspid. Aortic valve regurgitation is not visualized. Aortic  valve sclerosis/calcification is present, without any evidence of aortic stenosis. Aortic valve mean gradient measures 3.5 mmHg. Aortic valve peak gradient measures 6.1 mmHg. Aortic valve area, by VTI measures 4.04 cm. Pulmonic Valve: The pulmonic valve was normal in structure. Pulmonic valve regurgitation is mild. No evidence of pulmonic stenosis. Aorta: The aortic root is normal in size and structure. Venous: The inferior vena cava was not well visualized. IAS/Shunts: No atrial level shunt detected by color flow Doppler.  LEFT VENTRICLE PLAX 2D LVIDd:         3.70 cm   Diastology LVIDs:         2.50 cm   LV e' medial:    7.18 cm/s LV PW:         1.40 cm   LV E/e' medial:  9.4 LV IVS:        1.40 cm   LV e' lateral:  9.25 cm/s LVOT diam:     2.30 cm   LV E/e' lateral: 7.3 LV SV:         76 LV SV Index:   40 LVOT Area:     4.15 cm  RIGHT VENTRICLE RV Basal diam:  2.40 cm RV Mid diam:    2.60 cm RV S prime:     16.00 cm/s TAPSE (M-mode): 2.1 cm LEFT ATRIUM             Index        RIGHT ATRIUM          Index LA diam:        4.80 cm 2.53 cm/m   RA Area:     6.42 cm LA Vol (A2C):   31.2 ml 16.46 ml/m  RA Volume:   9.67 ml  5.10 ml/m LA Vol (A4C):   26.3 ml 13.88 ml/m LA Biplane Vol: 31.1 ml 16.41 ml/m  AORTIC VALVE AV Area (Vmax):    3.63 cm AV Area (Vmean):   3.37 cm AV Area (VTI):     4.04 cm AV Vmax:           123.50 cm/s AV Vmean:          82.950 cm/s AV VTI:            0.187 m AV Peak Grad:      6.1 mmHg AV Mean Grad:      3.5 mmHg LVOT Vmax:         108.00 cm/s LVOT Vmean:        67.300 cm/s LVOT VTI:          0.182 m LVOT/AV VTI ratio: 0.97  AORTA Ao Root diam: 3.20 cm MITRAL VALVE                TRICUSPID VALVE MV Area (PHT): 2.91 cm     TR Peak grad:   10.8 mmHg MV Area VTI:   3.11 cm     TR Vmax:        164.00 cm/s MV Peak grad:  4.4 mmHg MV Mean grad:  2.0 mmHg     SHUNTS MV Vmax:       1.05 m/s     Systemic VTI:  0.18 m MV Vmean:      64.4 cm/s    Systemic Diam: 2.30 cm MV Decel Time: 261 msec  MV E velocity: 67.60 cm/s MV A velocity: 106.00 cm/s MV E/A ratio:  0.64 Deatrice Cage MD Electronically signed by Deatrice Cage MD Signature Date/Time: 05/10/2024/3:00:56 PM    Final    CT CHEST W CONTRAST Result Date: 05/08/2024 CLINICAL DATA:  Pleural mass EXAM: CT CHEST WITH CONTRAST TECHNIQUE: Multidetector CT imaging of the chest was performed during intravenous contrast administration. RADIATION DOSE REDUCTION: This exam was performed according to the departmental dose-optimization program which includes automated exposure control, adjustment of the mA and/or kV according to patient size and/or use of iterative reconstruction technique. CONTRAST:  75mL OMNIPAQUE  IOHEXOL  300 MG/ML  SOLN COMPARISON:  03/25/2024 FINDINGS: Cardiovascular: Aortic atherosclerosis.  Mild cardiomegaly. Mediastinum/Nodes: Likely reactive small subcarinal lymph nodes. Lungs/Pleura: Extensive cavitary process in the right upper lobe with surrounding consolidation and fluid-fluid level within the dominant cavity. Likely other cavities along with varicoid and cystic bronchiectasis in the right upper lobe anteriorly, increased from previous. Dominant cavity about 7.3 by 5.4 cm on image 44 series 2, formerly 5.7 by 4.2 cm. Consolidation in the superior segment right lower  lobe and in upper portions of the right middle lobe with emphysema and coarse interstitial accentuation through most of the remaining right lower lobe. Suspected pulmonary fibrosis. Peripheral honeycombing, right greater than left. Upper Abdomen: Left kidney upper pole cyst posteriorly, fluid density on prior noncontrast CT. No further imaging workup of this lesion is indicated. Musculoskeletal: Lower cervical and upper thoracic fixation hardware. No right upper rib destruction identified. No obvious chest wall invasion of the right upper lobe process. Remote compression fractures at T11 and L1, unchanged. IMPRESSION: 1. Extensive and enlarging cavitary process in the  right upper lobe with surrounding consolidation and fluid-fluid level within the dominant cavity. Likely other cavities along with varicoid and cystic bronchiectasis in the right upper lobe anteriorly, increased from previous. Consolidation in the superior segment right lower lobe and in upper portions of the right middle lobe with emphysema and coarse interstitial accentuation through most of the remaining right lower lobe. Suspected pulmonary fibrosis and peripheral honeycombing. While infectious etiologies such as staphylococcal pneumonia or fungal pneumonia are likely, malignancy in the right upper lobe is not excluded. 2. Mild cardiomegaly. 3. Remote compression fractures at T11 and L1. 4. Aortic Atherosclerosis (ICD10-I70.0) and Emphysema (ICD10-J43.9). Electronically Signed   By: Ryan Salvage M.D.   On: 05/08/2024 15:20   DG Chest 2 View Result Date: 05/05/2024 EXAM: 2 VIEW(S) XRAY OF THE CHEST 05/05/2024 09:01:04 PM COMPARISON: 04/20/2024 CLINICAL HISTORY: SOB. PER ER NOTE; Pt reports shortness of breath that began a few months ago, pt reports he was seen for same earlier this month and left AMA. Pt has hx lung cancer, not currently receiving treatment. Pt also reports cough and congestion. FINDINGS: LUNGS AND PLEURA: Right upper lobe pneumonia, mildly progressive with increased cavitation. Underlying subpleural reticulation/fibrosis, reflecting chronic interstitial lung disease. No pleural effusion. No pneumothorax. HEART AND MEDIASTINUM: No acute abnormality of the cardiac and mediastinal silhouettes. BONES AND SOFT TISSUES: Cervicothoracic surgical hardware noted. No acute osseous abnormality. IMPRESSION: 1. Right upper lobe pneumonia, mildly progressive with increased cavitation. Electronically signed by: Pinkie Pebbles MD 05/05/2024 09:06 PM EDT RP Workstation: [id number]   DG Chest 2 View Result Date: 04/20/2024 CLINICAL DATA:  Shortness of breath and productive cough. EXAM: CHEST - 2 VIEW  COMPARISON:  April 19, 2024 FINDINGS: The heart size and mediastinal contours are within normal limits. Low lung volumes are noted with mild, stable elevation of the right hemidiaphragm and stable diffusely prominent interstitial lung markings. Stable airspace disease and adjacent area of cavitation is seen within the right upper lobe. Involvement of the adjacent portions of the right middle lobe and right lower lobe are seen. There is a small area of right apical pleural fluid versus pleural thickening. No pneumothorax is identified. Postoperative changes are seen within the lower cervical and upper thoracic spine. The visualized skeletal structures are unremarkable. IMPRESSION: 1. Stable right upper lobe airspace disease and adjacent area of cavitation. 2. Small area of right apical pleural fluid versus pleural thickening. 3. Low lung volumes with mild, stable elevation of the right hemidiaphragm and diffusely prominent interstitial lung markings. Electronically Signed   By: [name] Dials M.D.   On: 04/20/2024 00:55   DG Chest Port 1 View Result Date: 04/19/2024 CLINICAL DATA:  [id number] SOB (shortness of breath) [id number] EXAM: PORTABLE CHEST 1 VIEW COMPARISON:  Chest x-ray 03/25/2024, CT angio chest 03/25/2024 FINDINGS: The heart and mediastinal contours are unchanged. Coarsened interstitial markings with improved aeration of the right apex but worsened airspace opacity of the inferior  portion of the right upper lobe and superior portion of the left upper lobe. Central lucency within the right upper lobe suggestive of known cavitation. Question small loculated right apical pleural effusion. No pneumothorax. No acute osseous abnormality. IMPRESSION: 1. Improved aeration of the right apex but worsened airspace opacity of the inferior portion of the right upper lobe and superior portion of the left upper lobe. Central lucency within the right upper lobe suggestive of known cavitation. Question small loculated right  apical pleural effusion. Concern for multifocal pneumonia with known underlying mass. Consider pulmonary consultation for consideration of further imaging such as PET CT. 2. Underlying interstitial lung disease. Electronically Signed   By: Morgane  Naveau M.D.   On: 04/19/2024 00:52   CT Angio Chest PE W and/or Wo Contrast Result Date: 03/25/2024 CLINICAL DATA:  Pulmonary embolism (PE) suspected, high prob RUL consolidation on xr, hx lung cancer. * Tracking Code: BO * EXAM: CT ANGIOGRAPHY CHEST WITH CONTRAST TECHNIQUE: Multidetector CT imaging of the chest was performed using the standard protocol during bolus administration of intravenous contrast. Multiplanar CT image reconstructions and MIPs were obtained to evaluate the vascular anatomy. RADIATION DOSE REDUCTION: This exam was performed according to the departmental dose-optimization program which includes automated exposure control, adjustment of the mA and/or kV according to patient size and/or use of iterative reconstruction technique. CONTRAST:  75mL OMNIPAQUE  IOHEXOL  350 MG/ML SOLN COMPARISON:  CT scan chest from 10/11/2023. FINDINGS: Cardiovascular: No embolism to the proximal subsegmental pulmonary artery level. Normal cardiac size. no pericardial effusion. No aortic aneurysm. Mediastinum/Nodes: Visualized thyroid  gland appears grossly unremarkable. There is infiltrating soft tissue predominantly centered around the right central hilum. Which has increased since the prior study. There is interval decrease in the previously seen precarinal and right paratracheal lymph nodes. Subcarinal lymph node also appears decreased in size since the prior study. No axillary or left hilar lymphadenopathy by size criteria. The esophagus is nondistended precluding optimal assessment. There is mild circumferential thickening of the lower thoracic esophagus, which is most likely seen in the settings of chronic gastroesophageal reflux disease versus esophagitis.  Lungs/Pleura: Redemonstration of complete cut off of the right upper lobe bronchus secondary to and ill-defined infiltrating mass, present since the prior study. There is severe narrowing of the segmental pulmonary artery branch in the mass (series 4, image 57). There are heterogeneous opacities and several cavitary lesions in the right upper lobe, which are new since the prior study. Redemonstration of moderate to severe peripheral/subpleural reticulations with honeycombing and patchy areas of bronchiectasis, with lower lobe predominance on the right and upper lobe predominance on the left, concerning for interstitial lung disease. There is new heterogeneous opacity in the superior segment of right lower lobe (series 6, image 63), and posterior segment of right upper lobe (series 6, image 68), which are nonspecific and may represent focal pneumonia however, attention on follow-up examination is recommended to exclude neoplastic process. Rest of the lungs are otherwise clear. No mass, consolidation, pleural effusion or pneumothorax. No suspicious lung nodule. Upper Abdomen: There is a small sliding hiatal hernia. Visualized upper abdominal viscera within normal limits. Musculoskeletal: The visualized soft tissues of the chest wall are grossly unremarkable. No suspicious osseous lesions. There are mild multilevel degenerative changes in the visualized spine. Posterior spinal fixation of T1 and T2 noted with transpedicular screws and rods. There is mild anterior wedging deformity of T9 and T11 vertebrae, similar to the prior study. IMPRESSION: 1. No embolism to the proximal subsegmental pulmonary artery level.  2. Redemonstration of complete cut off of the right upper lobe bronchus secondary to an ill-defined infiltrating mass, present since the prior study. There is interval increase in the infiltrating soft tissue in the right central hilum. 3. There is interval decrease in the size of previously seen mediastinal  lymph nodes. 4. There are new heterogeneous opacities in the right upper lobe, which may represent postoperative pneumonia. There are several new cavitary lesions in the right upper lobe, which may represent post infective pneumatoceles. 5. Redemonstration of findings suggestive of interstitial lung disease, as described above. 6. Multiple other nonacute observations, as described above. Electronically Signed   By: Ree Molt M.D.   On: 03/25/2024 16:33   DG Chest Port 1 View Result Date: 03/25/2024 CLINICAL DATA:  Shortness of breath EXAM: PORTABLE CHEST 1 VIEW COMPARISON:  X-ray 01/31/2024 and older.  CT scan 10/11/2023. FINDINGS: Normal cardiopericardial silhouette. No pneumothorax or effusion. Underlying diffuse interstitial changes are again seen but there is some increasing areas of opacity. This includes a more confluent larger area of opacity in the right upper lobe as well as some patchy areas in the left mid lower lung. Acute infiltrates possible. Degenerative changes of the spine. Fixation hardware along the lower cervical spine. IMPRESSION: Increasing areas of parenchymal opacity, most confluent in the right upper lobe but also patchy areas in left mid lower lung. Please correlate clinical presentation. Short follow-up versus additional CT workup as clinically appropriate. Electronically Signed   By: Ranell Bring M.D.   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 This is a difficult situation. Postobstructive pneumonia will not improve until lung cancer is adequately treated. However with ongoing infection, he is not a candidate for aggressive chemotherapy treatments.  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  His previous [place] pulmonologist did not feel he is a candidate for stenting.  He may benefit  from palliative radiation. Prognosis is very poor given his multiple comorbidities and above difficult situation.  hospice/comfort care is reasonable..  Palliative care service has been involved.  # Acute exacerbation of CHF.  Bilateral lower extremity pitting edema. On IV Lasix .  Monitor ins and outs.  # Medication noncompliance and history of AMA. Consult Child psychotherapist. [facility].   Thank you for allowing me to participate in the care of this patient.   Zelphia Cap, MD, PhD Hematology Oncology 05/13/2024

## 2024-05-13 NOTE — Evaluation (Signed)
 Occupational Therapy Evaluation Patient Details Name: Timothy Matthews MRN: [id number] DOB: 1945/12/31 Today's Date: 05/13/2024   History of Present Illness   Pt is a 78 y/o M admitted on 05/05/24 after presenting with c/o worsening dyspnea with cough & sputum production, as well as wheezing. 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 Pt underwent bronchoscopy & pathology results showed squamous cell carcinoma of the RUL. PMH: emphysema, cocaine abuse, GERD, Hep C, HTN, prostate CA, lung CA, PTSD, CVA     Clinical Impressions Chart reviewed to date, pt greeted in chair, agreeable to OT evaluation with encouragement. Pt appears irritable throughout, reports he is MOD I-I in Adl/IADL, but will need to confirm this information. Pt presents with deficits in strength, endurance, activity tolerance, balance, affecting safe and optimal ADL completion. STS completed with CGA, amb with SPC approx 6' with MIN A. MAX A required for LB dressing, SET UP for eating lunch. Pt spo2 >90% on 3 L throughout but obviously increased work of breath with limited mobility. Pt will benefit from acute OT to address functional deficits/facilitate optimal ADL/functional mobility performance. Pt is left in chair, all needs met. OT will follow.      If plan is discharge home, recommend the following:   A little help with bathing/dressing/bathroom;A little help with walking and/or transfers;Direct supervision/assist for medications management     Functional Status Assessment   Patient has had a recent decline in their functional status and demonstrates the ability to make significant improvements in function in a reasonable and predictable amount of time.     Equipment Recommendations   Other (comment) (defer to next venue of care)     Recommendations for Other Services         Precautions/Restrictions   Precautions Precautions: Fall Recall  of Precautions/Restrictions: Impaired Restrictions Weight Bearing Restrictions Per Provider Order: No     Mobility Bed Mobility               General bed mobility comments: NT in recliner pre/post session    Transfers Overall transfer level: Needs assistance Equipment used: None Transfers: Sit to/from Stand Sit to Stand: Contact guard assist                  Balance Overall balance assessment: Needs assistance Sitting-balance support: Feet supported Sitting balance-Leahy Scale: Good     Standing balance support: Single extremity supported Standing balance-Leahy Scale: Fair                             ADL either performed or assessed with clinical judgement   ADL Overall ADL's : Needs assistance/impaired Eating/Feeding: Set up;Sitting                   Lower Body Dressing: Maximal assistance;Sit to/from stand;Sitting/lateral leans   Toilet Transfer: Minimal assistance;Ambulation;Cueing for sequencing;Cueing for safety Toilet Transfer Details (indicate cue type and reason): SPC         Functional mobility during ADLs: Minimal assistance (SPC approx 6' in room)       Vision Patient Visual Report: No change from baseline       Perception         Praxis         Pertinent Vitals/Pain Pain Assessment Pain Assessment: No/denies pain     Extremity/Trunk Assessment Upper Extremity Assessment Upper Extremity Assessment: Generalized weakness   Lower Extremity Assessment  Lower Extremity Assessment: Generalized weakness       Communication Communication Communication: Impaired Factors Affecting Communication: Reduced clarity of speech   Cognition Arousal: Alert Behavior During Therapy: WFL for tasks assessed/performed (irritable) Cognition: Cognition impaired     Awareness: Intellectual awareness impaired, Online awareness impaired   Attention impairment (select first level of impairment): Sustained  attention Executive functioning impairment (select all impairments): Reasoning, Problem solving                   Following commands: Intact       Cueing  General Comments   Cueing Techniques: Verbal cues  pt on 3L via [place] througout, spo2 >90% throughout   Exercises Other Exercises Other Exercises: edu re: role of OT, role of rehab, discharge recommendations, home safety, falls prevention   Shoulder Instructions      Home Living Family/patient expects to be discharged to:: Private residence Living Arrangements: Alone Available Help at Discharge: Family;Available PRN/intermittently Type of Home: Apartment Home Access: Stairs to enter Entrance Stairs-Number of Steps: 2 flights Entrance Stairs-Rails: Right Home Layout: One level     Bathroom Shower/Tub: Chief Strategy Officer: Standard     Home Equipment: Cane - single point          Prior Functioning/Environment Prior Level of Function :  (Pt does not provide PLOF information)             Mobility Comments: pt report he amb with SPC ADLs Comments: pt reports he is MOD I-I with ADL/IADL; will need to confirm    OT Problem List:     OT Treatment/Interventions:        OT Goals(Current goals can be found in the care plan section)   Acute Rehab OT Goals Patient Stated Goal: get stronger OT Goal Formulation: With patient Time For Goal Achievement: 05/27/24 Potential to Achieve Goals: Good ADL Goals Pt Will Perform Grooming: with modified independence;sitting;standing Pt Will Perform Lower Body Dressing: with modified independence;sit to/from stand;sitting/lateral leans Pt Will Transfer to Toilet: with modified independence;ambulating Pt Will Perform Toileting - Clothing Manipulation and hygiene: with modified independence;sitting/lateral leans;sit to/from stand   OT Frequency:  Min 2X/week    Co-evaluation              AM-PAC OT 6 Clicks Daily Activity     Outcome Measure  Help from another person eating meals?: None Help from another person taking care of personal grooming?: None Help from another person toileting, which includes using toliet, bedpan, or urinal?: A Little Help from another person bathing (including washing, rinsing, drying)?: A Little Help from another person to put on and taking off regular upper body clothing?: None Help from another person to put on and taking off regular lower body clothing?: A Lot 6 Click Score: 20   End of Session Equipment Utilized During Treatment: Other (comment);Oxygen [facility]) Nurse Communication: Mobility status  Activity Tolerance: Patient tolerated treatment well Patient left: in chair;with call bell/phone within reach;with chair alarm set  OT Visit Diagnosis: Other abnormalities of gait and mobility (R26.89);Muscle weakness (generalized) (M62.81)                Time: 8697-8685 OT Time Calculation (min): 12 min Charges:  OT General Charges $OT Visit: 1 Visit OT Evaluation $OT Eval Moderate Complexity: 1 Mod  Therisa Sheffield, OTD OTR/L  05/13/24, 3:44 PM

## 2024-05-13 NOTE — Discharge Instructions (Signed)
 Rent/Utility/Housing  Agency Name: [facility] Agency Address: [address] [place], [place] [address] Phone: [phone] Email: [email or web address] Website: [email or web address] Service(s) Offered: Housing services, self-sufficiency, congregate meal program, weatherization program, Field seismologist program, emergency food assistance,  housing counseling, home ownership program, wheels -towork program.  Agency Name: [facility] Address: [address] [place], [place] [address] Phone: [phone] (8a-4p) [phone] (8p- 10p) Email: [email or web address] Website: [email or web address] Service(s) Offered: A program for homeless and/or needy men that includes one-on-one counseling, life skills training and job rehabilitation.  Agency Name: [facility] Address: [address] [place], [place] [address] Phone: [phone] Website: www.[email or web address].org Service(s) Offered: Assistance to needy in emergency with utility bills, heating fuel, and prescriptions. Shelter for homeless 7pm-7am. February 05, 2017 15  Agency Name: [facility] [place] [facility] Address: [address], [place], [place] [address] Phone: [phone] Contact Person: Cathleen Corti Email: [email or web address] Website: [email or web address].[email or web address] Service(s) Offered: Helps individuals with developmental disabilities move from housing that is more restrictive to homes where they  can achieve greater independence and have more  opportunities.  Agency Name: [facility] Address: [address], [place], [place] [address] Phone: [phone] Email: [email or web address] Website: www.[email or web address].org Service(s) Offered: Provides affordable housing for low-income families, elderly, and disabled individuals. Offer a wide range of  programs and services, from financial planning to  afterschool and summer programs.  Agency Name: [facility] Address: [address] [place], [place] [address] Phone: [phone] Service(s) Offered: Child support services; child welfare services; food stamps; Medicaid; work first family assistance; and aid with fuel,  rent, food and medicine.  Agency Name: [facility] [place] [employer] Address: [facility] [address], [place], [place]  [address] Phone: [phone] Website: www.familyabuseservices.org Service(s) Offered: 24 hour Crisis Line: [phone]; 24 hour Emergency Shelter; Transitional Housing; Support Groups; Scientist, physiological; [employer]; Hispanic Outreach: [phone];  Visitation Center: [phone].  Agency Name: [facility] [place]. Address: [address] [place], [place] [address] Phone: [phone] Service(s) Offered: CAP Services; Home and [employer]; Individual or Group Supports; Respite Care Non-Institutional Nursing;  Residential Supports; Respite Care and Personal Care Services; Transportation; Family and Friends Night; Recreational Activities; Three Nutritious Meals/Snacks; Consultation with Registered Dietician; Twenty-four hour Registered Nurse Access; Daily and [employer]; [employer]aves; [place] for the [employer] (During Summer Months) Bingo Night (Every  Wednesday Night); Special Populations Dance Night  (Every Tuesday Night); Professional Hair Care Services.  Agency Name: [facility] Address: [address], [place], [place] [address] Phone: [phone] Contact Person: Jabier Mutton Website: [email or web address].Physicist, medical) Offered: Residential treatment facility for women; food and  clothing, educational & employment development and  transportation to work; Counsellor of financial skills;  parenting and family reunification; emotional and spiritual  support;  transitional housing for program graduates.  Agency Name: [facility] Address: [address] [place], [place] [address] Phone: [phone] Email: [email or web address] Website: TaskTown.es Service(s) Offered: Public housing units for elderly, disabled, and low income people; housing choice vouchers for income eligible  applicants; shelter plus care vouchers; and Psychologist, clinical.  Agency Name: [facility] [employer] Address: [address] [place], [place] [address] Phone: [phone] Email: [email or web address] Website: www.habitatalamance.org Service(s) Offered: Build houses for families in need of decent housing. Each adult in the family must invest 200 hours of labor on  someone else's house, work with volunteers to build their own house, attend classes  on budgeting, home maintenance, yard care, and attend homeowner association meetings.  Agency Name: [facility] Address: [address], [place], [place] [address] Phone: [phone] Website: www.rsli.org Service(s) Offered: Intermediate care facilities for intellectually delayed, Supervised Living in group homes for adults with developmental disabilities, Supervised Living for people who have dual diagnoses (MRMI), Independent Living, Supported Living, respite and a variety of CAP services, pre-vocational services, day supports, and [employer].  Agency Name: [place] [employer] Phone: [phone] Website: www.[place]CForeclosurePrevention.gov Service(s) Offered: Zero-interest, deferred loans to homeowners struggling to pay their mortgage. Call for more information.

## 2024-05-13 NOTE — Evaluation (Signed)
 Physical Therapy Evaluation Patient Details Name: Timothy Matthews MRN: [id number] DOB: 1946-05-01 Today's Date: 05/13/2024  History of Present Illness  Pt is a 78 y/o M admitted on 05/05/24 after presenting with c/o worsening dyspnea with cough & sputum production, as well as wheezing. 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 Pt underwent bronchoscopy & pathology results showed squamous cell carcinoma of the RUL. PMH: emphysema, cocaine abuse, GERD, Hep C, HTN, prostate CA, lung CA, PTSD, CVA  Clinical Impression  Pt seen for PT evaluation with pt received in bed. Pt with minimal engagement, reports he lives in an apartment by himself but notes the ceiling got wet or fell in & reports he cannot go back there right now - case manager made aware. Pt reluctantly agreeable to getting OOB despite ongoing education/encouragement re: benefits of OOB mobility. Pt is able to complete bed mobility with supervision, transfer to recliner without AD with CGA. Pt would benefit from ongoing PT treatment to progress endurance, gait & stair negotiation as pt has 2 flights of stairs to access his 2nd floor apartment where he lives alone.        If plan is discharge home, recommend the following: A little help with walking and/or transfers;A little help with bathing/dressing/bathroom;Assistance with cooking/housework;Help with stairs or ramp for entrance;Assist for transportation   Can travel by private vehicle   Yes    Equipment Recommendations Rolling walker (2 wheels);BSC/3in1  Recommendations for Other Services       Functional Status Assessment Patient has had a recent decline in their functional status and demonstrates the ability to make significant improvements in function in a reasonable and predictable amount of time.     Precautions / Restrictions Precautions Precautions: Fall Restrictions Weight Bearing Restrictions Per Provider  Order: No      Mobility  Bed Mobility Overal bed mobility: Needs Assistance Bed Mobility: Supine to Sit     Supine to sit: Supervision, HOB elevated, Used rails          Transfers Overall transfer level: Needs assistance Equipment used: None Transfers: Sit to/from Stand, Bed to chair/wheelchair/BSC Sit to Stand: Supervision   Step pivot transfers: Contact guard assist (bed>recliner on R without AD, reaches for chair armrests)            Ambulation/Gait Ambulation/Gait assistance:  (pt declined)                Stairs            Wheelchair Mobility     Tilt Bed    Modified Rankin (Stroke Patients Only)       Balance Overall balance assessment: Needs assistance Sitting-balance support: Feet supported Sitting balance-Leahy Scale: Good     Standing balance support: Single extremity supported Standing balance-Leahy Scale: Fair                               Pertinent Vitals/Pain Pain Assessment Pain Assessment: Faces Faces Pain Scale: No hurt    Home Living Family/patient expects to be discharged to:: Private residence Living Arrangements: Alone   Type of Home: Apartment (2nd floor apartment) Home Access: Stairs to enter Entrance Stairs-Rails: Right (per chart) Entrance Stairs-Number of Steps: 2 flights     Home Equipment: Cane - single point      Prior Function Prior Level of Function :  (Pt does not provide PLOF information)  Extremity/Trunk Assessment   Upper Extremity Assessment Upper Extremity Assessment: Overall WFL for tasks assessed    Lower Extremity Assessment Lower Extremity Assessment: Generalized weakness       Communication   Communication Factors Affecting Communication: Reduced clarity of speech    Cognition Arousal: Alert Behavior During Therapy:  (irritable)   PT - Cognitive impairments: Difficult to assess                       PT - Cognition  Comments: Irritable, perseverative on being cold despite PT providing him with warm blankets & turning temperature up in room. Not open to questions re: PLOF/home set up, does not provide much information. Following commands: Intact       Cueing Cueing Techniques: Verbal cues     General Comments General comments (skin integrity, edema, etc.): Pt on 3L/min via nasal cannula, SPO2 90% or greater    Exercises     Assessment/Plan    PT Assessment Patient needs continued PT services  PT Problem List Decreased strength;Cardiopulmonary status limiting activity;Decreased activity tolerance;Decreased knowledge of use of DME;Decreased balance;Decreased mobility;Decreased safety awareness       PT Treatment Interventions DME instruction;Balance training;Gait training;Neuromuscular re-education;Stair training;Functional mobility training;Therapeutic activities;Therapeutic exercise;Patient/family education    PT Goals (Current goals can be found in the Care Plan section)  Acute Rehab PT Goals Patient Stated Goal: get warm PT Goal Formulation: With patient Time For Goal Achievement: 05/27/24 Potential to Achieve Goals: Good    Frequency Min 2X/week     Co-evaluation               AM-PAC PT 6 Clicks Mobility  Outcome Measure Help needed turning from your back to your side while in a flat bed without using bedrails?: None Help needed moving from lying on your back to sitting on the side of a flat bed without using bedrails?: A Little Help needed moving to and from a bed to a chair (including a wheelchair)?: A Little Help needed standing up from a chair using your arms (e.g., wheelchair or bedside chair)?: A Little Help needed to walk in hospital room?: A Little Help needed climbing 3-5 steps with a railing? : A Lot 6 Click Score: 18    End of Session Equipment Utilized During Treatment: Oxygen Activity Tolerance:  (pt self limiting) Patient left: in chair;with call bell/phone  within reach;with chair alarm set;with nursing/sitter in room Nurse Communication: Mobility status PT Visit Diagnosis: Muscle weakness (generalized) (M62.81);Other abnormalities of gait and mobility (R26.89)    Time: 1203-1216 PT Time Calculation (min) (ACUTE ONLY): 13 min   Charges:   PT Evaluation $PT Eval Moderate Complexity: 1 Mod   PT General Charges $$ ACUTE PT VISIT: 1 Visit         Richerd Pinal, PT, DPT 05/13/24, 12:33 PM   Richerd CHRISTELLA Pinal 05/13/2024, 12:30 PM

## 2024-05-13 NOTE — Progress Notes (Signed)
 Progress Note   Patient: Timothy Matthews FMW:[id number] DOB: [birth year] DOA: 05/05/2024     8 DOS: the patient was seen and examined on 05/13/2024      Brief hospital course:   78 y.o. male with medical history significant for emphysema, cocaine abuse, GERD, hepatitis C, hypertension, prostate cancer, lung cancer,    PTSD and CVA, who presented to the emergency room with acute onset of worsening dyspnea with associated cough productive of thick whitish sputum as well as wheezing over the last week with no fever or chills.  Found to have exacerbation of his postobstructive pneumonia right upper lobe.   Assessment and Plan:   Acute on chronic postobstructive cavitary pneumonia in the setting of squamous cell carcinoma of the lungs Acute hypoxic respiratory failure secondary to above - Known right upper lobe mass, multiple hospitalizations for pneumonia.  Complaining of worsening cough productive sputum on presentation.  Likely postobstructive secondary to mass with developing cavitary pneumonia.  Was supposed to receive bronch 6/16 however was canceled due to ongoing cocaine use.  Continue Unasyn  plus azithromycin .  MRSA swab negative so discontinued vancomycin .  Repeat CT imaging 7/27 noting progression and interval enlargement of cavitation.  Pulmonology consulted and underwent bronchoscopy with EBUS on 05/10/2024.   Pathology results came back showing squamous cell carcinoma of the right upper lobe that is a potential explanation of the cavitary lesion Continue to monitor closely   Acute COPD exacerbation - Exacerbated by above.  Wheezing and shortness of breath on exertion.  Continue orticosteroids, nebulizers, mucolytic's, antibiotics as above.   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  Likely exacerbated by IV fluids and antibiotic use.  No worsening shortness of breath.  Will continue scheduled IV Lasix .   Monitor urine output.  Monitor renal function closely   RUL lung adenocarcinoma - Reportedly is followed closely at the [facility]. status post chemoradiation. Bronchoscopy done was done on 05/10/2024 showed findings of squamous cell carcinoma of the right upper lobe Pulmonologist following Patient will need follow-up with oncology   History of CVA - Aspirin  on board   Peripheral neuropathy - Neurontin  on board.   BPH - Continue Flomax .   Noncompliance with medical regimen - History of leaving AMA.  [facility].  States he completed chemo/radiation.       Subjective:  Patient seen and examined at bedside We discussed patient's diagnosis with him Palliative care consulted as well as oncologist Patient has been refusing his medication I have encouraged him to completed a course of antibiotic therapy   Physical Exam:   GENERAL: Alert and awake on intranasal oxygen HEENT:  EOMI, nasal cannula CARDIOVASCULAR:  RRR, no murmurs appreciated RESPIRATORY: Dry cough, poor air movement GASTROINTESTINAL:  Soft, nontender, nondistended EXTREMITIES: BL LE pitting edema NEURO:  No new focal deficits appreciated SKIN:  No rashes noted PSYCH:  Appropriate mood and affect        Data Reviewed:  Vitals:   05/12/24 2059 05/13/24 0509 05/13/24 0901 05/13/24 1559  BP: (!) 143/71 130/72 128/79 119/66  Pulse: 91 78 87 87  Resp: (!) 22 18 18 18   Temp: 97.9 F (36.6 C) 98.4 F (36.9 C) 98.3 F (36.8 C) 98.4 F (36.9 C)  TempSrc:    Oral  SpO2: 94% 96% 95% 99%  Weight:      Height:  Latest Ref Rng & Units 05/13/2024    2:49 AM 05/12/2024    4:59 AM 05/11/2024    4:43 AM  CBC  WBC 4.0 - 10.5 K/uL 12.0  10.5  12.1   Hemoglobin 13.0 - 17.0 g/dL 89.8  89.4  9.9   Hematocrit 39.0 - 52.0 % 32.9  34.0  31.5   Platelets 150 - 400 K/uL 224  224  210        Latest Ref Rng & Units 05/13/2024    2:49 AM 05/12/2024    4:59 AM 05/11/2024    4:43 AM   BMP  Glucose 70 - 99 mg/dL 82  898  847   BUN 8 - 23 mg/dL 27  28  31    Creatinine 0.61 - 1.24 mg/dL 9.15  9.11  9.00   Sodium 135 - 145 mmol/L 135  138  135   Potassium 3.5 - 5.1 mmol/L 4.3  4.1  4.1   Chloride 98 - 111 mmol/L 98  100  96   CO2 22 - 32 mmol/L 28  29  28    Calcium  8.9 - 10.3 mg/dL 8.2  8.4  8.4      Author: Drue ONEIDA Potter, MD 05/13/2024 4:28 PM  For on call review [email or web address].

## 2024-05-14 DIAGNOSIS — J189 Pneumonia, unspecified organism: Secondary | ICD-10-CM | POA: Diagnosis not present

## 2024-05-14 DIAGNOSIS — C3491 Malignant neoplasm of unspecified part of right bronchus or lung: Secondary | ICD-10-CM | POA: Diagnosis not present

## 2024-05-14 DIAGNOSIS — J984 Other disorders of lung: Secondary | ICD-10-CM | POA: Diagnosis not present

## 2024-05-14 DIAGNOSIS — J441 Chronic obstructive pulmonary disease with (acute) exacerbation: Secondary | ICD-10-CM | POA: Diagnosis not present

## 2024-05-14 DIAGNOSIS — Z515 Encounter for palliative care: Secondary | ICD-10-CM | POA: Diagnosis not present

## 2024-05-14 LAB — CBC WITH DIFFERENTIAL/PLATELET
Abs Granulocyte: 9.6 K/uL — ABNORMAL HIGH (ref 1.5–6.5)
Abs Immature Granulocytes: 0.19 K/uL — ABNORMAL HIGH (ref 0.00–0.07)
Basophils Absolute: 0 K/uL (ref 0.0–0.1)
Basophils Relative: 0 %
Eosinophils Absolute: 0.1 K/uL (ref 0.0–0.5)
Eosinophils Relative: 0 %
HCT: 33.6 % — ABNORMAL LOW (ref 39.0–52.0)
Hemoglobin: 10.3 g/dL — ABNORMAL LOW (ref 13.0–17.0)
Immature Granulocytes: 2 %
Lymphocytes Relative: 7 %
Lymphs Abs: 0.8 K/uL (ref 0.7–4.0)
MCH: 25.2 pg — ABNORMAL LOW (ref 26.0–34.0)
MCHC: 30.7 g/dL (ref 30.0–36.0)
MCV: 82.4 fL (ref 80.0–100.0)
Monocytes Absolute: 0.9 K/uL (ref 0.1–1.0)
Monocytes Relative: 8 %
Neutro Abs: 9.6 K/uL — ABNORMAL HIGH (ref 1.7–7.7)
Neutrophils Relative %: 83 %
Platelets: 212 K/uL (ref 150–400)
RBC: 4.08 MIL/uL — ABNORMAL LOW (ref 4.22–5.81)
RDW: 20.3 % — ABNORMAL HIGH (ref 11.5–15.5)
Smear Review: NORMAL
WBC: 11.5 K/uL — ABNORMAL HIGH (ref 4.0–10.5)
nRBC: 0 % (ref 0.0–0.2)

## 2024-05-14 LAB — BASIC METABOLIC PANEL WITH GFR
Anion gap: 12 (ref 5–15)
BUN: 29 mg/dL — ABNORMAL HIGH (ref 8–23)
CO2: 26 mmol/L (ref 22–32)
Calcium: 8.4 mg/dL — ABNORMAL LOW (ref 8.9–10.3)
Chloride: 95 mmol/L — ABNORMAL LOW (ref 98–111)
Creatinine, Ser: 1 mg/dL (ref 0.61–1.24)
GFR, Estimated: >60 mL/min (ref >60)
Glucose, Bld: 112 mg/dL — ABNORMAL HIGH (ref 70–99)
Potassium: 4 mmol/L (ref 3.5–5.1)
Sodium: 133 mmol/L — ABNORMAL LOW (ref 135–145)

## 2024-05-14 NOTE — Progress Notes (Signed)
 Progress Note   Patient: Timothy Matthews FMW:[id number] DOB: [birth year] DOA: 05/05/2024     9 DOS: the patient was seen and examined on 05/14/2024   Brief hospital course:   78 y.o. male with medical history significant for emphysema, cocaine abuse, GERD, hepatitis C, hypertension, prostate cancer, lung cancer,    PTSD and CVA, who presented to the emergency room with acute onset of worsening dyspnea with associated cough productive of thick whitish sputum as well as wheezing over the last week with no fever or chills.  Found to have exacerbation of his postobstructive pneumonia right upper lobe.   Assessment and Plan:   Acute on chronic postobstructive cavitary pneumonia in the setting of squamous cell carcinoma of the lungs Acute hypoxic respiratory failure secondary to above - Known right upper lobe mass, multiple hospitalizations for pneumonia.  Complaining of worsening cough productive sputum on presentation.  Likely postobstructive secondary to mass with developing cavitary pneumonia.  Was supposed to receive bronch 6/16 however was canceled due to ongoing cocaine use.  Continue Unasyn  plus azithromycin .  MRSA swab negative so discontinued vancomycin .  Repeat CT imaging 7/27 noting progression and interval enlargement of cavitation.  Pulmonology consulted and underwent bronchoscopy with EBUS on 05/10/2024.   Pathology results came back showing squamous cell carcinoma of the right upper lobe that is a potential explanation of the cavitary lesion Continue to monitor closely   Acute COPD exacerbation - Exacerbated by above.  Wheezing and shortness of breath on exertion.  Continue orticosteroids, nebulizers, mucolytic's, antibiotics as above.   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  Likely exacerbated by IV fluids and antibiotic use.  No worsening shortness of breath.  Will continue scheduled IV Lasix .  Monitor  urine output.  Monitor renal function closely   RUL lung adenocarcinoma - Reportedly is followed closely at the [facility]. status post chemoradiation. Bronchoscopy done was done on 05/10/2024 showed findings of squamous cell carcinoma of the right upper lobe Pulmonologist following Patient will need follow-up with oncology   History of CVA - Aspirin  on board   Peripheral neuropathy - Neurontin  on board.   BPH - Continue Flomax .   Noncompliance with medical regimen - History of leaving AMA.  [facility]  States he completed chemo/radiation.       Subjective:  Patient seen and examined at bedside Has been seen by oncologist Admits to improvement in respiratory function Denies chest pain   Physical Exam:   GENERAL: Alert and awake on intranasal oxygen HEENT:  EOMI, nasal cannula CARDIOVASCULAR:  RRR, no murmurs appreciated RESPIRATORY: Dry cough, poor air movement GASTROINTESTINAL:  Soft, nontender, nondistended EXTREMITIES: BL LE pitting edema NEURO:  No new focal deficits appreciated SKIN:  No rashes noted PSYCH:  Appropriate mood and affect        Data Reviewed:      Latest Ref Rng & Units 05/14/2024    5:24 AM 05/13/2024    2:49 AM 05/12/2024    4:59 AM  CBC  WBC 4.0 - 10.5 K/uL 11.5  12.0  10.5   Hemoglobin 13.0 - 17.0 g/dL 89.6  89.8  89.4   Hematocrit 39.0 - 52.0 % 33.6  32.9  34.0   Platelets 150 - 400 K/uL 212  224  224     Vitals:   05/13/24 1559 05/13/24 2005 05/14/24 0438 05/14/24 0828  BP: 119/66 123/85 113/73  105/66  Pulse: 87 85 91 89  Resp: 18  18 16   Temp: 98.4 F (36.9 C) 98.6 F (37 C) 99.2 F (37.3 C) 98.1 F (36.7 C)  TempSrc: Oral     SpO2: 99% 97% 96% 94%  Weight:      Height:          Latest Ref Rng & Units 05/14/2024    5:24 AM 05/13/2024    2:49 AM 05/12/2024    4:59 AM  BMP  Glucose 70 - 99 mg/dL 887  82  898   BUN 8 - 23 mg/dL 29  27  28    Creatinine 0.61 - 1.24 mg/dL 8.99  9.15  9.11    Sodium 135 - 145 mmol/L 133  135  138   Potassium 3.5 - 5.1 mmol/L 4.0  4.3  4.1   Chloride 98 - 111 mmol/L 95  98  100   CO2 22 - 32 mmol/L 26  28  29    Calcium  8.9 - 10.3 mg/dL 8.4  8.2  8.4       Author: Drue ONEIDA Potter, MD 05/14/2024 12:37 PM  For on call review [email or web address].

## 2024-05-14 NOTE — Progress Notes (Signed)
 Daily Progress Note   Patient Name: Timothy Matthews       Date: 05/14/2024 DOB: 03-04-1946  Age: 78 y.o. MRN#: [id number] Attending Physician: Dorinda Drue DASEN, MD Primary Care Physician: Dorise Sonny Bradley, MD Admit Date: 05/05/2024  Reason for Consultation/Follow-up: Establishing goals of care  HPI/Brief Hospital Review:  78 y.o. male  with past medical history of emphysema, polysubstance abuse, hepatitis C, hypertension, history of prostate cancer, CVA, PTSD and recent diagnosis of right [facility] and has [facility] and chemotherapy admitted on 05/05/2024 with worsening shortness of breath.   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  Subjective: Extensive chart review has been completed prior to meeting patient including labs, vital signs, imaging, progress notes, orders, and available advanced directive documents from current and previous encounters.    Visited with Timothy Matthews at his bedside.  He is sleeping in recliner but awakens to calling of his name.  He is groggy and unable to participate in conversations and easily drifts back off to sleep.  No family or visitors at bedside during time of visit.  Spoke with nursing staff, nursing reports Timothy Matthews agreed to his medication this morning except gabapentin .  According to nursing staff he was complaining of a significant productive cough and was given a dose of Tussionex likely contributing to his drowsiness.  Spoke with [name], she has had contact with Timothy Matthews  Matthews-hopeful Timothy Matthews will be able to return home from rehab in a few days. Per [name], Timothy Matthews children do not feel it would be in her best interest to engage in conversations or caring for Timothy Matthews at this time. Discussed with [name] the role of Advanced Directives, conversations would need to be had with Timothy Matthews when he is more awake and alert in his wishes in appointment HCPOA. [name] is waiting until Monday to have conversations regarding Timothy Matthews's care with his providers within the [facility] system.  Answered and addressed all questions and concerns.  PMT will continue to follow for ongoing needs and support.  Objective:  Physical Exam Constitutional:      Comments: Drowsy  Pulmonary:     Effort: Pulmonary effort is normal. No respiratory distress.     Comments: Congested and productive cough Skin:    General: Skin is warm and dry.  Neurological:     Mental Status: He is alert, oriented to person, place, and time and easily aroused.     Motor: Weakness present.  Psychiatric:        Mood and Affect: Mood normal.        Thought Content: Thought content normal.             Vital Signs: BP 105/66 (BP Location: Right Arm)   Pulse 89   Temp 98.1 F (36.7 C)   Resp 16   Ht 5' 10 (1.778 m)   Wt 72.3 kg   SpO2 94%   BMI 22.87 kg/m  SpO2: SpO2: 94 % O2 Device: O2 Device: Nasal Cannula O2 Flow Rate: O2 Flow Rate (L/min): 3 L/min   Palliative Care Assessment & Plan   Assessment/Recommendation/Plan  PMT to continue to follow for ongoing needs and support  Thank you for allowing the Palliative Medicine Team to assist in the care of this patient.  Total time:  35 minutes  Time spent includes: Detailed review of medical records (labs, imaging, vital signs), medically appropriate exam (mental status, respiratory, cardiac, skin), discussed with treatment team, counseling and educating patient, family and staff, documenting clinical information, medication management and  coordination of care.  Waddell Lesches, DNP, AGNP-C Palliative Medicine   Please contact Palliative Medicine Team phone at [phone] for questions and concerns.

## 2024-05-15 DIAGNOSIS — C3491 Malignant neoplasm of unspecified part of right bronchus or lung: Secondary | ICD-10-CM | POA: Diagnosis not present

## 2024-05-15 DIAGNOSIS — Z515 Encounter for palliative care: Secondary | ICD-10-CM | POA: Diagnosis not present

## 2024-05-15 DIAGNOSIS — J441 Chronic obstructive pulmonary disease with (acute) exacerbation: Secondary | ICD-10-CM | POA: Diagnosis not present

## 2024-05-15 DIAGNOSIS — J984 Other disorders of lung: Secondary | ICD-10-CM | POA: Diagnosis not present

## 2024-05-15 DIAGNOSIS — J189 Pneumonia, unspecified organism: Secondary | ICD-10-CM | POA: Diagnosis not present

## 2024-05-15 MED ORDER — MORPHINE SULFATE (PF) 2 MG/ML IV SOLN
2.0000 mg | INTRAVENOUS | Status: DC | PRN
  Administered 2024-05-15 (×2): 2 mg via INTRAVENOUS
  Filled 2024-05-15 (×2): qty 1

## 2024-05-15 MED ORDER — FUROSEMIDE 10 MG/ML IJ SOLN
40.0000 mg | Freq: Two times a day (BID) | INTRAMUSCULAR | Status: DC
  Administered 2024-05-15 – 2024-05-20 (×10): 40 mg via INTRAVENOUS
  Filled 2024-05-15 (×11): qty 4

## 2024-05-15 NOTE — Progress Notes (Signed)
 Progress Note   Patient: Timothy Matthews FMW:[id number] DOB: 1945-12-10 DOA: 05/05/2024     10 DOS: the patient was seen and examined on 05/15/2024     Brief hospital course:   78 y.o. male with medical history significant for emphysema, cocaine abuse, GERD, hepatitis C, hypertension, prostate cancer, lung cancer,    PTSD and CVA, who presented to the emergency room with acute onset of worsening dyspnea with associated cough productive of thick whitish sputum as well as wheezing over the last week with no fever or chills.  Found to have exacerbation of his postobstructive pneumonia right upper lobe.   Assessment and Plan:   Acute on chronic postobstructive cavitary pneumonia in the setting of squamous cell carcinoma of the right lung Acute hypoxic respiratory failure secondary to above-present on admission - Known right upper lobe mass, multiple hospitalizations for pneumonia.  Complaining of worsening cough productive sputum on presentation.  Likely postobstructive secondary to mass with developing cavitary pneumonia.  Was supposed to receive bronch 6/16 however was canceled due to ongoing cocaine use.  Has completed 7 to 10 days course of Unasyn   MRSA swab negative so discontinued vancomycin .  Repeat CT imaging 7/27 noting progression and interval enlargement of cavitation.   Pulmonology consulted and underwent bronchoscopy with EBUS on 05/10/2024.   Pathology results came back showing squamous cell carcinoma of the right upper lobe that is a potential explanation of the cavitary lesion Continue to monitor closely   Acute COPD exacerbation - Exacerbated by above. Has completed corticosteroid therapy Continue, nebulizers, mucolytic's, antibiotics as above.   Continue supplemental oxygen as needed   Acute exacerbation of chronic HFpEF - Noted worsening BL LE edema.  Likely exacerbated by IV fluids and antibiotic use.  No worsening shortness of breath.  Echocardiogram showing findings of  grade 1 diastolic dysfunction with a EF of 60 to 65% Continue IV Lasix  to twice daily Continue to monitor input and output closely   RUL lung adenocarcinoma - Reportedly is followed closely at the [facility]. status post chemoradiation. Bronchoscopy done was done on 05/10/2024 showed findings of squamous cell carcinoma of the right upper lobe Pulmonologist following Oncology was consulted and case discussed   History of CVA - Aspirin  on board   Peripheral neuropathy - Neurontin  on board.   BPH Continue Flomax .   Noncompliance with medical regimen - History of leaving AMA.  [facility]  States he completed chemo/radiation.       Subjective:  Patient seen and examined at bedside Continues to be on 4 L of oxygen Patient and family entertaining possibility of transfer to the [facility] for further management as he follows up there end of his physicians at there.   Physical Exam:   GENERAL: Alert and awake on intranasal oxygen HEENT:  EOMI, nasal cannula CARDIOVASCULAR:  RRR, no murmurs appreciated RESPIRATORY: Dry cough, poor air movement GASTROINTESTINAL:  Soft, nontender, nondistended EXTREMITIES: BL LE pitting edema NEURO:  No new focal deficits appreciated SKIN:  No rashes noted PSYCH:  Appropriate mood and affect        Data Reviewed:   Vitals:   05/14/24 2139 05/14/24 2342 05/15/24 0429 05/15/24 0818  BP:   122/67 127/68  Pulse:   85 89  Resp:   18 17  Temp:   98.6 F (37 C) 98 F (36.7 C)  TempSrc:      SpO2: 95% 95% 94% 98%  Weight:  Height:          Latest Ref Rng & Units 05/14/2024    5:24 AM 05/13/2024    2:49 AM 05/12/2024    4:59 AM  CBC  WBC 4.0 - 10.5 K/uL 11.5  12.0  10.5   Hemoglobin 13.0 - 17.0 g/dL 89.6  89.8  89.4   Hematocrit 39.0 - 52.0 % 33.6  32.9  34.0   Platelets 150 - 400 K/uL 212  224  224        Latest Ref Rng & Units 05/14/2024    5:24 AM 05/13/2024    2:49 AM 05/12/2024    4:59 AM  BMP   Glucose 70 - 99 mg/dL 887  82  898   BUN 8 - 23 mg/dL 29  27  28    Creatinine 0.61 - 1.24 mg/dL 8.99  9.15  9.11   Sodium 135 - 145 mmol/L 133  135  138   Potassium 3.5 - 5.1 mmol/L 4.0  4.3  4.1   Chloride 98 - 111 mmol/L 95  98  100   CO2 22 - 32 mmol/L 26  28  29    Calcium  8.9 - 10.3 mg/dL 8.4  8.2  8.4      Author: Drue ONEIDA Potter, MD 05/15/2024 5:12 PM  For on call review [email or web address].

## 2024-05-15 NOTE — Progress Notes (Signed)
 Daily Progress Note   Patient Name: Timothy Matthews       Date: 05/15/2024 DOB: 1946-08-17  Age: 78 y.o. MRN#: [id number] Attending Physician: Dorinda Drue DASEN, MD Primary Care Physician: Dorise Sonny Bradley, MD Admit Date: 05/05/2024  Reason for Consultation/Follow-up: Establishing goals of care  HPI/Brief Hospital Review:  78 y.o. male  with past medical history of emphysema, polysubstance abuse, hepatitis C, hypertension, history of prostate cancer, CVA, PTSD and recent diagnosis of [facility] and has [facility] and chemotherapy admitted on 05/05/2024 with worsening shortness of breath.   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  Subjective: Extensive chart review has been completed prior to meeting patient including labs, vital signs, imaging, progress notes, orders, and available advanced directive documents from current and previous encounters.    Visited with Mr. Alsteen at his bedside. He is awake, alert and able to engage in conversation. He is sitting in chair, shares he is waiting for someone to wheel him outside for fresh air--mobility specialist. He reports feeling well today without acute complaints. Uneventful evening and able to enjoy his breakfast. No family or visitors at bedside during time of visit. Mr. Tomes shares he spoke with his wife [name] last night-she has been discharged home. He wishes for both [name] and [name] to be involved in his medical care.  Called and spoke with [name], she confirms  she and Mr. Ebling are still legally married but have been separated for some time. Shares they recently reconnected and over the last couple of months she has transported him to and from his medical appointments. [name] shares she was discharged home yesterday from rehab after suffering from a stroke and is dealing with residual effects. [name] shares she likely will no longer be able to support Mr. Liddicoat with transportation. [name] wishes to continue to be involved in Mr. Gillock's medical care.  Discussed with TOC possibility of exploring facility placement for Mr. Gorniak. Answered and addressed all questions and concerns. PMT to continue to follow for ongoing needs and support.  Objective:  Physical Exam Constitutional:  General: He is not in acute distress.    Appearance: He is ill-appearing.  Pulmonary:     Effort: Pulmonary effort is normal. No respiratory distress.  Musculoskeletal:        General: Normal range of motion.  Skin:    General: Skin is warm and dry.  Neurological:     Mental Status: He is alert and oriented to person, place, and time.     Motor: Weakness present.  Psychiatric:        Mood and Affect: Mood normal.        Thought Content: Thought content normal.             Vital Signs: BP 127/68 (BP Location: Right Arm)   Pulse 89   Temp 98 F (36.7 C)   Resp 17   Ht 5' 10 (1.778 m)   Wt 72.3 kg   SpO2 98%   BMI 22.87 kg/m  SpO2: SpO2: 98 % O2 Device: O2 Device: Room Air O2 Flow Rate: O2 Flow Rate (L/min): 3 L/min   Palliative Care Assessment & Plan   Assessment/Recommendation/Plan  Overall poor prognosis PMT to continue to follow for ongoing needs and support  Thank you for allowing the Palliative Medicine Team to assist in the care of this patient.  Total time:  25 minutes  Time spent includes: Detailed review of medical records (labs, imaging, vital signs), medically appropriate exam (mental status, respiratory, cardiac, skin), discussed  with treatment team, counseling and educating patient, family and staff, documenting clinical information, medication management and coordination of care.  Waddell Lesches, DNP, AGNP-C Palliative Medicine   Please contact Palliative Medicine Team phone at [phone] for questions and concerns.

## 2024-05-15 NOTE — Plan of Care (Signed)

## 2024-05-15 NOTE — Progress Notes (Addendum)
 Mobility Specialist - Progress Note     05/15/24 1200  Mobility  Activity Ambulated with assistance;Pivoted/transferred from bed to chair;Dangled on edge of bed  Level of Assistance Contact guard assist, steadying assist  Assistive Device Wheelchair (HHA)  Distance Ambulated (ft) 7 ft  Range of Motion/Exercises Active  Activity Response Tolerated well  Mobility Referral Yes  Mobility visit 1 Mobility  Mobility Specialist Start Time (ACUTE ONLY) 1128  Mobility Specialist Stop Time (ACUTE ONLY) 1200  Mobility Specialist Time Calculation (min) (ACUTE ONLY) 32 min   Pt resting in chair across from bed on 3L upon entry. Pt STS and ambulates to wheelchair in hallway HHA CGA with no AD. Pt wheeled out to medical mall outside area and back to room. Pt walked to bed and left in bed with needs in reach and bed alarm activated.   Guido Rumble Mobility Specialist 05/15/24, 3:02 PM

## 2024-05-16 DIAGNOSIS — Z789 Other specified health status: Secondary | ICD-10-CM

## 2024-05-16 DIAGNOSIS — J189 Pneumonia, unspecified organism: Secondary | ICD-10-CM | POA: Diagnosis not present

## 2024-05-16 DIAGNOSIS — J984 Other disorders of lung: Secondary | ICD-10-CM | POA: Diagnosis not present

## 2024-05-16 DIAGNOSIS — Z515 Encounter for palliative care: Secondary | ICD-10-CM | POA: Diagnosis not present

## 2024-05-16 DIAGNOSIS — Z7189 Other specified counseling: Secondary | ICD-10-CM | POA: Diagnosis not present

## 2024-05-16 LAB — BASIC METABOLIC PANEL WITH GFR
Anion gap: 9 (ref 5–15)
BUN: 26 mg/dL — ABNORMAL HIGH (ref 8–23)
CO2: 26 mmol/L (ref 22–32)
Calcium: 8.1 mg/dL — ABNORMAL LOW (ref 8.9–10.3)
Chloride: 98 mmol/L (ref 98–111)
Creatinine, Ser: 1.15 mg/dL (ref 0.61–1.24)
GFR, Estimated: >60 mL/min (ref >60)
Glucose, Bld: 150 mg/dL — ABNORMAL HIGH (ref 70–99)
Potassium: 3.5 mmol/L (ref 3.5–5.1)
Sodium: 133 mmol/L — ABNORMAL LOW (ref 135–145)

## 2024-05-16 LAB — CBC WITH DIFFERENTIAL/PLATELET
Abs Immature Granulocytes: 0.08 K/uL — ABNORMAL HIGH (ref 0.00–0.07)
Basophils Absolute: 0 K/uL (ref 0.0–0.1)
Basophils Relative: 0 %
Eosinophils Absolute: 0 K/uL (ref 0.0–0.5)
Eosinophils Relative: 1 %
HCT: 30.5 % — ABNORMAL LOW (ref 39.0–52.0)
Hemoglobin: 9.4 g/dL — ABNORMAL LOW (ref 13.0–17.0)
Immature Granulocytes: 1 %
Lymphocytes Relative: 6 %
Lymphs Abs: 0.5 K/uL — ABNORMAL LOW (ref 0.7–4.0)
MCH: 25.1 pg — ABNORMAL LOW (ref 26.0–34.0)
MCHC: 30.8 g/dL (ref 30.0–36.0)
MCV: 81.6 fL (ref 80.0–100.0)
Monocytes Absolute: 0.7 K/uL (ref 0.1–1.0)
Monocytes Relative: 8 %
Neutro Abs: 7.2 K/uL (ref 1.7–7.7)
Neutrophils Relative %: 84 %
Platelets: 185 K/uL (ref 150–400)
RBC: 3.74 MIL/uL — ABNORMAL LOW (ref 4.22–5.81)
RDW: 19.9 % — ABNORMAL HIGH (ref 11.5–15.5)
WBC: 8.6 K/uL (ref 4.0–10.5)
nRBC: 0 % (ref 0.0–0.2)

## 2024-05-16 NOTE — Plan of Care (Signed)
  Problem: Clinical Measurements: Goal: Cardiovascular complication will be avoided Outcome: Progressing   Problem: Activity: Goal: Risk for activity intolerance will decrease Outcome: Progressing   Problem: Nutrition: Goal: Adequate nutrition will be maintained Outcome: Progressing   Problem: Elimination: Goal: Will not experience complications related to urinary retention Outcome: Progressing   Problem: Pain Managment: Goal: General experience of comfort will improve and/or be controlled Outcome: Progressing   Problem: Skin Integrity: Goal: Risk for impaired skin integrity will decrease Outcome: Progressing   Problem: Education: Goal: Knowledge of disease or condition will improve Outcome: Progressing   Problem: Activity: Goal: Ability to tolerate increased activity will improve Outcome: Progressing

## 2024-05-16 NOTE — Plan of Care (Signed)
 The patient slept most of the night. The patient has been irritable. Dyspnea noted with exercertion.

## 2024-05-16 NOTE — TOC Progression Note (Addendum)
 Transition of Care [facility]) - Progression Note    Patient Details  Name: Timothy Matthews MRN: [id number] Date of Birth: [birth year]  Transition of Care [facility]) CM/SW Contact  Dalia GORMAN Fuse, RN Phone Number: 05/16/2024, 2:47 PM  Clinical Narrative:     [facility] [phone] ext [phone] and didn't get an answer. TOC sent a secure email to [email or web address] requesting a callback with steps to initiate the transfer to the [facility] medical facility.    15:30: TOC received call from Preston at the [facility], he will email [facility] Transfer Request Form.  TOC sent secure email with the signed Transfer form, H&P, Progress, Notes, and MAR to [email or web address].   TOC will continue to follow.                     Expected Discharge Plan and Services                                               Social Drivers of Health (SDOH) Interventions SDOH Screenings   Food Insecurity: No Food Insecurity (05/06/2024)  Housing: Low Risk  (05/06/2024)  Transportation Needs: Unmet Transportation Needs (05/06/2024)  Utilities: Not At Risk (05/06/2024)  Social Connections: Moderately Integrated (05/06/2024)  Tobacco Use: High Risk (05/05/2024)    Readmission Risk Interventions     No data to display

## 2024-05-16 NOTE — Progress Notes (Signed)
 Palliative Care Progress Note, Assessment & Plan   Patient Name: Timothy Matthews       Date: 05/16/2024 DOB: [birth year]  Age: 78 y.o. MRN#: [id number] Attending Physician: Timothy Drue DASEN, MD Primary Care Physician: Timothy Sonny Bradley, MD Admit Date: 05/05/2024  Subjective: Denies pain.  Ate approximately one half of breakfast tray this morning.  Did not sleep well last night.  Complains of shortness of breath but denies chest pain.  HPI: Per previous HPI: 78 y.o. male  with past medical history of emphysema, polysubstance abuse, hepatitis C, hypertension, history of prostate cancer, CVA, PTSD and recent diagnosis of right [facility] and has received [facility] and chemotherapy admitted on 05/05/2024 with worsening shortness of breath.   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  Summary of counseling/coordination of care: Extensive chart review completed prior to meeting patient including labs, vital signs, imaging, progress notes, orders, and available advanced directive documents from current and previous encounters.   After reviewing the patient's chart and assessing the patient at bedside, I spoke with patient in regards to symptom management and goals of care.   Ill-appearing, elderly male sitting upright in recliner.  He is alert and oriented.  He is able to engage in meaningful conversation this morning.  He exhibits conversational dyspnea during conversation.  He is in no distress   Spoke to Timothy Matthews (niece) via phone prior to  visiting patient.  Timothy Matthews expresses wanting her uncle to take advantage of the [facility] benefits and treatment if he is a candidate.  She understands if he is unable to receive treatment he would still have resources for palliative/hospice care at the [facility].  Timothy Matthews states that she called [facility] transfer [facility] this morning and was advised that the hospitalist for her father needs to contact [facility] hospital[facility].  Information was relayed to Dr. Dorinda and TOC.  Discussed with patient earlier conversation with Timothy Matthews (niece) about possible transfer to [facility].  Timothy Matthews is in agreement with this and willing to transfer to [facility] to see if they offer additional or alternative treatments.  Therapeutic silence and active listening provided for patient/niece to share their thoughts and emotions regarding current medical situation.  Emotional support provided.  Physical Exam Vitals reviewed.  Constitutional:      General: He is not in acute distress.    Appearance: He is ill-appearing.  HENT:     Head: Normocephalic and atraumatic.     Mouth/Throat:     Mouth: Mucous membranes are moist.  Pulmonary:     Effort: No respiratory distress.     Comments: Conversational dyspnea Musculoskeletal:     Right lower leg: Edema present.     Left lower leg: Edema present.  Skin:    General: Skin is warm and dry.  Neurological:     Mental Status: He is alert. Mental status is at baseline.  Psychiatric:        Mood and Affect: Mood normal.  Behavior: Behavior normal.        Thought Content: Thought content normal.        Judgment: Judgment normal.    Recommendations/Plan: FULL CODE status as previously documented    Continue current supportive interventions Plan to transfer to [facility] if accepted         Total Time 50 minutes   Discussed plan of care with Dr. Dorinda, [facility] and primary RN.  Time spent includes: Detailed review of medical records (labs, imaging, vital signs), medically appropriate exam  (mental status, respiratory, cardiac, skin), discussed with treatment team, counseling and educating patient, family and staff, documenting clinical information, medication management and coordination of care.     Timothy Matthews, Timothy Matthews [facility] Palliative Medicine Team  05/16/2024 9:54 AM  Office [phone]  Pager [phone]

## 2024-05-16 NOTE — Progress Notes (Signed)
 Physical Therapy Treatment Patient Details Name: Timothy Matthews MRN: [id number] DOB: 1946/04/16 Today's Date: 05/16/2024   History of Present Illness Pt is a 78 y/o M admitted on 05/05/24 after presenting with c/o worsening dyspnea with cough & sputum production, as well as wheezing. 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 Pt underwent bronchoscopy & pathology results showed squamous cell carcinoma of the RUL. PMH: emphysema, cocaine abuse, GERD, Hep C, HTN, prostate CA, lung CA, PTSD, CVA    PT Comments  Pt is making gradual progress towards goals, however continues to be self limiting with mobility efforts. Pt received in recliner and noted to be soiled. Agreed to stand for linen change, however declines ambulation or there-ex. Noted B LE swelling with skin indention, educated to maintain LEs elevated, socks rolled down and educated on AP to assist with fluid movement. All mobility performed on 3.5 L of O2. Will continue to progress as able.    If plan is discharge home, recommend the following: A little help with walking and/or transfers;A little help with bathing/dressing/bathroom;Assistance with cooking/housework;Help with stairs or ramp for entrance;Assist for transportation   Can travel by private vehicle     Yes  Equipment Recommendations  Rolling walker (2 wheels);BSC/3in1    Recommendations for Other Services       Precautions / Restrictions Precautions Precautions: Fall Recall of Precautions/Restrictions: Impaired Restrictions Weight Bearing Restrictions Per Provider Order: No     Mobility  Bed Mobility               General bed mobility comments: NT in recliner pre/post session    Transfers Overall transfer level: Needs assistance Equipment used: 1 person hand held assist Transfers: Sit to/from Stand Sit to Stand: Min assist           General transfer comment: post lean noted and B LE  weakness. Heavy reliance on HHA through therapist. Once standing, flexed posture noted. Declines further attempts for transfer due to fatigue    Ambulation/Gait               General Gait Details: declines any attempt for gait training   Stairs             Wheelchair Mobility     Tilt Bed    Modified Rankin (Stroke Patients Only)       Balance Overall balance assessment: Needs assistance Sitting-balance support: Feet supported Sitting balance-Leahy Scale: Good     Standing balance support: No upper extremity supported, During functional activity Standing balance-Leahy Scale: Fair                              Hotel manager: Impaired Factors Affecting Communication: Reduced clarity of speech  Cognition Arousal: Alert Behavior During Therapy: WFL for tasks assessed/performed   PT - Cognitive impairments: No apparent impairments                       PT - Cognition Comments: somewhat irritable. Is able to relay health information to visitors but also doesn't comprehend potential deficits of sitting in an urine soaked chair Following commands: Intact      Cueing Cueing Techniques: Verbal cues  Exercises Other Exercises Other Exercises: pt noted to be soiled in recliner. Assisted with linen change includingchair linen and gown    General Comments General comments (skin integrity, edema, etc.): on RA  on entry with sp02 at 76%, required 4L to recover to 93% after 2-3 mins; edu on PLB provided and pt still at 90% on OT exit requiring 5L with nurse notified      Pertinent Vitals/Pain Pain Assessment Pain Assessment: No/denies pain    Home Living                          Prior Function            PT Goals (current goals can now be found in the care plan section) Acute Rehab PT Goals Patient Stated Goal: get warm PT Goal Formulation: With patient Time For Goal Achievement:  05/27/24 Potential to Achieve Goals: Good Progress towards PT goals: Progressing toward goals    Frequency    Min 2X/week      PT Plan      Co-evaluation              AM-PAC PT 6 Clicks Mobility   Outcome Measure  Help needed turning from your back to your side while in a flat bed without using bedrails?: None Help needed moving from lying on your back to sitting on the side of a flat bed without using bedrails?: A Little Help needed moving to and from a bed to a chair (including a wheelchair)?: A Little Help needed standing up from a chair using your arms (e.g., wheelchair or bedside chair)?: A Little Help needed to walk in hospital room?: A Little Help needed climbing 3-5 steps with a railing? : A Lot 6 Click Score: 18    End of Session Equipment Utilized During Treatment: Oxygen Activity Tolerance:  (pt self limiting) Patient left: in chair;with call bell/phone within reach;with chair alarm set;with nursing/sitter in room Nurse Communication: Mobility status PT Visit Diagnosis: Muscle weakness (generalized) (M62.81);Other abnormalities of gait and mobility (R26.89)     Time: 8644-8587 PT Time Calculation (min) (ACUTE ONLY): 17 min  Charges:    $Therapeutic Activity: 8-22 mins PT General Charges $$ ACUTE PT VISIT: 1 Visit                     Timothy Matthews, PT, DPT, GCS [phone]    Timothy Matthews 05/16/2024, 3:48 PM

## 2024-05-16 NOTE — Progress Notes (Signed)
 Occupational Therapy Treatment Patient Details Name: Timothy Matthews MRN: [id number] DOB: 1946-08-30 Today's Date: 05/16/2024   History of present illness Pt is a 78 y/o M admitted on 05/05/24 after presenting with c/o worsening dyspnea with cough & sputum production, as well as wheezing. 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 Pt underwent bronchoscopy & pathology results showed squamous cell carcinoma of the RUL. PMH: emphysema, cocaine abuse, GERD, Hep C, HTN, prostate CA, lung CA, PTSD, CVA   OT comments  Pt is seated in recliner with nurse provided peri-care on entry. Easily arousable and agreeable to OT session. He denies pain. Pt found on RA on entry with sp02 at 76%, required 4L to recover to 93% after 2-3 mins. OT provided edu on PLB throughout session. Pt able to stand multiple times from recliner with CGA, utilized urinal with CGA for balance in standing. Needed gown and sock change d/t incont onto the floor. Mod A for LB dressing and Min A for UB dressing seated in recliner. Pt with another drop in 02 to 84%, required 5L to increase to 90% on OT exit with nurse notified. Pt returned to recliner with all needs in place and will cont to require skilled acute OT services to maximize his safety and IND to return to PLOF.       If plan is discharge home, recommend the following:  A little help with bathing/dressing/bathroom;A little help with walking and/or transfers;Direct supervision/assist for medications management   Equipment Recommendations  Other (comment) (defer to next venue)    Recommendations for Other Services      Precautions / Restrictions Precautions Precautions: Fall Recall of Precautions/Restrictions: Impaired Restrictions Weight Bearing Restrictions Per Provider Order: No       Mobility Bed Mobility               General bed mobility comments: NT in recliner pre/post session     Transfers Overall transfer level: Needs assistance Equipment used: Rolling walker (2 wheels) Transfers: Sit to/from Stand Sit to Stand: Contact guard assist           General transfer comment: CGA for STS from recliner x2 during session for toileting needs; pt declining ambulation this date     Balance Overall balance assessment: Needs assistance Sitting-balance support: Feet supported Sitting balance-Leahy Scale: Good     Standing balance support: No upper extremity supported, During functional activity Standing balance-Leahy Scale: Fair Standing balance comment: CGA to stand without AD to use urinal                           ADL either performed or assessed with clinical judgement   ADL Overall ADL's : Needs assistance/impaired                 Upper Body Dressing : Minimal assistance;Sitting Upper Body Dressing Details (indicate cue type and reason): doff/don gown Lower Body Dressing: Moderate assistance;Sitting/lateral leans;Sit to/from stand Lower Body Dressing Details (indicate cue type and reason): able to doff soiled sock, but needed Mod A to don clean one Toilet Transfer: Contact guard assist Toilet Transfer Details (indicate cue type and reason): stood to utilize urinal, refused to ambulate to commode;CGA for STS from recliner Toileting- Clothing Manipulation and Hygiene: Contact guard assist;Minimal assistance;Sit to/from stand;Sitting/lateral lean Toileting - Clothing Manipulation Details (indicate cue type and reason): to manage urinal and some urination in the floor requiring clean  up and gown change            Extremity/Trunk Assessment              Vision       Perception     Praxis     Communication Communication Communication: Impaired Factors Affecting Communication: Reduced clarity of speech   Cognition Arousal: Alert Behavior During Therapy: WFL for tasks assessed/performed (irritable) Cognition: Cognition  impaired     Awareness: Intellectual awareness impaired, Online awareness impaired   Attention impairment (select first level of impairment): Sustained attention Executive functioning impairment (select all impairments): Reasoning, Problem solving                   Following commands: Intact        Cueing   Cueing Techniques: Verbal cues  Exercises Other Exercises Other Exercises: Edu on PLB techniques during session    Shoulder Instructions       General Comments on RA on entry with sp02 at 76%, required 4L to recover to 93% after 2-3 mins; edu on PLB provided and pt still at 90% on OT exit requiring 5L with nurse notified    Pertinent Vitals/ Pain       Pain Assessment Pain Assessment: No/denies pain  Home Living                                          Prior Functioning/Environment              Frequency  Min 2X/week        Progress Toward Goals  OT Goals(current goals can now be found in the care plan section)  Progress towards OT goals: Progressing toward goals  Acute Rehab OT Goals Patient Stated Goal: get stronger and improve function OT Goal Formulation: With patient Time For Goal Achievement: 05/27/24 Potential to Achieve Goals: Good  Plan      Co-evaluation                 AM-PAC OT 6 Clicks Daily Activity     Outcome Measure   Help from another person eating meals?: None Help from another person taking care of personal grooming?: None Help from another person toileting, which includes using toliet, bedpan, or urinal?: A Little Help from another person bathing (including washing, rinsing, drying)?: A Little Help from another person to put on and taking off regular upper body clothing?: A Little Help from another person to put on and taking off regular lower body clothing?: A Lot 6 Click Score: 19    End of Session Equipment Utilized During Treatment: Oxygen  OT Visit Diagnosis: Other abnormalities of  gait and mobility (R26.89);Muscle weakness (generalized) (M62.81)   Activity Tolerance Patient tolerated treatment well;Patient limited by fatigue   Patient Left in chair;with call bell/phone within reach;with chair alarm set   Nurse Communication Mobility status        Time: 1010-1038 OT Time Calculation (min): 28 min  Charges: OT General Charges $OT Visit: 1 Visit OT Treatments $Self Care/Home Management : 23-37 mins  Leiya Keesey, OTR/L  05/16/24, 12:58 PM   Nyle Limb E Terrell Shimko 05/16/2024, 12:55 PM

## 2024-05-17 DIAGNOSIS — J984 Other disorders of lung: Secondary | ICD-10-CM | POA: Diagnosis not present

## 2024-05-17 DIAGNOSIS — J189 Pneumonia, unspecified organism: Secondary | ICD-10-CM | POA: Diagnosis not present

## 2024-05-17 DIAGNOSIS — Z7189 Other specified counseling: Secondary | ICD-10-CM | POA: Diagnosis not present

## 2024-05-17 LAB — BASIC METABOLIC PANEL WITH GFR
Anion gap: 7 (ref 5–15)
BUN: 27 mg/dL — ABNORMAL HIGH (ref 8–23)
CO2: 28 mmol/L (ref 22–32)
Calcium: 8.1 mg/dL — ABNORMAL LOW (ref 8.9–10.3)
Chloride: 100 mmol/L (ref 98–111)
Creatinine, Ser: 1.16 mg/dL (ref 0.61–1.24)
GFR, Estimated: >60 mL/min (ref >60)
Glucose, Bld: 125 mg/dL — ABNORMAL HIGH (ref 70–99)
Potassium: 3.6 mmol/L (ref 3.5–5.1)
Sodium: 135 mmol/L (ref 135–145)

## 2024-05-17 LAB — CBC WITH DIFFERENTIAL/PLATELET
Abs Immature Granulocytes: 0.05 K/uL (ref 0.00–0.07)
Basophils Absolute: 0 K/uL (ref 0.0–0.1)
Basophils Relative: 0 %
Eosinophils Absolute: 0.1 K/uL (ref 0.0–0.5)
Eosinophils Relative: 1 %
HCT: 29.1 % — ABNORMAL LOW (ref 39.0–52.0)
Hemoglobin: 9.2 g/dL — ABNORMAL LOW (ref 13.0–17.0)
Immature Granulocytes: 1 %
Lymphocytes Relative: 9 %
Lymphs Abs: 0.9 K/uL (ref 0.7–4.0)
MCH: 25.9 pg — ABNORMAL LOW (ref 26.0–34.0)
MCHC: 31.6 g/dL (ref 30.0–36.0)
MCV: 82 fL (ref 80.0–100.0)
Monocytes Absolute: 0.8 K/uL (ref 0.1–1.0)
Monocytes Relative: 8 %
Neutro Abs: 7.9 K/uL — ABNORMAL HIGH (ref 1.7–7.7)
Neutrophils Relative %: 81 %
Platelets: 196 K/uL (ref 150–400)
RBC: 3.55 MIL/uL — ABNORMAL LOW (ref 4.22–5.81)
RDW: 19.9 % — ABNORMAL HIGH (ref 11.5–15.5)
WBC: 9.7 K/uL (ref 4.0–10.5)
nRBC: 0 % (ref 0.0–0.2)

## 2024-05-17 NOTE — Plan of Care (Signed)
 The patient was having a frequent productive cough with thick white mucus noted. PRN Tussionex 5ml and SVN Tx was given with good effect.

## 2024-05-17 NOTE — Progress Notes (Signed)
 Daily Progress Note   Patient Name: Timothy Matthews       Date: 05/17/2024 DOB: 1946/09/16  Age: 78 y.o. MRN#: [id number] Attending Physician: Dorinda Drue DASEN, MD Primary Care Physician: Dorise Sonny Bradley, MD Admit Date: 05/05/2024  Reason for Consultation/Follow-up: Establishing goals of care  Subjective: Notes and labs reviewed.  Spoke with attending team for updates.  Patient and niece are hoping for patient to transfer to the [facility] for further care and discussion of options.  In to see patient, he simply confirms this request.  Staff members in to work with patient.  PMT will shadow for needs.  Length of Stay: 12  Current Medications: Scheduled Meds:   atorvastatin   40 mg Oral Daily   baclofen   5 mg Oral BID   benzonatate   100 mg Oral BID   budesonide -glycopyrrolate -formoterol   2 puff Inhalation BID   enoxaparin  (LOVENOX ) injection  40 mg Subcutaneous Q24H   furosemide   40 mg Intravenous BID   gabapentin   600 mg Oral TID   guaiFENesin   600 mg Oral BID   tamsulosin   0.4 mg Oral QHS    Continuous Infusions:   PRN Meds: acetaminophen  **OR** acetaminophen , chlorpheniramine-HYDROcodone , ipratropium-albuterol , magnesium  hydroxide, melatonin, morphine  injection, ondansetron  **OR** ondansetron  (ZOFRAN ) IV, mouth rinse, traZODone   Physical Exam Pulmonary:     Effort: Pulmonary effort is normal.  Neurological:     Mental Status: He is alert.             Vital Signs: BP 108/82 (BP Location: Left Arm)   Pulse 88   Temp 99.3 F (37.4 C) (Oral)   Resp 18   Ht 5' 10 (1.778 m)   Wt 72.3 kg   SpO2 92%   BMI 22.87 kg/m  SpO2: SpO2: 92 % O2 Device: O2 Device: Nasal Cannula O2 Flow Rate: O2 Flow Rate (L/min): 5 L/min  Intake/output summary:  Intake/Output Summary (Last  24 hours) at 05/17/2024 1328 Last data filed at 05/17/2024 1045 Gross per 24 hour  Intake 240 ml  Output 2351 ml  Net -2111 ml   LBM: Last BM Date : 05/12/24 Baseline Weight: Weight: 72.3 kg Most recent weight: Weight: 72.3 kg       Patient Active Problem List   Diagnosis Date Noted   Goals of care, counseling/discussion 05/13/2024   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) 05/13/2024   Postobstructive pneumonia 05/09/2024   Dyslipidemia 05/06/2024   BPH (benign prostatic hyperplasia) 05/06/2024   Peripheral neuropathy 05/06/2024   History of CVA (cerebrovascular accident) 05/06/2024   Essential hypertension 05/06/2024   Cavitary pneumonia 05/05/2024   COPD exacerbation (HCC) 04/20/2024   Necrotizing pneumonia (HCC) 03/28/2024   Cancer of right lung (HCC) 03/25/2024   Community acquired pneumonia 03/25/2024   Cocaine abuse with cocaine-induced mood disorder (HCC) 09/04/2022   Anxiety state 09/02/2022   Medication reaction 09/02/2022   CVA (cerebral vascular accident) (HCC) 10/23/2020   Cocaine abuse (HCC) 10/23/2020   HTN (hypertension) 10/23/2020   Emphysema lung (HCC) 10/23/2020   GERD (gastroesophageal reflux disease) 10/23/2020    Palliative Care Assessment & Plan    Recommendations/Plan: [facility]  Code Status:    Code Status Orders  (From admission, onward)           Start     Ordered   05/05/24 2257  Full code  Continuous       Question:  By:  Answer:  Consent: discussion documented in EHR   05/05/24 2258           Code Status History     Date Active Date Inactive Code Status Order ID Comments User Context   04/20/2024 0243 04/20/2024 2344 Full Code [id number]  Cleatus Delayne GAILS, MD ED   03/25/2024 1825 03/28/2024 2330 Full Code [id number]  Cox, Amy N, DO ED   10/23/2020 1342 10/30/2020 1903 Full Code [id number]  Harlow Ozell BRAVO, MD ED   04/07/2020 1703 04/09/2020 1445 Full Code [id number]  Willo Dunnings, MD  ED    Thank you for allowing the Palliative Medicine Team to assist in the care of this patient.   Camelia Lewis, NP  Please contact Palliative Medicine Team phone at [phone] for questions and concerns.

## 2024-05-17 NOTE — Progress Notes (Signed)
 Progress Note   Patient: Timothy Matthews FMW:[id number] DOB: 1946/06/22 DOA: 05/05/2024     12 DOS: the patient was seen and examined on 05/16/2024    Brief hospital course:   78 y.o. male with medical history significant for emphysema, cocaine abuse, GERD, hepatitis C, hypertension, prostate cancer, lung cancer,    PTSD and CVA, who presented to the emergency room with acute onset of worsening dyspnea with associated cough productive of thick whitish sputum as well as wheezing over the last week with no fever or chills.  Found to have exacerbation of his postobstructive pneumonia right upper lobe.   Assessment and Plan:   Acute on chronic postobstructive cavitary pneumonia in the setting of squamous cell carcinoma of the right lung Acute hypoxic respiratory failure secondary to above-present on admission - Known right upper lobe mass, multiple hospitalizations for pneumonia.  Complaining of worsening cough productive sputum on presentation.  Likely postobstructive secondary to mass with developing cavitary pneumonia.  Was supposed to receive bronch 6/16 however was canceled due to ongoing cocaine use.  Has completed 7 to 10 days course of Unasyn   MRSA swab negative so discontinued vancomycin .  Repeat CT imaging 7/27 noting progression and interval enlargement of cavitation.   Pulmonology consulted and underwent bronchoscopy with EBUS on 05/10/2024.   Pathology results came back showing squamous cell carcinoma of the right upper lobe that is a potential explanation of the cavitary lesion Continue to monitor closely   Acute COPD exacerbation - Exacerbated by above. Has completed corticosteroid therapy Continue, nebulizers, mucolytic's, antibiotics as above.   Continue supplemental oxygen as needed   Acute exacerbation of chronic HFpEF - Noted worsening BL LE edema.  Likely exacerbated by IV fluids and antibiotic use.  No worsening shortness of breath.  Echocardiogram showing findings of  grade 1 diastolic dysfunction with a EF of 60 to 65% Continue IV Lasix  to twice daily Continue to monitor input and output closely   RUL lung adenocarcinoma - Reportedly is followed closely at the [place]. status post chemoradiation. Bronchoscopy done was done on 05/10/2024 showed findings of squamous cell carcinoma of the right upper lobe Pulmonologist following Oncology was consulted and case discussed   History of CVA - Aspirin  on board   Peripheral neuropathy - Neurontin  on board.   BPH Continue Flomax .   Noncompliance with medical regimen - History of leaving AMA.  [facility].  States he completed chemo/radiation.       Subjective:  Patient seen and examined at bedside Continues to be on 4 L of oxygen TOC has begun initial paperwork for transfer to the [facility] He denies any acute overnight event   Physical Exam:   GENERAL: Alert and awake on intranasal oxygen HEENT:  EOMI, nasal cannula CARDIOVASCULAR:  RRR, no murmurs appreciated RESPIRATORY: Dry cough, poor air movement GASTROINTESTINAL:  Soft, nontender, nondistended EXTREMITIES: BL LE pitting edema NEURO:  No new focal deficits appreciated SKIN:  No rashes noted PSYCH:  Appropriate mood and affect        Data Reviewed: I have reviewed patient's labs today Vitals:   05/16/24 1500 05/16/24 2046 05/17/24 0424 05/17/24 0819  BP: 104/66 114/68 107/76 108/82  Pulse: 95 86 88 88  Resp: 18 18 20 18   Temp:  98.1 F (36.7 C) 98.3 F (36.8 C) 99.3 F (37.4 C)  TempSrc:  Oral Oral Oral  SpO2: 92% 96% 93% 92%  Weight:      Height:  Author: Drue ONEIDA Potter, MD 05/17/2024 9:43 AM  For on call review [email or web address].

## 2024-05-17 NOTE — TOC Progression Note (Addendum)
 Transition of Care [facility]) - Progression Note    Patient Details  Name: Timothy Matthews MRN: [id number] Date of Birth: Sep 10, 1946  Transition of Care [facility]) CM/SW Contact  Dalia GORMAN Fuse, RN Phone Number: 05/17/2024, 3:28 PM  Clinical Narrative:     WONDA dama to [place] [facility] [phone] ext [phone] to check the status of the transfer request. Per [place] and additional form needs to be completed and faxed. TOC provided the fax number to send the form.   TOC received the form and completed it and emailed back to [place] at the facility.                    Expected Discharge Plan and Services                                               Social Drivers of Health (SDOH) Interventions SDOH Screenings   Food Insecurity: No Food Insecurity (05/06/2024)  Housing: Low Risk  (05/06/2024)  Transportation Needs: Unmet Transportation Needs (05/06/2024)  Utilities: Not At Risk (05/06/2024)  Social Connections: Moderately Integrated (05/06/2024)  Tobacco Use: High Risk (05/05/2024)    Readmission Risk Interventions     No data to display

## 2024-05-17 NOTE — Plan of Care (Signed)
   Problem: Clinical Measurements: Goal: Will remain free from infection Outcome: Progressing Goal: Diagnostic test results will improve Outcome: Progressing Goal: Respiratory complications will improve Outcome: Progressing Goal: Cardiovascular complication will be avoided Outcome: Progressing

## 2024-05-17 NOTE — Progress Notes (Signed)
 Progress Note   Patient: Timothy Matthews FMW:[id number] DOB: Feb 08, 1946 DOA: 05/05/2024     12 DOS: the patient was seen and examined on 05/17/2024     Brief hospital course:   78 y.o. male with medical history significant for emphysema, cocaine abuse, GERD, hepatitis C, hypertension, prostate cancer, lung cancer,    PTSD and CVA, who presented to the emergency room with acute onset of worsening dyspnea with associated cough productive of thick whitish sputum as well as wheezing over the last week with no fever or chills.  Found to have exacerbation of his postobstructive pneumonia right upper lobe.  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  The rest of hospital course as outlined below   Assessment and Plan:   Acute on chronic postobstructive cavitary pneumonia in the setting of squamous cell carcinoma of the right lung Acute hypoxic respiratory failure secondary to above-present on admission - Known right upper lobe mass, multiple hospitalizations for pneumonia.  Complaining of worsening cough productive sputum on presentation.  Likely postobstructive secondary to mass with developing cavitary pneumonia.   Was supposed to receive bronch 6/16 however was canceled due to ongoing cocaine use.   Has completed 7 to 10 days course of Unasyn   MRSA swab negative so discontinued vancomycin .  Repeat CT imaging 7/27 noting progression and interval enlargement of cavitation.   Pulmonology consulted and underwent bronchoscopy with EBUS on 05/10/2024.   Pathology results came back showing squamous cell carcinoma of the right upper lobe that is a potential explanation of the cavitary lesion Patient and family have requested for transfer to the [facility] more so in case he needs hospice services to be able to obtain that through the [facility]. Palliative care on board Continue to monitor closely   Acute COPD exacerbation - Exacerbated by above. Has completed corticosteroid  therapy Continue, nebulizers, mucolytic's, antibiotics as above.   Continue supplemental oxygen as needed   Acute exacerbation of chronic HFpEF - Noted worsening BL LE edema.  Likely exacerbated by IV fluids and antibiotic use.  No worsening shortness of breath.  Echocardiogram showing findings of grade 1 diastolic dysfunction with a EF of 60 to 65% Continue IV Lasix  to twice daily Continue to monitor input and output closely   RUL lung adenocarcinoma - Reportedly is followed closely at the [place]. status post chemoradiation. Bronchoscopy done was done on 05/10/2024 showed findings of squamous cell carcinoma of the right upper lobe Pulmonologist saw patient and have since signed off Oncology was consulted and case discussed   History of CVA Continue statin therapy   Peripheral neuropathy Continue Neurontin    BPH Continue Flomax .   Noncompliance with medical regimen - History of leaving AMA.  [facility].  States he completed chemo/radiation.       Subjective:  Patient seen and examined at bedside Oxygen requirement has improved from 4 L to 2 L today TOC has begun initial paperwork for transfer to the [place] He denies any acute overnight event   Physical Exam:   GENERAL: Alert and awake on intranasal oxygen HEENT:  EOMI, nasal cannula CARDIOVASCULAR:  RRR, no murmurs appreciated RESPIRATORY: Dry cough, poor air movement GASTROINTESTINAL:  Soft, nontender, nondistended EXTREMITIES: BL LE pitting edema NEURO:  No new focal deficits appreciated SKIN:  No rashes noted PSYCH:  Appropriate mood and affect        Data Reviewed: I have reviewed patient's labs today  Vitals:  05/16/24 1500 05/16/24 2046 05/17/24 0424 05/17/24 0819  BP: 104/66 114/68 107/76 108/82  Pulse: 95 86 88 88  Resp: 18 18 20 18   Temp:  98.1 F (36.7 C) 98.3 F (36.8 C) 99.3 F (37.4 C)  TempSrc:  Oral Oral Oral  SpO2: 92% 96% 93% 92%  Weight:       Height:          Latest Ref Rng & Units 05/17/2024    5:21 AM 05/16/2024    5:35 AM 05/14/2024    5:24 AM  CBC  WBC 4.0 - 10.5 K/uL 9.7  8.6  11.5   Hemoglobin 13.0 - 17.0 g/dL 9.2  9.4  89.6   Hematocrit 39.0 - 52.0 % 29.1  30.5  33.6   Platelets 150 - 400 K/uL 196  185  212        Latest Ref Rng & Units 05/17/2024    5:21 AM 05/16/2024    5:35 AM 05/14/2024    5:24 AM  BMP  Glucose 70 - 99 mg/dL 874  849  887   BUN 8 - 23 mg/dL 27  26  29    Creatinine 0.61 - 1.24 mg/dL 8.83  8.84  8.99   Sodium 135 - 145 mmol/L 135  133  133   Potassium 3.5 - 5.1 mmol/L 3.6  3.5  4.0   Chloride 98 - 111 mmol/L 100  98  95   CO2 22 - 32 mmol/L 28  26  26    Calcium  8.9 - 10.3 mg/dL 8.1  8.1  8.4       Author: Drue ONEIDA Potter, MD 05/17/2024 12:39 PM  For on call review [email or web address].

## 2024-05-18 DIAGNOSIS — J189 Pneumonia, unspecified organism: Secondary | ICD-10-CM | POA: Diagnosis not present

## 2024-05-18 DIAGNOSIS — Z7189 Other specified counseling: Secondary | ICD-10-CM | POA: Diagnosis not present

## 2024-05-18 DIAGNOSIS — J984 Other disorders of lung: Secondary | ICD-10-CM | POA: Diagnosis not present

## 2024-05-18 LAB — CBC WITH DIFFERENTIAL/PLATELET
Abs Immature Granulocytes: 0.06 K/uL (ref 0.00–0.07)
Basophils Absolute: 0 K/uL (ref 0.0–0.1)
Basophils Relative: 0 %
Eosinophils Absolute: 0.1 K/uL (ref 0.0–0.5)
Eosinophils Relative: 1 %
HCT: 30.3 % — ABNORMAL LOW (ref 39.0–52.0)
Hemoglobin: 9.5 g/dL — ABNORMAL LOW (ref 13.0–17.0)
Immature Granulocytes: 1 %
Lymphocytes Relative: 9 %
Lymphs Abs: 0.9 K/uL (ref 0.7–4.0)
MCH: 26 pg (ref 26.0–34.0)
MCHC: 31.4 g/dL (ref 30.0–36.0)
MCV: 82.8 fL (ref 80.0–100.0)
Monocytes Absolute: 0.9 K/uL (ref 0.1–1.0)
Monocytes Relative: 8 %
Neutro Abs: 8.6 K/uL — ABNORMAL HIGH (ref 1.7–7.7)
Neutrophils Relative %: 81 %
Platelets: 213 K/uL (ref 150–400)
RBC: 3.66 MIL/uL — ABNORMAL LOW (ref 4.22–5.81)
RDW: 19.7 % — ABNORMAL HIGH (ref 11.5–15.5)
WBC: 10.6 K/uL — ABNORMAL HIGH (ref 4.0–10.5)
nRBC: 0 % (ref 0.0–0.2)

## 2024-05-18 LAB — BASIC METABOLIC PANEL WITH GFR
Anion gap: 7 (ref 5–15)
BUN: 22 mg/dL (ref 8–23)
CO2: 28 mmol/L (ref 22–32)
Calcium: 7.9 mg/dL — ABNORMAL LOW (ref 8.9–10.3)
Chloride: 97 mmol/L — ABNORMAL LOW (ref 98–111)
Creatinine, Ser: 1.05 mg/dL (ref 0.61–1.24)
GFR, Estimated: >60 mL/min (ref >60)
Glucose, Bld: 119 mg/dL — ABNORMAL HIGH (ref 70–99)
Potassium: 3.7 mmol/L (ref 3.5–5.1)
Sodium: 132 mmol/L — ABNORMAL LOW (ref 135–145)

## 2024-05-18 MED ORDER — MORPHINE SULFATE (PF) 2 MG/ML IV SOLN: 2.0000 mg | INTRAVENOUS | Status: AC | PRN

## 2024-05-18 MED ORDER — ONDANSETRON HCL 4 MG PO TABS: 4.0000 mg | ORAL_TABLET | Freq: Four times a day (QID) | ORAL | Status: AC | PRN

## 2024-05-18 MED ORDER — ENOXAPARIN SODIUM 40 MG/0.4ML IJ SOSY: 40.0000 mg | PREFILLED_SYRINGE | INTRAMUSCULAR | Status: AC

## 2024-05-18 MED ORDER — BUDESON-GLYCOPYRROL-FORMOTEROL 160-9-4.8 MCG/ACT IN AERO: 2.0000 | INHALATION_SPRAY | Freq: Two times a day (BID) | RESPIRATORY_TRACT | Status: AC

## 2024-05-18 MED ORDER — FUROSEMIDE 10 MG/ML IJ SOLN: 40.0000 mg | Freq: Two times a day (BID) | INTRAMUSCULAR | Status: AC

## 2024-05-18 MED ORDER — ORAL CARE MOUTH RINSE: 15.0000 mL | OROMUCOSAL | Status: AC | PRN

## 2024-05-18 MED ORDER — HYDROCOD POLI-CHLORPHE POLI ER 10-8 MG/5ML PO SUER: 5.0000 mL | Freq: Two times a day (BID) | ORAL | Status: AC | PRN

## 2024-05-18 MED ORDER — MAGNESIUM HYDROXIDE 400 MG/5ML PO SUSP: 30.0000 mL | Freq: Every day | ORAL | Status: AC | PRN

## 2024-05-18 MED ORDER — ACETAMINOPHEN 325 MG PO TABS: 650.0000 mg | ORAL_TABLET | Freq: Four times a day (QID) | ORAL | Status: AC | PRN

## 2024-05-18 NOTE — Progress Notes (Signed)
 Daily Progress Note   Patient Name: Timothy Matthews       Date: 05/18/2024 DOB: 11/26/1945  Age: 78 y.o. MRN#: [id number] Attending Physician: Marsa Edelman, DO Primary Care Physician: Dorise Sonny Bradley, MD Admit Date: 05/05/2024  Reason for Consultation/Follow-up: Establishing goals of care  Subjective: Notes and labs reviewed.  Into see patient.  Attending in to see him, we discussed updates and attending exited.  PMT remained at bedside.  Patient states he is hopeful to go to the [facility].  He states he is unsure if he really wants oncologic intervention, he states it depends on chance of success and on side effects.  He states if oncologic intervention is not an option, they can discuss hospice at that time.  He states he is a man of faith and knows where he will go when he departs this body, but is struggling with his status.  Therapeutic and reflective listening provided.  Length of Stay: 13  Current Medications: Scheduled Meds:   atorvastatin   40 mg Oral Daily   baclofen   5 mg Oral BID   benzonatate   100 mg Oral BID   budesonide -glycopyrrolate -formoterol   2 puff Inhalation BID   enoxaparin  (LOVENOX ) injection  40 mg Subcutaneous Q24H   furosemide   40 mg Intravenous BID   gabapentin   600 mg Oral TID   guaiFENesin   600 mg Oral BID   tamsulosin   0.4 mg Oral QHS    Continuous Infusions:   PRN Meds: acetaminophen  **OR** acetaminophen , chlorpheniramine-HYDROcodone , ipratropium-albuterol , magnesium  hydroxide, melatonin, morphine  injection, ondansetron  **OR** ondansetron  (ZOFRAN ) IV, mouth rinse, traZODone   Physical Exam Pulmonary:     Effort: Pulmonary effort is normal.  Skin:    General: Skin is warm and dry.  Neurological:     Mental Status: He is alert.             Vital  Signs: BP 107/68 (BP Location: Right Arm)   Pulse 84   Temp 98.1 F (36.7 C) (Oral)   Resp 15   Ht 5' 10 (1.778 m)   Wt 72.3 kg   SpO2 100%   BMI 22.87 kg/m  SpO2: SpO2: 100 % O2 Device: O2 Device: Nasal Cannula O2 Flow Rate: O2 Flow Rate (L/min): 5 L/min  Intake/output summary:  Intake/Output Summary (Last 24 hours) at 05/18/2024 1155 Last data filed  at 05/18/2024 0955 Gross per 24 hour  Intake 960 ml  Output 2500 ml  Net -1540 ml   LBM: Last BM Date : 05/12/24 Baseline Weight: Weight: 72.3 kg Most recent weight: Weight: 72.3 kg       Patient Active Problem List   Diagnosis Date Noted   Goals of care, counseling/discussion 05/13/2024   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) 05/13/2024   Postobstructive pneumonia 05/09/2024   Dyslipidemia 05/06/2024   BPH (benign prostatic hyperplasia) 05/06/2024   Peripheral neuropathy 05/06/2024   History of CVA (cerebrovascular accident) 05/06/2024   Essential hypertension 05/06/2024   Cavitary pneumonia 05/05/2024   COPD exacerbation (HCC) 04/20/2024   Necrotizing pneumonia (HCC) 03/28/2024   Cancer of right lung (HCC) 03/25/2024   Community acquired pneumonia 03/25/2024   Cocaine abuse with cocaine-induced mood disorder (HCC) 09/04/2022   Anxiety state 09/02/2022   Medication reaction 09/02/2022   CVA (cerebral vascular accident) (HCC) 10/23/2020   Cocaine abuse (HCC) 10/23/2020   HTN (hypertension) 10/23/2020   Emphysema lung (HCC) 10/23/2020   GERD (gastroesophageal reflux disease) 10/23/2020    Palliative Care Assessment & Plan    Recommendations/Plan:  Continue with plans for [facility] placement and further goals of care conversations at that time.   Code Status:    Code Status Orders  (From admission, onward)           Start     Ordered   05/05/24 2257  Full code  Continuous       Question:  By:  Answer:  Consent: discussion documented in EHR   05/05/24 2258           Code Status History      Date Active Date Inactive Code Status Order ID Comments User Context   04/20/2024 0243 04/20/2024 2344 Full Code [id number]  Cleatus Delayne GAILS, MD ED   03/25/2024 1825 03/28/2024 2330 Full Code [id number]  Cox, Amy LOISE, DO ED   10/23/2020 1342 10/30/2020 1903 Full Code [id number]  Harlow Ozell BRAVO, MD ED   04/07/2020 1703 04/09/2020 1445 Full Code [id number]  Willo Dunnings, MD ED       Camelia Lewis, NP  Please contact Palliative Medicine Team phone at [phone] for questions and concerns.

## 2024-05-18 NOTE — TOC Progression Note (Signed)
 Transition of Care [facility]) - Progression Note    Patient Details  Name: Timothy Matthews MRN: [id number] Date of Birth: 1946/09/24  [facility]) CM/SW Contact  Dalia GORMAN Fuse, RN Phone Number: 05/18/2024, 9:48 AM  Clinical Narrative:    .  TOC sent updated progress notes to [place] [facility] the [facility]. Per [place] the [place] was on diversion yesterday, but if appropriate he will get the patient on the list for today.                   Expected Discharge Plan and Services                                               Social Drivers of Health (SDOH) Interventions SDOH Screenings   Food Insecurity: No Food Insecurity (05/06/2024)  Housing: Low Risk  (05/06/2024)  Transportation Needs: Unmet Transportation Needs (05/06/2024)  Utilities: Not At Risk (05/06/2024)  Social Connections: Moderately Integrated (05/06/2024)  Tobacco Use: High Risk (05/05/2024)    Readmission Risk Interventions     No data to display

## 2024-05-18 NOTE — Discharge Summary (Signed)
 Physician Discharge Summary   Patient: Timothy Matthews MRN: [id number]  DOB: December 24, 1945   Admit:     Date of Admission: 05/05/2024 Admitted from: home   Discharge: Date of discharge: 05/18/24 Disposition: [facility]  Condition at discharge: fair  CODE STATUS: FULL CODE     Discharge Physician: Laneta Blunt, DO [facility]     PCP: Dorise Sonny Bradley, MD  Recommendations for Outpatient Follow-up:  Follow up pending clinical progression at the [facility]       Discharge Diagnoses: Principal Problem:   Cavitary pneumonia Active Problems:   COPD exacerbation (HCC)   Dyslipidemia   BPH (benign prostatic hyperplasia)   Peripheral neuropathy   History of CVA (cerebrovascular accident)   Essential hypertension   Postobstructive pneumonia   Goals of care, counseling/discussion   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 male with medical history significant for emphysema, cocaine abuse, GERD, hepatitis C, hypertension, prostate cancer, lung cancer,    PTSD and CVA, who presented to the emergency room with acute onset of worsening dyspnea with associated cough productive of thick whitish sputum as well as wheezing over the last week with no fever or chills.  Found to have exacerbation of his postobstructive pneumonia right upper lobe.  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   Is followed closely at the  [place]. Worsening cough productive sputum on presentation.   Was supposed to receive bronch 6/16 however was canceled due to ongoing cocaine use.   Hospital course:  Repeat CT imaging 7/27 noting progression and interval enlargement of cavitation. Pulmonology consulted. Pt underwent bronchoscopy with EBUS on 05/10/2024.  Pathology results came back showing squamous cell carcinoma of the right upper lobe that is a potential explanation of the cavitary lesion MRSA swab negative so discontinued vancomycin . Has completed course of Unasyn  Patient and family have requested for transfer to the [place] more so in case he needs hospice services to be able to obtain that through the [place]. Plan[facility]  [facility] - confirmed 05/18/24 12:25 PM they can take him to medicine service (spoke w/ Dr Jonette Fate, resident physician)     Acute COPD exacerbation Hospital course:  Exacerbated by above pneumonia Has completed corticosteroid therapy Plan:  Continue bronchodilators, O2, prn meds as ordered see med rec  Continue supplemental oxygen as needed    Acute exacerbation of chronic HFpEF Hospital course:  Noted worsening BL LE edema.  Likely exacerbated by IV fluids and antibiotic use.  No worsening shortness of breath.  Echocardiogram showing findings of grade 1 diastolic dysfunction with a EF of 60 to 65% Net IO Since Admission: -15,196.47 mL [05/18/24 1227]  Plan:  Continue IV Lasix  twice daily Continue to monitor input and output closely    History of CVA Continue statin therapy   Peripheral neuropathy Continue Neurontin    BPH Continue Flomax .   Noncompliance with medical regimen History of leaving AMA.  [facility].  States he completed chemo/radiation.      No concerns based on BMI: Body mass index is 22.87 kg/m.SABRA Significantly low or high BMI is associated with higher medical risk. May be inaccurate given fluid overload  Underweight - under 18   overweight - 25 to 29 obese - 30 or more Class 1 obesity: BMI of 30.0 to 34 Class 2 obesity: BMI of 35.0 to 39 Class 3 obesity: BMI of 40.0 to 49 Super Morbid Obesity: BMI 50-59 Super-super Morbid Obesity: BMI 60+ Healthy nutrition and physical activity advised as adjunct to other disease management and risk reduction treatments    DVT prophylaxis: lovenox  IV fluids: no continuous IV fluids  Nutrition: cardiac diet  Central lines / other devices: none  Code Status: FULL CODE ACP documentation reviewed:  none on file in [facility]            Discharge Instructions  Allergies as of 05/18/2024       Reactions   Trospium    Other Reaction(s): Blurring of visual image        Medication List     STOP taking these medications    Aerochamber Plus Device   albuterol  108 (90 Base) MCG/ACT inhaler Commonly known as: Proventil  HFA   aspirin  EC 81 MG tablet   azelastine 0.1 % nasal spray Commonly known as: ASTELIN   brompheniramine-pseudoephedrine-DM 30-2-10 MG/5ML syrup   budesonide -formoterol  80-4.5 MCG/ACT inhaler Commonly known as: Symbicort    fluticasone 50 MCG/ACT nasal spray Commonly known as: FLONASE   HYDROPHILIC EX   nicotine polacrilex 2 MG lozenge Commonly known as: COMMIT   predniSONE  10 MG tablet Commonly known as: DELTASONE    prochlorperazine 5 MG tablet Commonly known as: COMPAZINE   terazosin 10 MG capsule Commonly known as: HYTRIN       TAKE these medications    acetaminophen  325 MG tablet Commonly known as: TYLENOL  Take 2 tablets (650 mg total) by mouth every 6 (six) hours as needed for mild pain (pain score 1-3) or fever (or Fever >/= 101). What changed:  when to take this reasons to take this   atorvastatin  40 MG tablet Commonly known as: Lipitor Take 1 tablet (40 mg total) by mouth daily.   Baclofen  5 MG Tabs Take 5 mg by mouth in the morning, at noon, and at bedtime.   benzonatate  100 MG capsule Commonly known as:  TESSALON  Take 100 mg by mouth 3 (three) times daily as needed.   budesonide -glycopyrrolate -formoterol  160-9-4.8 MCG/ACT Aero inhaler Commonly known as: BREZTRI  Inhale 2 puffs into the lungs 2 (two) times daily.   chlorpheniramine-HYDROcodone  10-8 MG/5ML Commonly known as: TUSSIONEX Take 5 mLs by mouth every 12 (twelve) hours as needed for cough.   enoxaparin  40 MG/0.4ML injection Commonly known as: LOVENOX  Inject 0.4 mLs (40 mg total) into the skin daily. Start taking on: May 19, 2024   furosemide  10 MG/ML injection Commonly known as: LASIX  Inject 4 mLs (40 mg total) into the vein 2 (two) times daily.   gabapentin  300 MG capsule Commonly known as: NEURONTIN  Take 600 mg by mouth 3 (three) times daily.   guaiFENesin  100 MG/5ML liquid Commonly known as: ROBITUSSIN Take 5 mLs by mouth 4 (four) times daily as needed for cough or to loosen phlegm. Take 1 teaspoonful by mouth four times a day as needed for cough and congestion   ipratropium-albuterol  0.5-2.5 (3) MG/3ML Soln Commonly known as: DUONEB Inhale 3 mLs into the lungs every 4 (four) hours as needed.  magnesium  hydroxide 400 MG/5ML suspension Commonly known as: MILK OF MAGNESIA Take 30 mLs by mouth daily as needed for mild constipation.   melatonin 3 MG Tabs tablet Take 3-6 mg by mouth at bedtime as needed (sleep). Take one capsule/tablet by mouth at bedtime as needed. Okay to increase to 6mg  (2caps/tabs)   morphine  (PF) 2 MG/ML injection Inject 1 mL (2 mg total) into the vein every 4 (four) hours as needed.   mouth rinse Liqd solution 15 mLs by Mouth Rinse route as needed (for oral care).   ondansetron  4 MG tablet Commonly known as: ZOFRAN  Take 1 tablet (4 mg total) by mouth every 6 (six) hours as needed for nausea. What changed:  medication strength how much to take when to take this reasons to take this   tamsulosin  0.4 MG Caps capsule Commonly known as: FLOMAX  Take 0.4 mg by mouth at bedtime.    traZODone  50 MG tablet Commonly known as: DESYREL  Take 50 mg by mouth at bedtime.          Allergies  Allergen Reactions   Trospium     Other Reaction(s): Blurring of visual image     Subjective: pt reports coughing about the same today, no SOB at rest, no chest pain    Discharge Exam: BP 107/68 (BP Location: Right Arm)   Pulse 84   Temp 98.1 F (36.7 C) (Oral)   Resp 15   Ht 5' 10 (1.778 m)   Wt 72.3 kg   SpO2 100%   BMI 22.87 kg/m  General: Pt is alert, awake, not in acute distress Cardiovascular: RRR, S1/S2 WNL Respiratory: diminished breath sounds bilaterally with scattered wheezing Abdominal: Soft, NT, ND, bowel sounds + Extremities: trace LE edema, no cyanosis     The results of significant diagnostics from this hospitalization (including imaging, microbiology, ancillary and laboratory) are listed below for reference.     Microbiology: Recent Results (from the past 240 hours)  Culture, Respiratory w Gram Stain     Status: None   Collection Time: 05/10/24  3:29 PM   Specimen: Bronchoalveolar Lavage; Respiratory  Result Value Ref Range Status   Specimen Description   Final    BRONCHIAL ALVEOLAR LAVAGE Performed at [facility], [address] [place], [place] [address]    Special Requests RML  Final   Gram Stain   Final    RARE WBC PRESENT, PREDOMINANTLY PMN RARE GRAM POSITIVE COCCI    Culture   Final    Normal respiratory flora-no Staph aureus or Pseudomonas seen Performed at [facility], [address], [place], [place] [address]    Report Status 05/13/2024 FINAL  Final  Acid Fast Smear (AFB)     Status: None   Collection Time: 05/10/24  3:29 PM   Specimen: Bronchoalveolar Lavage; Respiratory  Result Value Ref Range Status   AFB Specimen Processing Concentration  Final   Acid Fast Smear Negative  Final    Comment: (NOTE) Performed At: [facility] [address] [place], [place] [id number] Jennette Shorter MD  Ey:[phone]    Source (AFB) BRONCHIAL ALVEOLAR LAVAGE  Final    Comment: Performed at [facility], [address] [place], [place] [address]  Fungus Culture With Stain     Status: None (Preliminary result)   Collection Time: 05/10/24  3:29 PM   Specimen: Bronchial Alveolar Lavage; Respiratory  Result Value Ref Range Status   Fungus Stain Final report  Final    Comment: (NOTE)  Performed At: [facility] [address] [place], [place] [id number] Jennette Shorter MD Ey:[id number]    Fungus (Mycology) Culture PENDING  Incomplete   Fungal Source BRONCHIAL ALVEOLAR LAVAGE  Final    Comment: Performed at [facility], [address] [place], [place] [address]  Fungus Culture Result     Status: None   Collection Time: 05/10/24  3:29 PM  Result Value Ref Range Status   Result 1 Comment  Final    Comment: (NOTE) KOH/Calcofluor preparation:  no fungus observed. Performed At: [facility] [address] [place], [place] [id number] Jennette Shorter MD Ey:[id number]      Labs: BNP (last 3 results) Recent Labs    05/09/24 1707  BNP 78.6   Basic Metabolic Panel: Recent Labs  Lab 05/13/24 0249 05/14/24 0524 05/16/24 0535 05/17/24 0521 05/18/24 0357  NA 135 133* 133* 135 132*  K 4.3 4.0 3.5 3.6 3.7  CL 98 95* 98 100 97*  CO2 28 26 26 28 28   GLUCOSE 82 112* 150* 125* 119*  BUN 27* 29* 26* 27* 22  CREATININE 0.84 1.00 1.15 1.16 1.05  CALCIUM  8.2* 8.4* 8.1* 8.1* 7.9*   Liver Function Tests: No results for input(s): AST, ALT, ALKPHOS, BILITOT, PROT, ALBUMIN in the last 168 hours. No results for input(s): LIPASE, AMYLASE in the last 168 hours. No results for input(s): AMMONIA in the last 168 hours. CBC: Recent Labs  Lab 05/13/24 0249 05/14/24 0524 05/16/24 0535 05/17/24 0521 05/18/24 0357  WBC 12.0* 11.5* 8.6 9.7 10.6*  NEUTROABS 9.8* 9.6* 7.2 7.9* 8.6*  HGB 10.1* 10.3* 9.4* 9.2* 9.5*  HCT 32.9* 33.6* 30.5* 29.1*  30.3*  MCV 82.7 82.4 81.6 82.0 82.8  PLT 224 212 185 196 213   Cardiac Enzymes: No results for input(s): CKTOTAL, CKMB, CKMBINDEX, TROPONINI in the last 168 hours. BNP: Invalid input(s): POCBNP CBG: No results for input(s): GLUCAP in the last 168 hours. D-Dimer No results for input(s): DDIMER in the last 72 hours. Hgb A1c No results for input(s): HGBA1C in the last 72 hours. Lipid Profile No results for input(s): CHOL, HDL, LDLCALC, TRIG, CHOLHDL, LDLDIRECT in the last 72 hours. Thyroid  function studies No results for input(s): TSH, T4TOTAL, T3FREE, THYROIDAB in the last 72 hours.  Invalid input(s): FREET3 Anemia work up No results for input(s): VITAMINB12, FOLATE, FERRITIN, TIBC, IRON, RETICCTPCT in the last 72 hours. Urinalysis    Component Value Date/Time   COLORURINE YELLOW (A) 03/17/2022 1328   APPEARANCEUR CLOUDY (A) 03/17/2022 1328   APPEARANCEUR Clear 09/21/2014 1319   LABSPEC 1.021 03/17/2022 1328   LABSPEC 1.021 09/21/2014 1319   PHURINE 5.0 03/17/2022 1328   GLUCOSEU NEGATIVE 03/17/2022 1328   GLUCOSEU Negative 09/21/2014 1319   HGBUR LARGE (A) 03/17/2022 1328   BILIRUBINUR NEGATIVE 03/17/2022 1328   BILIRUBINUR Negative 09/21/2014 1319   KETONESUR NEGATIVE 03/17/2022 1328   PROTEINUR 100 (A) 03/17/2022 1328   NITRITE NEGATIVE 03/17/2022 1328   LEUKOCYTESUR LARGE (A) 03/17/2022 1328   LEUKOCYTESUR Negative 09/21/2014 1319   Sepsis Labs Recent Labs  Lab 05/14/24 0524 05/16/24 0535 05/17/24 0521 05/18/24 0357  WBC 11.5* 8.6 9.7 10.6*   Microbiology Recent Results (from the past 240 hours)  Culture, Respiratory w Gram Stain     Status: None   Collection Time: 05/10/24  3:29 PM   Specimen: Bronchoalveolar Lavage; Respiratory  Result Value Ref Range Status   Specimen Description   Final    BRONCHIAL ALVEOLAR LAVAGE Performed at [facility],  [address] [place], [place] [address]     Special Requests RML  Final   Gram Stain   Final    RARE WBC PRESENT, PREDOMINANTLY PMN RARE GRAM POSITIVE COCCI    Culture   Final    Normal respiratory flora-no Staph aureus or Pseudomonas seen Performed at [facility], [address], [place], [place] [address]    Report Status 05/13/2024 FINAL  Final  Acid Fast Smear (AFB)     Status: None   Collection Time: 05/10/24  3:29 PM   Specimen: Bronchoalveolar Lavage; Respiratory  Result Value Ref Range Status   AFB Specimen Processing Concentration  Final   Acid Fast Smear Negative  Final    Comment: (NOTE) Performed At: [facility] [address] [place], [place] [id number] Jennette Shorter MD Ey:[id number]    Source (AFB) BRONCHIAL ALVEOLAR LAVAGE  Final    Comment: Performed at [facility], [address] [place], [place] [address]  Fungus Culture With Stain     Status: None (Preliminary result)   Collection Time: 05/10/24  3:29 PM   Specimen: Bronchial Alveolar Lavage; Respiratory  Result Value Ref Range Status   Fungus Stain Final report  Final    Comment: (NOTE) Performed At: [facility] [address] [place], [place] [id number] Jennette Shorter MD Ey:[phone]    Fungus (Mycology) Culture PENDING  Incomplete   Fungal Source BRONCHIAL ALVEOLAR LAVAGE  Final    Comment: Performed at [facility], [address] [place], [place] [address]  Fungus Culture Result     Status: None   Collection Time: 05/10/24  3:29 PM  Result Value Ref Range Status   Result 1 Comment  Final    Comment: (NOTE) KOH/Calcofluor preparation:  no fungus observed. Performed At: [facility] [address] [place], [place] [id number] Jennette Shorter MD Ey:[phone]    Imaging DG Chest Port 1 View Result Date: 05/10/2024 CLINICAL DATA:  Status post bronchoscopy skip chest x-ray 05/05/2024. EXAM: PORTABLE CHEST 1 VIEW COMPARISON:  CT of the chest 05/08/2024.  Chest x-ray 05/05/2024. FINDINGS:  Complete opacification of the right mid and upper lung with cavitary lesion again seen. The there are increasing diffuse interstitial opacities bilaterally. Costophrenic angles are clear. No pneumothorax visualized. Cardiomediastinal silhouette is within normal limits. The osseous structures are stable. IMPRESSION: 1. Complete opacification of the right mid and upper lung with cavitary lesion again seen. 2. Increasing diffuse interstitial opacities bilaterally worrisome for edema. Electronically Signed   By: Greig Pique M.D.   On: 05/10/2024 16:59   ECHOCARDIOGRAM COMPLETE Result Date: 05/10/2024    ECHOCARDIOGRAM REPORT   Patient Name:   SOMNANG MAHAN Date of Exam: 05/10/2024 Medical Rec #:  [id number]         Height:       70.0 in Accession #:    [id number]        Weight:       159.4 lb Date of Birth:  09/11/1946          BSA:          1.895 m Patient Age:    77 years          BP:           142/90 mmHg Patient Gender: M                 HR:           81 bpm. Exam Location:  [facility]  Procedure: 2D Echo, Cardiac Doppler, Color Doppler and Strain Analysis (Both            Spectral and Color Flow Doppler were utilized during procedure). Indications:     Congestive heart failure I50.9  History:         Patient has prior history of Echocardiogram examinations, most                  recent 10/24/2020. Risk Factors:Hypertension. Emphysema lung.  Sonographer:     Christopher Furnace Referring Phys:  [id number] DARRIN BARN Diagnosing Phys: Deatrice Cage MD  Sonographer Comments: Global longitudinal strain was attempted. IMPRESSIONS  1. Left ventricular ejection fraction, by estimation, is 60 to 65%. The left ventricle has normal function. The left ventricle has no regional wall motion abnormalities. There is moderate left ventricular hypertrophy. Left ventricular diastolic parameters are consistent with Grade I diastolic dysfunction (impaired relaxation).  2. Right ventricular systolic function is normal. The right ventricular  size is normal. Tricuspid regurgitation signal is inadequate for assessing PA pressure.  3. Left atrial size was mildly dilated.  4. The mitral valve is normal in structure. No evidence of mitral valve regurgitation. No evidence of mitral stenosis.  5. The aortic valve is tricuspid. Aortic valve regurgitation is not visualized. Aortic valve sclerosis/calcification is present, without any evidence of aortic stenosis. FINDINGS  Left Ventricle: Left ventricular ejection fraction, by estimation, is 60 to 65%. The left ventricle has normal function. The left ventricle has no regional wall motion abnormalities. Strain was performed and the global longitudinal strain is indeterminate. Global longitudinal strain performed but not reported based on interpreter judgement due to suboptimal tracking. The left ventricular internal cavity size was normal in size. There is moderate left ventricular hypertrophy. Left ventricular  diastolic parameters are consistent with Grade I diastolic dysfunction (impaired relaxation). Right Ventricle: The right ventricular size is normal. No increase in right ventricular wall thickness. Right ventricular systolic function is normal. Tricuspid regurgitation signal is inadequate for assessing PA pressure. Left Atrium: Left atrial size was mildly dilated. Right Atrium: Right atrial size was normal in size. Pericardium: There is no evidence of pericardial effusion. Mitral Valve: The mitral valve is normal in structure. No evidence of mitral valve regurgitation. No evidence of mitral valve stenosis. MV peak gradient, 4.4 mmHg. The mean mitral valve gradient is 2.0 mmHg. Tricuspid Valve: The tricuspid valve is normal in structure. Tricuspid valve regurgitation is not demonstrated. No evidence of tricuspid stenosis. Aortic Valve: The aortic valve is tricuspid. Aortic valve regurgitation is not visualized. Aortic valve sclerosis/calcification is present, without any evidence of aortic stenosis. Aortic  valve mean gradient measures 3.5 mmHg. Aortic valve peak gradient measures 6.1 mmHg. Aortic valve area, by VTI measures 4.04 cm. Pulmonic Valve: The pulmonic valve was normal in structure. Pulmonic valve regurgitation is mild. No evidence of pulmonic stenosis. Aorta: The aortic root is normal in size and structure. Venous: The inferior vena cava was not well visualized. IAS/Shunts: No atrial level shunt detected by color flow Doppler.  LEFT VENTRICLE PLAX 2D LVIDd:         3.70 cm   Diastology LVIDs:         2.50 cm   LV e' medial:    7.18 cm/s LV PW:         1.40 cm   LV E/e' medial:  9.4 LV IVS:        1.40 cm   LV e' lateral:   9.25 cm/s LVOT diam:  2.30 cm   LV E/e' lateral: 7.3 LV SV:         76 LV SV Index:   40 LVOT Area:     4.15 cm  RIGHT VENTRICLE RV Basal diam:  2.40 cm RV Mid diam:    2.60 cm RV S prime:     16.00 cm/s TAPSE (M-mode): 2.1 cm LEFT ATRIUM             Index        RIGHT ATRIUM          Index LA diam:        4.80 cm 2.53 cm/m   RA Area:     6.42 cm LA Vol (A2C):   31.2 ml 16.46 ml/m  RA Volume:   9.67 ml  5.10 ml/m LA Vol (A4C):   26.3 ml 13.88 ml/m LA Biplane Vol: 31.1 ml 16.41 ml/m  AORTIC VALVE AV Area (Vmax):    3.63 cm AV Area (Vmean):   3.37 cm AV Area (VTI):     4.04 cm AV Vmax:           123.50 cm/s AV Vmean:          82.950 cm/s AV VTI:            0.187 m AV Peak Grad:      6.1 mmHg AV Mean Grad:      3.5 mmHg LVOT Vmax:         108.00 cm/s LVOT Vmean:        67.300 cm/s LVOT VTI:          0.182 m LVOT/AV VTI ratio: 0.97  AORTA Ao Root diam: 3.20 cm MITRAL VALVE                TRICUSPID VALVE MV Area (PHT): 2.91 cm     TR Peak grad:   10.8 mmHg MV Area VTI:   3.11 cm     TR Vmax:        164.00 cm/s MV Peak grad:  4.4 mmHg MV Mean grad:  2.0 mmHg     SHUNTS MV Vmax:       1.05 m/s     Systemic VTI:  0.18 m MV Vmean:      64.4 cm/s    Systemic Diam: 2.30 cm MV Decel Time: 261 msec MV E velocity: 67.60 cm/s MV A velocity: 106.00 cm/s MV E/A ratio:  0.64 Deatrice Cage  MD Electronically signed by Deatrice Cage MD Signature Date/Time: 05/10/2024/3:00:56 PM    Final       Time coordinating discharge: over 30 minutes  SIGNED:  Marialice Newkirk DO [facility]

## 2024-05-18 NOTE — Hospital Course (Addendum)
 Hospital course / significant events:   78 y.o. male with medical history significant for emphysema, cocaine abuse, GERD, hepatitis C, hypertension, prostate cancer, lung cancer,    PTSD and CVA, who presented to the emergency room with acute onset of worsening dyspnea with associated cough productive of thick whitish sputum as well as wheezing over the last week with no fever or chills.  Found to have exacerbation of his postobstructive pneumonia right upper lobe.  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  The rest of hospital course as outlined in assessment/plan   Has been approved to go to the [place] as of 05/18/24 but we have been waiting on bed assignment into 05/20/24      Consultants:  Pulmonology Oncology Palliative care   Procedures/Surgeries: 07/28: bronchoscopy       ASSESSMENT & PLAN:   Acute on chronic postobstructive cavitary pneumonia in the setting of squamous cell carcinoma of the right lung Acute hypoxic respiratory failure secondary to above, POA RUL lung adenocarcinoma Multiple hospitalizations for pneumonia.   Is followed closely at the [place]. Worsening cough productive sputum on presentation.   Was supposed to receive bronch 6/16 however was canceled due to ongoing cocaine use.   Hospital course:  Repeat CT imaging 7/27 noting progression and interval enlargement of cavitation. Pulmonology consulted. Pt underwent bronchoscopy with EBUS on 05/10/2024.  Pathology results came back showing squamous cell carcinoma of the right upper lobe that is a potential explanation of the cavitary lesion MRSA swab negative so discontinued vancomycin . Has completed course of Unasyn  Patient and family have requested for transfer to the [place] more so in case he needs hospice services to be able to obtain that through the [facility]. Plan:  [facility]/18/24 12:25 PM they can take him to medicine service (spoke w/ Dr Jonette Fate,  resident physician)  Have been awaiting bed availability as of 05/20/24     Acute COPD exacerbation Hospital course:  Exacerbated by above pneumonia Has completed corticosteroid therapy Plan:  Continue bronchodilators, O2, prn meds as ordered see med rec  Continue supplemental oxygen as needed    Acute exacerbation of chronic HFpEF Hospital course:  Noted worsening BL LE edema.  Likely exacerbated by IV fluids and antibiotic use.  No worsening shortness of breath.  Echocardiogram showing findings of grade 1 diastolic dysfunction with a EF of 60 to 65% Net IO Since Admission: -15,196.47 mL [05/18/24 1227]  Plan:  Continue IV Lasix  twice daily Continue to monitor input and output closely    History of CVA Continue statin therapy   Peripheral neuropathy Continue Neurontin    BPH Continue Flomax .   Noncompliance with medical regimen History of leaving AMA.  [facility].  States he completed chemo/radiation.      No concerns based on BMI: Body mass index is 22.87 kg/m.SABRA Significantly low or high BMI is associated with higher medical risk. May be inaccurate given fluid overload  Underweight - under 18  overweight - 25 to 29 obese - 30 or more Class 1 obesity: BMI of 30.0 to 34 Class 2 obesity: BMI of 35.0 to 39 Class 3 obesity: BMI of 40.0 to 49 Super Morbid Obesity: BMI 50-59 Super-super Morbid Obesity: BMI 60+ Healthy nutrition and physical activity advised as adjunct to other disease management and risk reduction treatments    DVT prophylaxis: lovenox  IV fluids: no continuous IV fluids  Nutrition: cardiac diet  Central  lines / other devices: none  Code Status: FULL CODE ACP documentation reviewed:  none on file in [facility]

## 2024-05-19 DIAGNOSIS — J984 Other disorders of lung: Secondary | ICD-10-CM | POA: Diagnosis not present

## 2024-05-19 DIAGNOSIS — J189 Pneumonia, unspecified organism: Secondary | ICD-10-CM | POA: Diagnosis not present

## 2024-05-19 DIAGNOSIS — Z7189 Other specified counseling: Secondary | ICD-10-CM | POA: Diagnosis not present

## 2024-05-19 NOTE — Progress Notes (Signed)
 PROGRESS NOTE    Timothy Matthews   FMW:[id number] DOB: 09-Feb-1946  DOA: 05/05/2024 Date of Service: 05/19/24 which is hospital day 14  PCP: Dorise Sonny Bradley, MD    Hospital course / significant events:   78 y.o. male with medical history significant for emphysema, cocaine abuse, GERD, hepatitis C, hypertension, prostate cancer, lung cancer,    PTSD and CVA, who presented to the emergency room with acute onset of worsening dyspnea with associated cough productive of thick whitish sputum as well as wheezing over the last week with no fever or chills.  Found to have exacerbation of his postobstructive pneumonia right upper lobe.  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  The rest of hospital course as outlined in assessment/plan   Has been approved to go to the [place] as of 05/18/24 but we have been waiting on bed assignment into 05/19/24      Consultants:  Pulmonology Oncology Palliative care   Procedures/Surgeries: 07/28: bronchoscopy       ASSESSMENT & PLAN:   Acute on chronic postobstructive cavitary pneumonia in the setting of squamous cell carcinoma of the right lung Acute hypoxic respiratory failure secondary to above, POA RUL lung adenocarcinoma Multiple hospitalizations for pneumonia.   Is followed closely at the [place]. Worsening cough productive sputum on presentation.   Was supposed to receive bronch 6/16 however was canceled due to ongoing cocaine use.   Hospital course:  Repeat CT imaging 7/27 noting progression and interval enlargement of cavitation. Pulmonology consulted. Pt underwent bronchoscopy with EBUS on 05/10/2024.  Pathology results came back showing squamous cell carcinoma of the right upper lobe that is a potential explanation of the cavitary lesion MRSA swab negative so discontinued vancomycin . Has completed course of Unasyn  Patient and family have requested for transfer to the [place] more so in case he needs hospice  services to be able to obtain that through the [place]. Plan:  [facility] - confirmed 05/18/24 12:25 PM they can take him to medicine service (spoke w/ Dr Jonette Fate, resident physician)     Acute COPD exacerbation Hospital course:  Exacerbated by above pneumonia Has completed corticosteroid therapy Plan:  Continue bronchodilators, O2, prn meds as ordered see med rec  Continue supplemental oxygen as needed    Acute exacerbation of chronic HFpEF Hospital course:  Noted worsening BL LE edema.  Likely exacerbated by IV fluids and antibiotic use.  No worsening shortness of breath.  Echocardiogram showing findings of grade 1 diastolic dysfunction with a EF of 60 to 65% Net IO Since Admission: -15,196.47 mL [05/18/24 1227]  Plan:  Continue IV Lasix  twice daily Continue to monitor input and output closely    History of CVA Continue statin therapy   Peripheral neuropathy Continue Neurontin    BPH Continue Flomax .   Noncompliance with medical regimen History of leaving AMA.  [facility].  States he completed chemo/radiation.      No concerns based on BMI: Body mass index is 22.87 kg/m.SABRA Significantly low or high BMI is associated with higher medical risk. May be inaccurate given fluid overload  Underweight - under 18  overweight - 25 to 29 obese - 30 or more Class 1 obesity: BMI of 30.0 to 34 Class 2 obesity: BMI of 35.0 to 39 Class 3 obesity: BMI of 40.0 to 49 Super Morbid Obesity: BMI 50-59 Super-super Morbid Obesity: BMI 60+ Healthy nutrition and physical activity advised as adjunct to  other disease management and risk reduction treatments    DVT prophylaxis: lovenox  IV fluids: no continuous IV fluids  Nutrition: cardiac diet  Central lines / other devices: none  Code Status: FULL CODE ACP documentation reviewed:  none on file in [facility]             Subjective / Brief ROS:  Patient reports feeling fine  today, no worsening cough or SOB Denies CP/SOB.  Pain controlled.  Denies new weakness.  Tolerating diet.    Family Communication: support person at bedside on rounds     Objective Findings:  Vitals:   05/18/24 2200 05/19/24 0552 05/19/24 0745 05/19/24 1649  BP:  109/84 110/67 121/74  Pulse:  93 96 92  Resp:   16 16  Temp: 98.5 F (36.9 C) 99.3 F (37.4 C) 99.8 F (37.7 C) 98.3 F (36.8 C)  TempSrc: Oral Oral Oral Oral  SpO2:  95% 95% 97%  Weight:      Height:        Intake/Output Summary (Last 24 hours) at 05/19/2024 1716 Last data filed at 05/19/2024 1300 Gross per 24 hour  Intake 480 ml  Output 450 ml  Net 30 ml   Filed Weights   05/05/24 2035  Weight: 72.3 kg    Examination:  Physical Exam Constitutional:      General: He is not in acute distress. Cardiovascular:     Rate and Rhythm: Normal rate and regular rhythm.  Pulmonary:     Breath sounds: Decreased breath sounds present.  Musculoskeletal:     Right lower leg: No edema.     Left lower leg: No edema.  Neurological:     General: No focal deficit present.     Mental Status: He is alert and oriented to person, place, and time.  Psychiatric:        Mood and Affect: Mood normal.        Behavior: Behavior normal.          Scheduled Medications:   atorvastatin   40 mg Oral Daily   baclofen   5 mg Oral BID   benzonatate   100 mg Oral BID   budesonide -glycopyrrolate -formoterol   2 puff Inhalation BID   enoxaparin  (LOVENOX ) injection  40 mg Subcutaneous Q24H   furosemide   40 mg Intravenous BID   gabapentin   600 mg Oral TID   guaiFENesin   600 mg Oral BID   tamsulosin   0.4 mg Oral QHS    Continuous Infusions:   PRN Medications:  acetaminophen  **OR** acetaminophen , chlorpheniramine-HYDROcodone , ipratropium-albuterol , magnesium  hydroxide, melatonin, morphine  injection, ondansetron  **OR** ondansetron  (ZOFRAN ) IV, mouth rinse, traZODone   Antimicrobials from admission:  Anti-infectives (From  admission, onward)    Start     Dose/Rate Route Frequency Ordered Stop   05/07/24 0000  vancomycin  (VANCOREADY) IVPB 1750 mg/350 mL  Status:  Discontinued        1,750 mg 175 mL/hr over 120 Minutes Intravenous Every 24 hours 05/06/24 0501 05/06/24 0831   05/06/24 2230  azithromycin  (ZITHROMAX ) 500 mg in sodium chloride  0.9 % 250 mL IVPB        500 mg 250 mL/hr over 60 Minutes Intravenous Every 24 hours 05/05/24 2258 05/10/24 1905   05/06/24 2200  cefTRIAXone  (ROCEPHIN ) 2 g in sodium chloride  0.9 % 100 mL IVPB  Status:  Discontinued        2 g 200 mL/hr over 30 Minutes Intravenous Every 24 hours 05/05/24 2258 05/06/24 1234   05/06/24 1400  vancomycin  (VANCOCIN ) IVPB 1000 mg/200 mL  premix  Status:  Discontinued        1,000 mg 200 mL/hr over 60 Minutes Intravenous Every 12 hours 05/06/24 0831 05/07/24 1100   05/06/24 1400  Ampicillin -Sulbactam (UNASYN ) 3 g in sodium chloride  0.9 % 100 mL IVPB  Status:  Discontinued        3 g 200 mL/hr over 30 Minutes Intravenous Every 6 hours 05/06/24 1234 05/15/24 1716   05/05/24 2230  cefTRIAXone  (ROCEPHIN ) 2 g in sodium chloride  0.9 % 100 mL IVPB        2 g 200 mL/hr over 30 Minutes Intravenous Once 05/05/24 2221 05/06/24 0015   05/05/24 2230  azithromycin  (ZITHROMAX ) 500 mg in sodium chloride  0.9 % 250 mL IVPB        500 mg 250 mL/hr over 60 Minutes Intravenous  Once 05/05/24 2221 05/06/24 0143   05/05/24 2230  vancomycin  (VANCOCIN ) IVPB 1000 mg/200 mL premix  Status:  Discontinued        1,000 mg 200 mL/hr over 60 Minutes Intravenous  Once 05/05/24 2221 05/05/24 2223   05/05/24 2230  vancomycin  (VANCOREADY) IVPB 1750 mg/350 mL        1,750 mg 175 mL/hr over 120 Minutes Intravenous  Once 05/05/24 2223 05/06/24 0435           Data Reviewed:  I have personally reviewed the following...  CBC: Recent Labs  Lab 05/13/24 0249 05/14/24 0524 05/16/24 0535 05/17/24 0521 05/18/24 0357  WBC 12.0* 11.5* 8.6 9.7 10.6*  NEUTROABS 9.8* 9.6* 7.2  7.9* 8.6*  HGB 10.1* 10.3* 9.4* 9.2* 9.5*  HCT 32.9* 33.6* 30.5* 29.1* 30.3*  MCV 82.7 82.4 81.6 82.0 82.8  PLT 224 212 185 196 213   Basic Metabolic Panel: Recent Labs  Lab 05/13/24 0249 05/14/24 0524 05/16/24 0535 05/17/24 0521 05/18/24 0357  NA 135 133* 133* 135 132*  K 4.3 4.0 3.5 3.6 3.7  CL 98 95* 98 100 97*  CO2 28 26 26 28 28   GLUCOSE 82 112* 150* 125* 119*  BUN 27* 29* 26* 27* 22  CREATININE 0.84 1.00 1.15 1.16 1.05  CALCIUM  8.2* 8.4* 8.1* 8.1* 7.9*   GFR: Estimated Creatinine Clearance: 61.2 mL/min (by C-G formula based on SCr of 1.05 mg/dL). Liver Function Tests: No results for input(s): AST, ALT, ALKPHOS, BILITOT, PROT, ALBUMIN in the last 168 hours. No results for input(s): LIPASE, AMYLASE in the last 168 hours. No results for input(s): AMMONIA in the last 168 hours. Coagulation Profile: No results for input(s): INR, PROTIME in the last 168 hours. Cardiac Enzymes: No results for input(s): CKTOTAL, CKMB, CKMBINDEX, TROPONINI in the last 168 hours. BNP (last 3 results) No results for input(s): PROBNP in the last 8760 hours. HbA1C: No results for input(s): HGBA1C in the last 72 hours. CBG: No results for input(s): GLUCAP in the last 168 hours. Lipid Profile: No results for input(s): CHOL, HDL, LDLCALC, TRIG, CHOLHDL, LDLDIRECT in the last 72 hours. Thyroid  Function Tests: No results for input(s): TSH, T4TOTAL, FREET4, T3FREE, THYROIDAB in the last 72 hours. Anemia Panel: No results for input(s): VITAMINB12, FOLATE, FERRITIN, TIBC, IRON, RETICCTPCT in the last 72 hours. Most Recent Urinalysis On File:     Component Value Date/Time   COLORURINE YELLOW (A) 03/17/2022 1328   APPEARANCEUR CLOUDY (A) 03/17/2022 1328   APPEARANCEUR Clear 09/21/2014 1319   LABSPEC 1.021 03/17/2022 1328   LABSPEC 1.021 09/21/2014 1319   PHURINE 5.0 03/17/2022 1328   GLUCOSEU NEGATIVE 03/17/2022 1328    GLUCOSEU Negative 09/21/2014  1319   HGBUR LARGE (A) 03/17/2022 1328   BILIRUBINUR NEGATIVE 03/17/2022 1328   BILIRUBINUR Negative 09/21/2014 1319   KETONESUR NEGATIVE 03/17/2022 1328   PROTEINUR 100 (A) 03/17/2022 1328   NITRITE NEGATIVE 03/17/2022 1328   LEUKOCYTESUR LARGE (A) 03/17/2022 1328   LEUKOCYTESUR Negative 09/21/2014 1319   Sepsis Labs: @LABRCNTIP (procalcitonin:4,lacticidven:4) Microbiology: Recent Results (from the past 240 hours)  Culture, Respiratory w Gram Stain     Status: None   Collection Time: 05/10/24  3:29 PM   Specimen: Bronchoalveolar Lavage; Respiratory  Result Value Ref Range Status   Specimen Description   Final    BRONCHIAL ALVEOLAR LAVAGE Performed at [facility], [address] [place], [place] [address]    Special Requests RML  Final   Gram Stain   Final    RARE WBC PRESENT, PREDOMINANTLY PMN RARE GRAM POSITIVE COCCI    Culture   Final    Normal respiratory flora-no Staph aureus or Pseudomonas seen Performed at [facility], [address] [place], [place] [address]    Report Status 05/13/2024 FINAL  Final  Acid Fast Smear (AFB)     Status: None   Collection Time: 05/10/24  3:29 PM   Specimen: Bronchoalveolar Lavage; Respiratory  Result Value Ref Range Status   AFB Specimen Processing Concentration  Final   Acid Fast Smear Negative  Final    Comment: (NOTE) Performed At: [facility] [address] [place], [place] [id number] Jennette Shorter MD Ey:[id number]    Source (AFB) BRONCHIAL ALVEOLAR LAVAGE  Final    Comment: Performed at [facility], [address] [place], [place] [address]  Fungus Culture With Stain     Status: None (Preliminary result)   Collection Time: 05/10/24  3:29 PM   Specimen: Bronchial Alveolar Lavage; Respiratory  Result Value Ref Range Status   Fungus Stain Final report  Final    Comment: (NOTE) Performed At: [facility] [address] [place], [place]  [id number] Jennette Shorter MD Ey:[id number]    Fungus (Mycology) Culture PENDING  Incomplete   Fungal Source BRONCHIAL ALVEOLAR LAVAGE  Final    Comment: Performed at [facility], [address] [place], [place] [address]  Fungus Culture Result     Status: None   Collection Time: 05/10/24  3:29 PM  Result Value Ref Range Status   Result 1 Comment  Final    Comment: (NOTE) KOH/Calcofluor preparation:  no fungus observed. Performed At: [facility] [address] [place], [place] [id number] Jennette Shorter MD Ey:[phone]       Radiology Studies last 3 days: No results found.       Jettie Mannor, DO [facility] 05/19/2024, 5:16 PM    Dictation software may have been used to generate the above note. Typos may occur and escape review in typed/dictated notes. Please contact Dr Marsa directly for clarity if needed.  Staff may message me via secure chat in Epic  but this may not receive an immediate response,  please page me for urgent matters!  If 7PM-7AM, please contact night coverage [email or web address]

## 2024-05-19 NOTE — Progress Notes (Addendum)
 Daily Progress Note   Patient Name: Timothy Matthews       Date: 05/19/2024 DOB: September 13, 1946  Age: 78 y.o. MRN#: [id number] Attending Physician: Marsa Edelman, DO Primary Care Physician: Dorise Sonny Bradley, MD Admit Date: 05/05/2024  Reason for Consultation/Follow-up: Establishing goals of care  Subjective: Notes reviewed.  Into see patient.  He is currently resting in bed with a woman at bedside who states she is he has adopted daughter.  Patient states he is hopeful to go to the [place].  He states he wants to speak with their providers regarding any care plans moving forward.  He states that if he is unable to transfer to the [place], then when he is ready for discharge from [facility], he will arrange seeing the [facility] regarding plans moving forward.    He states he does not wish to discuss goals of care any further at [facility] and does not wish to speak to oncology at [facility].  Case discussed with attending.  PMT will sign off as goals are set.  Please reconsult if patient wishes to speak with member of PMT.   Length of Stay: 14  Current Medications: Scheduled Meds:   atorvastatin   40 mg Oral Daily   baclofen   5 mg Oral BID   benzonatate   100 mg Oral BID   budesonide -glycopyrrolate -formoterol   2 puff Inhalation BID   enoxaparin  (LOVENOX ) injection  40 mg Subcutaneous Q24H   furosemide   40 mg Intravenous BID   gabapentin   600 mg Oral TID   guaiFENesin   600 mg Oral BID   tamsulosin   0.4 mg Oral QHS    Continuous Infusions:   PRN Meds: acetaminophen  **OR** acetaminophen , chlorpheniramine-HYDROcodone , ipratropium-albuterol , magnesium  hydroxide, melatonin, morphine  injection, ondansetron  **OR** ondansetron  (ZOFRAN ) IV, mouth rinse, traZODone   Physical Exam Pulmonary:     Effort: Pulmonary effort is  normal.  Neurological:     Mental Status: He is alert.             Vital Signs: BP 110/67 (BP Location: Left Arm)   Pulse 96   Temp 99.8 F (37.7 C) (Oral)   Resp 16   Ht 5' 10 (1.778 m)   Wt 72.3 kg   SpO2 95%   BMI 22.87 kg/m  SpO2: SpO2: 95 % O2 Device: O2 Device: Nasal Cannula O2 Flow Rate: O2  Flow Rate (L/min): 3.5 L/min  Intake/output summary:  Intake/Output Summary (Last 24 hours) at 05/19/2024 1257 Last data filed at 05/19/2024 0900 Gross per 24 hour  Intake 480 ml  Output 1150 ml  Net -670 ml   LBM: Last BM Date : 05/12/24 Baseline Weight: Weight: 72.3 kg Most recent weight: Weight: 72.3 kg    Patient Active Problem List   Diagnosis Date Noted   Goals of care, counseling/discussion 05/13/2024   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) 05/13/2024   Postobstructive pneumonia 05/09/2024   Dyslipidemia 05/06/2024   BPH (benign prostatic hyperplasia) 05/06/2024   Peripheral neuropathy 05/06/2024   History of CVA (cerebrovascular accident) 05/06/2024   Essential hypertension 05/06/2024   Cavitary pneumonia 05/05/2024   COPD exacerbation (HCC) 04/20/2024   Necrotizing pneumonia (HCC) 03/28/2024   Cancer of right lung (HCC) 03/25/2024   Community acquired pneumonia 03/25/2024   Cocaine abuse with cocaine-induced mood disorder (HCC) 09/04/2022   Anxiety state 09/02/2022   Medication reaction 09/02/2022   CVA (cerebral vascular accident) (HCC) 10/23/2020   Cocaine abuse (HCC) 10/23/2020   HTN (hypertension) 10/23/2020   Emphysema lung (HCC) 10/23/2020   GERD (gastroesophageal reflux disease) 10/23/2020    Palliative Care Assessment & Plan    Recommendations/Plan: Goals set for full code and full scope during this admission.  Patient does not wish to have any further goals of care conversations during this admission.   PMT will sign off.  Please reconsult if patient wishes to speak with a member of our team.  Code Status:    Code Status  Orders  (From admission, onward)           Start     Ordered   05/05/24 2257  Full code  Continuous       Question:  By:  Answer:  Consent: discussion documented in EHR   05/05/24 2258           Code Status History     Date Active Date Inactive Code Status Order ID Comments User Context   04/20/2024 0243 04/20/2024 2344 Full Code [id number]  Cleatus Delayne GAILS, MD ED   03/25/2024 1825 03/28/2024 2330 Full Code [id number]  Cox, Amy N, DO ED   10/23/2020 1342 10/30/2020 1903 Full Code [id number]  Harlow Ozell BRAVO, MD ED   04/07/2020 1703 04/09/2020 1445 Full Code [id number]  Willo Dunnings, MD ED       Thank you for allowing the Palliative Medicine Team to assist in the care of this patient.  Camelia Lewis, NP  Please contact Palliative Medicine Team phone at [phone] for questions and concerns.

## 2024-05-19 NOTE — Progress Notes (Signed)
 Occupational Therapy Treatment Patient Details Name: Timothy Matthews MRN: [id number] DOB: 09-06-1946 Today's Date: 05/19/2024   History of present illness Pt is a 78 y/o M admitted on 05/05/24 after presenting with c/o worsening dyspnea with cough & sputum production, as well as wheezing. 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 Pt underwent bronchoscopy & pathology results showed squamous cell carcinoma of the RUL. PMH: emphysema, cocaine abuse, GERD, Hep C, HTN, prostate CA, lung CA, PTSD, CVA   OT comments  Pt seen for OT treatment on this date. Upon arrival to room pt resting with friend at bedside, hesitant to participate in OT treatment, with verbal encouragement pt agreeable. Pt requires MODA to come to sitting EOB from supine. Pt required MAXA for scooting his hips to get his bil LE on the floor. Pt required MODA for face washing in sitting postion, pt initially attempted with very minimal effort but ultimately required assistance to complete. Pt unable to tolerate prolong sitting due to reported abdominal pain. Pt often leaned back to help with the pain, unwilling to attempt standing on this date. Pt returned to supine with MODA for LE and trunk management. Pt making fair progress toward goals, will continue to follow POC. Discharge recommendation remains appropriate.        If plan is discharge home, recommend the following:  A little help with bathing/dressing/bathroom;A little help with walking and/or transfers;Direct supervision/assist for medications management   Equipment Recommendations  Other (comment)    Recommendations for Other Services      Precautions / Restrictions Precautions Precautions: Fall Recall of Precautions/Restrictions: Impaired Restrictions Weight Bearing Restrictions Per Provider Order: No       Mobility Bed Mobility Overal bed mobility: Needs Assistance Bed Mobility: Supine to Sit,  Sit to Supine     Supine to sit: MAX assist, HOB elevated, Used rails Sit to supine: Mod assist   General bed mobility comments: MODA for LE and trunk control    Transfers                   General transfer comment: Pt verbalized willingness to attempt mobility but once sitting on the EOB, reported he was in too much pain to proceed     Balance Overall balance assessment: Needs assistance Sitting-balance support: Feet supported Sitting balance-Leahy Scale: Fair Sitting balance - Comments: Leaned back to find comfortable position for abdominal pin, required MODA for support in sitting EOB on this date.                                   ADL either performed or assessed with clinical judgement   ADL Overall ADL's : Needs assistance/impaired     Grooming: Sitting;Moderate assistance Grooming Details (indicate cue type and reason): Sitting on EOB, wants assistance from author to complete                               General ADL Comments: Limited ADL participation during session due to reported abdominal pain     Communication Communication Communication: Impaired Factors Affecting Communication: Reduced clarity of speech   Cognition Arousal: Alert, Lethargic Behavior During Therapy: WFL for tasks assessed/performed Cognition: Cognition impaired       Memory impairment (select all impairments): Short-term memory, Declarative long-term memory Attention impairment (select first level of  impairment): Sustained attention Executive functioning impairment (select all impairments): Reasoning, Problem solving OT - Cognition Comments: Lethargic at start of session, slowly becomes more alert                 Following commands: Impaired Following commands impaired: Follows multi-step commands inconsistently      Cueing   Cueing Techniques: Verbal cues, Gestural cues, Tactile cues  Exercises Exercises: Other exercises Other  Exercises Other Exercises: Edu: Benefits of participation of therapy session    Shoulder Instructions       General Comments Friend Threasa at bedside, pt on 3L/min via [place] throughout session    Pertinent Vitals/ Pain       Pain Assessment Pain Assessment: Faces Faces Pain Scale: Hurts whole lot Pain Location: R side of abdomin (hernia per pt) Pain Descriptors / Indicators: Constant, Discomfort, Grimacing, Moaning Pain Intervention(s): Limited activity within patient's tolerance, Monitored during session, Patient requesting pain meds-RN notified                                                          Frequency  Min 2X/week        Progress Toward Goals  OT Goals(current goals can now be found in the care plan section)  Progress towards OT goals: Progressing toward goals  Acute Rehab OT Goals OT Goal Formulation: With patient Time For Goal Achievement: 05/27/24 Potential to Achieve Goals: Good ADL Goals Pt Will Perform Grooming: with modified independence;sitting;standing Pt Will Perform Lower Body Dressing: with modified independence;sit to/from stand;sitting/lateral leans Pt Will Transfer to Toilet: with modified independence;ambulating Pt Will Perform Toileting - Clothing Manipulation and hygiene: with modified independence;sitting/lateral leans;sit to/from stand  Plan      Co-evaluation                 AM-PAC OT 6 Clicks Daily Activity     Outcome Measure   Help from another person eating meals?: None Help from another person taking care of personal grooming?: None Help from another person toileting, which includes using toliet, bedpan, or urinal?: A Lot Help from another person bathing (including washing, rinsing, drying)?: A Little Help from another person to put on and taking off regular upper body clothing?: A Little Help from another person to put on and taking off regular lower body clothing?: A Lot 6 Click Score: 18     End of Session Equipment Utilized During Treatment: Oxygen  OT Visit Diagnosis: Other abnormalities of gait and mobility (R26.89);Muscle weakness (generalized) (M62.81)   Activity Tolerance Patient limited by pain   Patient Left in bed;with call bell/phone within reach;with bed alarm set;with family/visitor present;with nursing/sitter in room   Nurse Communication Patient requests pain meds        Time: 8962-8947 OT Time Calculation (min): 15 min  Charges: OT General Charges $OT Visit: 1 Visit OT Treatments $Therapeutic Activity: 8-22 mins  Larraine Colas M.S. OTR/L  05/19/24, 11:29 AM

## 2024-05-19 NOTE — Plan of Care (Signed)
  Problem: Clinical Measurements: Goal: Ability to maintain clinical measurements within normal limits will improve Outcome: Progressing Goal: Will remain free from infection Outcome: Progressing Goal: Respiratory complications will improve Outcome: Progressing Goal: Cardiovascular complication will be avoided Outcome: Progressing   Problem: Activity: Goal: Risk for activity intolerance will decrease Outcome: Progressing   Problem: Nutrition: Goal: Adequate nutrition will be maintained Outcome: Progressing   Problem: Coping: Goal: Level of anxiety will decrease Outcome: Progressing   Problem: Safety: Goal: Ability to remain free from injury will improve Outcome: Progressing

## 2024-05-19 NOTE — Plan of Care (Signed)
  Problem: Education: Goal: Knowledge of General Education information will improve Description: Including pain rating scale, medication(s)/side effects and non-pharmacologic comfort measures Outcome: Not Progressing   Problem: Clinical Measurements: Goal: Respiratory complications will improve Outcome: Not Progressing   Problem: Activity: Goal: Risk for activity intolerance will decrease Outcome: Not Progressing

## 2024-05-20 DIAGNOSIS — J984 Other disorders of lung: Secondary | ICD-10-CM | POA: Diagnosis not present

## 2024-05-20 DIAGNOSIS — J189 Pneumonia, unspecified organism: Secondary | ICD-10-CM | POA: Diagnosis not present

## 2024-05-20 NOTE — TOC Transition Note (Signed)
 Transition of Care [facility]) - Discharge Note   Patient Details  Name: NAYAN PROCH MRN: [id number] Date of Birth: 03/06/1946  Transition of Care [facility] CM/SW Contact:  Dalia GORMAN Fuse, RN Phone Number: 05/20/2024, 2:51 PM   Clinical Narrative:     Patient is medically clear to discharge to the [place], [place]. Patient is in agreement with the discharge plan. Patient is going to another hospital. [facility] to transport. No other TOC needs.         Patient Goals and CMS Choice            Discharge Placement                       Discharge Plan and Services Additional resources added to the After Visit Summary for                                       Social Drivers of Health (SDOH) Interventions SDOH Screenings   Food Insecurity: No Food Insecurity (05/06/2024)  Housing: Low Risk  (05/06/2024)  Transportation Needs: Unmet Transportation Needs (05/06/2024)  Utilities: Not At Risk (05/06/2024)  Social Connections: Moderately Integrated (05/06/2024)  Tobacco Use: High Risk (05/05/2024)     Readmission Risk Interventions     No data to display

## 2024-05-20 NOTE — Discharge Summary (Signed)
 Physician Discharge Summary   Patient: Timothy [place] MRN: [id number]  DOB: [birth year]   Admit:     Date of Admission: 05/05/2024 Admitted from: home   Discharge: Date of discharge: 05/20/24 Disposition: [facility]  Condition at discharge: fair  CODE STATUS: FULL CODE     Discharge Physician: Laneta Blunt, DO [facility]     PCP: Dorise Sonny Bradley, MD  Recommendations for Outpatient Follow-up:  Follow up pending clinical progression at the [facility]       Discharge Diagnoses: Principal Problem:   Cavitary pneumonia Active Problems:   COPD exacerbation (HCC)   Dyslipidemia   BPH (benign prostatic hyperplasia)   Peripheral neuropathy   History of CVA (cerebrovascular accident)   Essential hypertension   Postobstructive pneumonia   Goals of care, counseling/discussion   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 male with medical history significant for emphysema, cocaine abuse, GERD, hepatitis C, hypertension, prostate cancer, lung cancer,    PTSD and CVA, who presented to the emergency room with acute onset of worsening dyspnea with associated cough productive of thick whitish sputum as well as wheezing over the last week with no fever or chills.  Found to have exacerbation of his postobstructive pneumonia right upper lobe.  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   Is followed closely at the [place]. Worsening cough productive sputum on presentation.   Was supposed to receive bronch 6/16 however was canceled due to ongoing cocaine use.   Hospital course:  Repeat CT imaging 7/27 noting progression and interval enlargement of cavitation. Pulmonology consulted. Pt underwent bronchoscopy with EBUS on 05/10/2024.  Pathology results came back showing squamous cell carcinoma of the right upper lobe that is a potential explanation of the cavitary lesion MRSA swab negative so discontinued vancomycin . Has completed course of Unasyn  Patient and family have requested for transfer to the [place] more so in case he needs hospice services to be able to obtain that through the [place]. Plan:  [facility] 05/18/24 12:25 PM they can take him to medicine service (spoke w/ Dr Jonette Fate, resident physician)  Have been awaiting bed availability which was finalized on 05/20/24     Acute COPD exacerbation Hospital course:  Exacerbated by above pneumonia Has completed corticosteroid therapy Plan:  Continue bronchodilators, O2, prn meds as ordered see med rec  Continue supplemental oxygen as needed    Acute exacerbation of chronic HFpEF Hospital course:  Noted worsening BL LE edema.  Likely exacerbated by IV fluids and antibiotic use.  No worsening shortness of breath.  Echocardiogram showing findings of grade 1 diastolic dysfunction with a EF of 60 to 65% Net IO Since Admission: -15,196.47 mL [05/18/24 1227]  Plan:  Continue IV Lasix  twice daily Continue to monitor input and output closely  History of CVA Continue statin therapy   Peripheral neuropathy Continue Neurontin    BPH Continue Flomax .   Noncompliance with medical regimen History of leaving AMA.  [facility].  States he completed chemo/radiation.       No concerns based on BMI: Body mass index is 22.87 kg/m.SABRA Significantly low or high BMI is associated with higher medical risk. May be inaccurate given fluid overload  Underweight - under 18  overweight - 25 to 29 obese - 30 or more Class 1 obesity: BMI of 30.0 to 34 Class 2 obesity: BMI of 35.0 to 39 Class 3 obesity: BMI of 40.0 to 49 Super Morbid Obesity: BMI 50-59 Super-super Morbid Obesity: BMI 60+ Healthy nutrition and physical activity advised as adjunct to other disease management and risk reduction treatments    DVT prophylaxis: lovenox  IV fluids: no continuous IV fluids  Nutrition: cardiac diet  Central lines / other devices: none  Code Status: FULL CODE ACP documentation reviewed:  none on file in [facility]            Discharge Instructions  Allergies as of 05/20/2024       Reactions   Trospium    Other Reaction(s): Blurring of visual image        Medication List     STOP taking these medications    Aerochamber Plus Device   albuterol  108 (90 Base) MCG/ACT inhaler Commonly known as: Proventil  HFA   aspirin  EC 81 MG tablet   azelastine 0.1 % nasal spray Commonly known as: ASTELIN   brompheniramine-pseudoephedrine-DM 30-2-10 MG/5ML syrup   budesonide -formoterol  80-4.5 MCG/ACT inhaler Commonly known as: Symbicort    fluticasone 50 MCG/ACT nasal spray Commonly known as: FLONASE   HYDROPHILIC EX   nicotine polacrilex 2 MG lozenge Commonly known as: COMMIT   predniSONE  10 MG tablet Commonly known as: DELTASONE    prochlorperazine 5 MG tablet Commonly known as: COMPAZINE   terazosin 10 MG capsule Commonly known as: HYTRIN       TAKE these medications    acetaminophen  325 MG tablet Commonly known as: TYLENOL  Take 2 tablets (650 mg total) by mouth every 6 (six) hours as needed for mild pain (pain score 1-3) or fever (or Fever >/= 101). What changed:  when to take this reasons to take this   atorvastatin  40 MG  tablet Commonly known as: Lipitor Take 1 tablet (40 mg total) by mouth daily.   Baclofen  5 MG Tabs Take 5 mg by mouth in the morning, at noon, and at bedtime.   benzonatate  100 MG capsule Commonly known as: TESSALON  Take 100 mg by mouth 3 (three) times daily as needed.   budesonide -glycopyrrolate -formoterol  160-9-4.8 MCG/ACT Aero inhaler Commonly known as: BREZTRI  Inhale 2 puffs into the lungs 2 (two) times daily.   chlorpheniramine-HYDROcodone  10-8 MG/5ML Commonly known as: TUSSIONEX Take 5 mLs by mouth every 12 (twelve) hours as needed for cough.   enoxaparin  40 MG/0.4ML injection Commonly known as: LOVENOX  Inject 0.4 mLs (40 mg total) into the skin daily.   furosemide  10 MG/ML injection Commonly known as: LASIX  Inject 4 mLs (40 mg total) into the vein 2 (two) times daily.   gabapentin  300 MG capsule Commonly known as: NEURONTIN  Take 600 mg by mouth 3 (three) times daily.   guaiFENesin  100 MG/5ML liquid Commonly known as: ROBITUSSIN Take 5 mLs by mouth 4 (four) times daily as needed for cough or to loosen phlegm. Take 1 teaspoonful by mouth four times a  day as needed for cough and congestion   ipratropium-albuterol  0.5-2.5 (3) MG/3ML Soln Commonly known as: DUONEB Inhale 3 mLs into the lungs every 4 (four) hours as needed.   magnesium  hydroxide 400 MG/5ML suspension Commonly known as: MILK OF MAGNESIA Take 30 mLs by mouth daily as needed for mild constipation.   melatonin 3 MG Tabs tablet Take 3-6 mg by mouth at bedtime as needed (sleep). Take one capsule/tablet by mouth at bedtime as needed. Okay to increase to 6mg  (2caps/tabs)   morphine  (PF) 2 MG/ML injection Inject 1 mL (2 mg total) into the vein every 4 (four) hours as needed.   mouth rinse Liqd solution 15 mLs by Mouth Rinse route as needed (for oral care).   ondansetron  4 MG tablet Commonly known as: ZOFRAN  Take 1 tablet (4 mg total) by mouth every 6 (six) hours as needed for nausea. What changed:   medication strength how much to take when to take this reasons to take this   tamsulosin  0.4 MG Caps capsule Commonly known as: FLOMAX  Take 0.4 mg by mouth at bedtime.   traZODone  50 MG tablet Commonly known as: DESYREL  Take 50 mg by mouth at bedtime.          Allergies  Allergen Reactions   Trospium     Other Reaction(s): Blurring of visual image     Subjective: pt reports coughing about the same today, no SOB at rest, no chest pain    Discharge Exam: BP 103/65 (BP Location: Left Arm)   Pulse 83   Temp 98.2 F (36.8 C) (Oral)   Resp 18   Ht 5' 10 (1.778 m)   Wt 72.3 kg   SpO2 94%   BMI 22.87 kg/m  General: Pt is alert, awake, not in acute distress Cardiovascular: RRR, S1/S2 WNL Respiratory: diminished breath sounds bilaterally with scattered wheezing Abdominal: Soft, NT, ND, bowel sounds + Extremities: trace LE edema, no cyanosis     The results of significant diagnostics from this hospitalization (including imaging, microbiology, ancillary and laboratory) are listed below for reference.     Microbiology: Recent Results (from the past 240 hours)  Culture, Respiratory w Gram Stain     Status: None   Collection Time: 05/10/24  3:29 PM   Specimen: Bronchoalveolar Lavage; Respiratory  Result Value Ref Range Status   Specimen Description   Final    BRONCHIAL ALVEOLAR LAVAGE Performed at [facility], [address] [place], [place] [address]    Special Requests RML  Final   Gram Stain   Final    RARE WBC PRESENT, PREDOMINANTLY PMN RARE GRAM POSITIVE COCCI    Culture   Final    Normal respiratory flora-no Staph aureus or Pseudomonas seen Performed at [facility], [address], [place], [place] [address]    Report Status 05/13/2024 FINAL  Final  Acid Fast Smear (AFB)     Status: None   Collection Time: 05/10/24  3:29 PM   Specimen: Bronchoalveolar Lavage; Respiratory  Result Value Ref Range Status   AFB Specimen Processing  Concentration  Final   Acid Fast Smear Negative  Final    Comment: (NOTE) Performed At: [facility] [address] [place], [place] [id number] Jennette Shorter MD Ey:[id number]    Source (AFB) BRONCHIAL ALVEOLAR LAVAGE  Final    Comment: Performed at [facility], [address], [place], [place] [address]  Fungus Culture With Stain     Status: Abnormal   Collection Time:  05/10/24  3:29 PM   Specimen: Bronchial Alveolar Lavage; Respiratory  Result Value Ref Range Status   Fungus Stain Final report  Final   Fungus (Mycology) Culture Preliminary report (A)  Final    Comment: (NOTE) Performed At: [facility] [address] [place], [place] [id number] Jennette Shorter MD Ey:[id number]    Fungal Source BRONCHIAL ALVEOLAR LAVAGE  Final    Comment: Performed at [facility], [address] [place], [place] [address]  Fungus Culture Result     Status: None   Collection Time: 05/10/24  3:29 PM  Result Value Ref Range Status   Result 1 Comment  Final    Comment: (NOTE) KOH/Calcofluor preparation:  no fungus observed. Performed At: [facility] [address] [place], [place] [id number] Jennette Shorter MD Ey:[id number]   Fungal organism reflex     Status: Abnormal   Collection Time: 05/10/24  3:29 PM  Result Value Ref Range Status   Fungal result 1 Candida albicans (A)  Final    Comment: (NOTE) Scant growth Performed At: [facility] [address] [place], [place] [id number] Jennette Shorter MD Ey:[id number]      Labs: BNP (last 3 results) Recent Labs    05/09/24 1707  BNP 78.6   Basic Metabolic Panel: Recent Labs  Lab 05/14/24 0524 05/16/24 0535 05/17/24 0521 05/18/24 0357  NA 133* 133* 135 132*  K 4.0 3.5 3.6 3.7  CL 95* 98 100 97*  CO2 26 26 28 28   GLUCOSE 112* 150* 125* 119*  BUN 29* 26* 27* 22  CREATININE 1.00 1.15 1.16 1.05  CALCIUM  8.4* 8.1* 8.1* 7.9*   Liver Function Tests: No results for input(s):  AST, ALT, ALKPHOS, BILITOT, PROT, ALBUMIN in the last 168 hours. No results for input(s): LIPASE, AMYLASE in the last 168 hours. No results for input(s): AMMONIA in the last 168 hours. CBC: Recent Labs  Lab 05/14/24 0524 05/16/24 0535 05/17/24 0521 05/18/24 0357  WBC 11.5* 8.6 9.7 10.6*  NEUTROABS 9.6* 7.2 7.9* 8.6*  HGB 10.3* 9.4* 9.2* 9.5*  HCT 33.6* 30.5* 29.1* 30.3*  MCV 82.4 81.6 82.0 82.8  PLT 212 185 196 213   Cardiac Enzymes: No results for input(s): CKTOTAL, CKMB, CKMBINDEX, TROPONINI in the last 168 hours. BNP: Invalid input(s): POCBNP CBG: No results for input(s): GLUCAP in the last 168 hours. D-Dimer No results for input(s): DDIMER in the last 72 hours. Hgb A1c No results for input(s): HGBA1C in the last 72 hours. Lipid Profile No results for input(s): CHOL, HDL, LDLCALC, TRIG, CHOLHDL, LDLDIRECT in the last 72 hours. Thyroid  function studies No results for input(s): TSH, T4TOTAL, T3FREE, THYROIDAB in the last 72 hours.  Invalid input(s): FREET3 Anemia work up No results for input(s): VITAMINB12, FOLATE, FERRITIN, TIBC, IRON, RETICCTPCT in the last 72 hours. Urinalysis    Component Value Date/Time   COLORURINE YELLOW (A) 03/17/2022 1328   APPEARANCEUR CLOUDY (A) 03/17/2022 1328   APPEARANCEUR Clear 09/21/2014 1319   LABSPEC 1.021 03/17/2022 1328   LABSPEC 1.021 09/21/2014 1319   PHURINE 5.0 03/17/2022 1328   GLUCOSEU NEGATIVE 03/17/2022 1328   GLUCOSEU Negative 09/21/2014 1319   HGBUR LARGE (A) 03/17/2022 1328   BILIRUBINUR NEGATIVE 03/17/2022 1328   BILIRUBINUR Negative 09/21/2014 1319   KETONESUR NEGATIVE 03/17/2022 1328   PROTEINUR 100 (A) 03/17/2022 1328   NITRITE NEGATIVE 03/17/2022 1328   LEUKOCYTESUR LARGE (A) 03/17/2022 1328   LEUKOCYTESUR Negative 09/21/2014 1319   Sepsis Labs Recent Labs  Lab 05/14/24 0524 05/16/24  9464 05/17/24 0521 05/18/24 0357  WBC 11.5* 8.6 9.7  10.6*   Microbiology Recent Results (from the past 240 hours)  Culture, Respiratory w Gram Stain     Status: None   Collection Time: 05/10/24  3:29 PM   Specimen: Bronchoalveolar Lavage; Respiratory  Result Value Ref Range Status   Specimen Description   Final    BRONCHIAL ALVEOLAR LAVAGE Performed at [facility], [address] [place], [place] [address]    Special Requests RML  Final   Gram Stain   Final    RARE WBC PRESENT, PREDOMINANTLY PMN RARE GRAM POSITIVE COCCI    Culture   Final    Normal respiratory flora-no Staph aureus or Pseudomonas seen Performed at [facility], [address], [place], [place] [address]    Report Status 05/13/2024 FINAL  Final  Acid Fast Smear (AFB)     Status: None   Collection Time: 05/10/24  3:29 PM   Specimen: Bronchoalveolar Lavage; Respiratory  Result Value Ref Range Status   AFB Specimen Processing Concentration  Final   Acid Fast Smear Negative  Final    Comment: (NOTE) Performed At: [facility] [address] [place], [place] [id number] Jennette Shorter MD Ey:[id number]    Source (AFB) BRONCHIAL ALVEOLAR LAVAGE  Final    Comment: Performed at [facility], [address] [place] [place], [place] [address]  Fungus Culture With Stain     Status: Abnormal   Collection Time: 05/10/24  3:29 PM   Specimen: Bronchial Alveolar Lavage; Respiratory  Result Value Ref Range Status   Fungus Stain Final report  Final   Fungus (Mycology) Culture Preliminary report (A)  Final    Comment: (NOTE) Performed At: [facility] [address] [place], [place] [id number] Jennette Shorter MD Ey:[id number]    Fungal Source BRONCHIAL ALVEOLAR LAVAGE  Final    Comment: Performed at [facility], [address] [place], [place] [address]  Fungus Culture Result     Status: None   Collection Time: 05/10/24  3:29 PM  Result Value Ref Range Status   Result 1 Comment  Final    Comment: (NOTE) KOH/Calcofluor  preparation:  no fungus observed. Performed At: [facility] [name] [place], [place] [id number] Jennette Shorter MD Ey:[phone]   Fungal organism reflex     Status: Abnormal   Collection Time: 05/10/24  3:29 PM  Result Value Ref Range Status   Fungal result 1 Candida albicans (A)  Final    Comment: (NOTE) Scant growth Performed At: [facility] [address] [place], [place] [id number] Jennette Shorter MD Ey:[id number]    Imaging DG Chest Port 1 View Result Date: 05/10/2024 CLINICAL DATA:  Status post bronchoscopy skip chest x-ray 05/05/2024. EXAM: PORTABLE CHEST 1 VIEW COMPARISON:  CT of the chest 05/08/2024.  Chest x-ray 05/05/2024. FINDINGS: Complete opacification of the right mid and upper lung with cavitary lesion again seen. The there are increasing diffuse interstitial opacities bilaterally. Costophrenic angles are clear. No pneumothorax visualized. Cardiomediastinal silhouette is within normal limits. The osseous structures are stable. IMPRESSION: 1. Complete opacification of the right mid and upper lung with cavitary lesion again seen. 2. Increasing diffuse interstitial opacities bilaterally worrisome for edema. Electronically Signed   By: Greig Pique M.D.   On: 05/10/2024 16:59   ECHOCARDIOGRAM COMPLETE Result Date: 05/10/2024    ECHOCARDIOGRAM REPORT   Patient Name:   Timothy [place] Date of Exam: 05/10/2024 Medical Rec #:  [id number]  Height:       70.0 in Accession #:    [id number]        Weight:       159.4 lb Date of Birth:  [birth year]          BSA:          1.895 m Patient Age:    77 years          BP:           142/90 mmHg Patient Gender: M                 HR:           81 bpm. Exam Location:  [facility] Procedure: 2D Echo, Cardiac Doppler, Color Doppler and Strain Analysis (Both            Spectral and Color Flow Doppler were utilized during procedure). Indications:     Congestive heart failure I50.9  History:         Patient has prior history of  Echocardiogram examinations, most                  recent 10/24/2020. Risk Factors:Hypertension. Emphysema lung.  Sonographer:     Christopher Furnace Referring Phys:  [id number] DARRIN BARN Diagnosing Phys: Deatrice Cage MD  Sonographer Comments: Global longitudinal strain was attempted. IMPRESSIONS  1. Left ventricular ejection fraction, by estimation, is 60 to 65%. The left ventricle has normal function. The left ventricle has no regional wall motion abnormalities. There is moderate left ventricular hypertrophy. Left ventricular diastolic parameters are consistent with Grade I diastolic dysfunction (impaired relaxation).  2. Right ventricular systolic function is normal. The right ventricular size is normal. Tricuspid regurgitation signal is inadequate for assessing PA pressure.  3. Left atrial size was mildly dilated.  4. The mitral valve is normal in structure. No evidence of mitral valve regurgitation. No evidence of mitral stenosis.  5. The aortic valve is tricuspid. Aortic valve regurgitation is not visualized. Aortic valve sclerosis/calcification is present, without any evidence of aortic stenosis. FINDINGS  Left Ventricle: Left ventricular ejection fraction, by estimation, is 60 to 65%. The left ventricle has normal function. The left ventricle has no regional wall motion abnormalities. Strain was performed and the global longitudinal strain is indeterminate. Global longitudinal strain performed but not reported based on interpreter judgement due to suboptimal tracking. The left ventricular internal cavity size was normal in size. There is moderate left ventricular hypertrophy. Left ventricular  diastolic parameters are consistent with Grade I diastolic dysfunction (impaired relaxation). Right Ventricle: The right ventricular size is normal. No increase in right ventricular wall thickness. Right ventricular systolic function is normal. Tricuspid regurgitation signal is inadequate for assessing PA pressure.  Left Atrium: Left atrial size was mildly dilated. Right Atrium: Right atrial size was normal in size. Pericardium: There is no evidence of pericardial effusion. Mitral Valve: The mitral valve is normal in structure. No evidence of mitral valve regurgitation. No evidence of mitral valve stenosis. MV peak gradient, 4.4 mmHg. The mean mitral valve gradient is 2.0 mmHg. Tricuspid Valve: The tricuspid valve is normal in structure. Tricuspid valve regurgitation is not demonstrated. No evidence of tricuspid stenosis. Aortic Valve: The aortic valve is tricuspid. Aortic valve regurgitation is not visualized. Aortic valve sclerosis/calcification is present, without any evidence of aortic stenosis. Aortic valve mean gradient measures 3.5 mmHg. Aortic valve peak gradient measures 6.1 mmHg. Aortic valve area, by VTI measures 4.04 cm. Pulmonic Valve: The pulmonic valve was normal  in structure. Pulmonic valve regurgitation is mild. No evidence of pulmonic stenosis. Aorta: The aortic root is normal in size and structure. Venous: The inferior vena cava was not well visualized. IAS/Shunts: No atrial level shunt detected by color flow Doppler.  LEFT VENTRICLE PLAX 2D LVIDd:         3.70 cm   Diastology LVIDs:         2.50 cm   LV e' medial:    7.18 cm/s LV PW:         1.40 cm   LV E/e' medial:  9.4 LV IVS:        1.40 cm   LV e' lateral:   9.25 cm/s LVOT diam:     2.30 cm   LV E/e' lateral: 7.3 LV SV:         76 LV SV Index:   40 LVOT Area:     4.15 cm  RIGHT VENTRICLE RV Basal diam:  2.40 cm RV Mid diam:    2.60 cm RV S prime:     16.00 cm/s TAPSE (M-mode): 2.1 cm LEFT ATRIUM             Index        RIGHT ATRIUM          Index LA diam:        4.80 cm 2.53 cm/m   RA Area:     6.42 cm LA Vol (A2C):   31.2 ml 16.46 ml/m  RA Volume:   9.67 ml  5.10 ml/m LA Vol (A4C):   26.3 ml 13.88 ml/m LA Biplane Vol: 31.1 ml 16.41 ml/m  AORTIC VALVE AV Area (Vmax):    3.63 cm AV Area (Vmean):   3.37 cm AV Area (VTI):     4.04 cm AV Vmax:            123.50 cm/s AV Vmean:          82.950 cm/s AV VTI:            0.187 m AV Peak Grad:      6.1 mmHg AV Mean Grad:      3.5 mmHg LVOT Vmax:         108.00 cm/s LVOT Vmean:        67.300 cm/s LVOT VTI:          0.182 m LVOT/AV VTI ratio: 0.97  AORTA Ao Root diam: 3.20 cm MITRAL VALVE                TRICUSPID VALVE MV Area (PHT): 2.91 cm     TR Peak grad:   10.8 mmHg MV Area VTI:   3.11 cm     TR Vmax:        164.00 cm/s MV Peak grad:  4.4 mmHg MV Mean grad:  2.0 mmHg     SHUNTS MV Vmax:       1.05 m/s     Systemic VTI:  0.18 m MV Vmean:      64.4 cm/s    Systemic Diam: 2.30 cm MV Decel Time: 261 msec MV E velocity: 67.60 cm/s MV A velocity: 106.00 cm/s MV E/A ratio:  0.64 Deatrice Cage MD Electronically signed by Deatrice Cage MD Signature Date/Time: 05/10/2024/3:00:56 PM    Final       Time coordinating discharge: over 30 minutes  SIGNED:  Walfred Bettendorf DO [facility]

## 2024-05-20 NOTE — Progress Notes (Signed)
 Mobility Specialist - Progress Note   05/20/24 1551  Mobility  Activity Ambulated with assistance  Level of Assistance Minimal assist, patient does 75% or more  Assistive Device Front wheel walker  Distance Ambulated (ft) 12 ft  Activity Response Tolerated well  Mobility visit 1 Mobility  Mobility Specialist Start Time (ACUTE ONLY) 1436  Mobility Specialist Stop Time (ACUTE ONLY) 1520  Mobility Specialist Time Calculation (min) (ACUTE ONLY) 44 min   Pt semi fowler upon entry, utilizing 3L. Pt completed bed mob ModA, STS to RW and amb to the doorway CGA-MinA--- requiring more assist with increased distance. Pt has active urination while standing at the doorway. Pt sat in the [facility], wheeled to the medical mall and outside for leisure. Pt returned to the room, transferred to bed via SPT MinA. Pt left semi fowler with alarm set and needs within reach.   America Silvan Mobility Specialist 05/20/24 4:16 PM

## 2024-05-20 NOTE — TOC Progression Note (Signed)
 Transition of Care [facility]) - Progression Note    Patient Details  Name: Timothy Matthews MRN: [id number] Date of Birth: 28-Sep-1946  Transition [facility] CM/SW Contact  Dalia GORMAN Fuse, RN Phone Number: 05/20/2024, 10:17 AM  Clinical Narrative:    TOC received a call from [place] with [place], [place]. They anticipate a bed will be available today and will let us  know once once is available. TOC sent requested progress not to [email or web address] for the [facility] to review.   TOC will continue to follow.                     Expected Discharge Plan and Services         Expected Discharge Date: 05/18/24                                     Social Drivers of Health (SDOH) Interventions SDOH Screenings   Food Insecurity: No Food Insecurity (05/06/2024)  Housing: Low Risk  (05/06/2024)  Transportation Needs: Unmet Transportation Needs (05/06/2024)  Utilities: Not At Risk (05/06/2024)  Social Connections: Moderately Integrated (05/06/2024)  Tobacco Use: High Risk (05/05/2024)    Readmission Risk Interventions     No data to display

## 2024-05-23 ENCOUNTER — Other Ambulatory Visit: Payer: Self-pay | Admitting: Nurse Practitioner

## 2024-05-23 DIAGNOSIS — C342 Malignant neoplasm of middle lobe, bronchus or lung: Secondary | ICD-10-CM

## 2024-06-08 ENCOUNTER — Ambulatory Visit

## 2024-06-09 LAB — FUNGUS CULTURE WITH STAIN

## 2024-06-09 LAB — FUNGAL ORGANISM REFLEX

## 2024-06-09 LAB — FUNGUS CULTURE RESULT

## 2024-06-19 LAB — MTB-RIF NAA WITH AFB CULTURE, SPUTUM
Acid Fast Culture: NEGATIVE
Myco tuberculosis Complex: NOT DETECTED

## 2024-06-25 LAB — ACID FAST CULTURE WITH REFLEXED SENSITIVITIES (MYCOBACTERIA): Acid Fast Culture: NEGATIVE

## 2024-08-13 DEATH — deceased
